# Patient Record
Sex: Female | Born: 1980 | Race: White | Hispanic: No | Marital: Married | Smoking: Never smoker
Health system: Southern US, Community
[De-identification: ages and names within clinical notes are randomized; demographics above are authoritative.]

## PROBLEM LIST (undated history)

## (undated) DIAGNOSIS — I10 Essential (primary) hypertension: Secondary | ICD-10-CM

## (undated) DIAGNOSIS — R29818 Other symptoms and signs involving the nervous system: Secondary | ICD-10-CM

## (undated) DIAGNOSIS — R519 Headache, unspecified: Secondary | ICD-10-CM

## (undated) DIAGNOSIS — N938 Other specified abnormal uterine and vaginal bleeding: Secondary | ICD-10-CM

## (undated) DIAGNOSIS — R51 Headache: Secondary | ICD-10-CM

## (undated) DIAGNOSIS — I639 Cerebral infarction, unspecified: Secondary | ICD-10-CM

## (undated) DIAGNOSIS — G43909 Migraine, unspecified, not intractable, without status migrainosus: Secondary | ICD-10-CM

## (undated) DIAGNOSIS — G890 Central pain syndrome: Secondary | ICD-10-CM

## (undated) HISTORY — PX: TUBAL LIGATION: SHX77

## (undated) HISTORY — DX: Headache: R51

## (undated) HISTORY — DX: Headache, unspecified: R51.9

---

## 2001-07-01 ENCOUNTER — Ambulatory Visit (HOSPITAL_COMMUNITY): Admission: RE | Admit: 2001-07-01 | Discharge: 2001-07-01 | Payer: Self-pay | Admitting: *Deleted

## 2001-07-01 ENCOUNTER — Encounter: Payer: Self-pay | Admitting: *Deleted

## 2001-10-12 ENCOUNTER — Ambulatory Visit (HOSPITAL_COMMUNITY): Admission: AD | Admit: 2001-10-12 | Discharge: 2001-10-12 | Payer: Self-pay | Admitting: *Deleted

## 2001-10-26 ENCOUNTER — Inpatient Hospital Stay (HOSPITAL_COMMUNITY): Admission: RE | Admit: 2001-10-26 | Discharge: 2001-10-28 | Payer: Self-pay | Admitting: *Deleted

## 2003-06-26 ENCOUNTER — Emergency Department (HOSPITAL_COMMUNITY): Admission: EM | Admit: 2003-06-26 | Discharge: 2003-06-26 | Payer: Self-pay | Admitting: *Deleted

## 2006-07-17 ENCOUNTER — Emergency Department (HOSPITAL_COMMUNITY): Admission: EM | Admit: 2006-07-17 | Discharge: 2006-07-17 | Payer: Self-pay | Admitting: Emergency Medicine

## 2006-12-28 ENCOUNTER — Emergency Department (HOSPITAL_COMMUNITY): Admission: EM | Admit: 2006-12-28 | Discharge: 2006-12-28 | Payer: Self-pay | Admitting: Emergency Medicine

## 2007-05-09 ENCOUNTER — Emergency Department (HOSPITAL_COMMUNITY): Admission: EM | Admit: 2007-05-09 | Discharge: 2007-05-09 | Payer: Self-pay | Admitting: Emergency Medicine

## 2008-10-31 ENCOUNTER — Emergency Department (HOSPITAL_COMMUNITY): Admission: EM | Admit: 2008-10-31 | Discharge: 2008-10-31 | Payer: Self-pay | Admitting: Emergency Medicine

## 2010-06-20 ENCOUNTER — Emergency Department (HOSPITAL_COMMUNITY)
Admission: EM | Admit: 2010-06-20 | Discharge: 2010-06-20 | Payer: Self-pay | Source: Home / Self Care | Admitting: Emergency Medicine

## 2010-06-28 ENCOUNTER — Emergency Department (HOSPITAL_COMMUNITY)
Admission: EM | Admit: 2010-06-28 | Discharge: 2010-06-28 | Payer: Self-pay | Source: Home / Self Care | Admitting: Emergency Medicine

## 2010-11-30 ENCOUNTER — Emergency Department (HOSPITAL_COMMUNITY)
Admission: EM | Admit: 2010-11-30 | Discharge: 2010-11-30 | Disposition: A | Discharge status: Home / Self Care | Payer: Self-pay | Attending: Emergency Medicine | Admitting: Emergency Medicine

## 2010-11-30 DIAGNOSIS — K089 Disorder of teeth and supporting structures, unspecified: Secondary | ICD-10-CM | POA: Insufficient documentation

## 2010-11-30 DIAGNOSIS — K029 Dental caries, unspecified: Secondary | ICD-10-CM | POA: Insufficient documentation

## 2010-12-07 NOTE — H&P (Signed)
[facility]  Patient:    Nicole Travis, Nicole Travis Visit Number: [id number] MRN: [id number]          Service Type: OBS Location: 4A A417 01 Attending Physician:  Jeri Cos. Dictated by:   Langley Gauss, M.D. Admit Date:  10/26/2001 Discharge Date: 10/28/2001                           History and Physical  CHIEF COMPLAINT:  This is a 30 year old gravida 2 para 1, at [redacted] weeks gestation, with previous low transverse cesarean section, who presents to [facility] complaining of rupture of membranes with clear amniotic fluid at 0600 and onset of uterine contractions shortly thereafter.  HISTORY OF PRESENT ILLNESS:  The patients prenatal course has otherwise been uncomplicated.  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  ALLERGIES:  No known drug allergies.  CURRENT MEDICATIONS:  Prenatal vitamins.  PAST OBSTETRICAL HISTORY:  Pertinent for July 23, 2000 failure to progress with primary low transverse cesarean section performed, delivered 6 pound 12 ounce infant.  The patient states that at that operative procedure the epidural was not functional.  Likewise, a spinal was not functional.  The patient required general anesthesia.  PAST MEDICAL HISTORY:  No other past medical history.  PHYSICAL EXAMINATION:  GENERAL:  Obese white female in moderate distress, with occurrence of uterine contractions.  VITAL SIGNS:  Height 5 feet 2 inches.  Weight 245 pounds.  Blood pressure 142/80, pulse rate 80, respiratory rate 20.  HEENT:  Negative.  Mucous membranes moist.  NECK:  No adenopathy.  Neck supple.  Thyroid not palpable.  LUNGS:  Clear.  CARDIOVASCULAR:  Regular rate and rhythm.  ABDOMEN:  Soft, nontender.  Fundal height 38 cm appreciated.  She is vertex presentation by Leopolds maneuver.  SKIN:  The  cutaneous Candidiasis is noted to be just underneath the panniculus with slight odor identified.  PELVIC:  Clear leakage of amniotic fluid noted.  Cervix noted to be 3 cm dilated.  Vertex, however, is not engaged.  External fetal monitor reveals contractions occurring q.16minutes, with a reassuring fetal heart rate and accelerations noted.  ASSESSMENT:  Intrauterine pregnancy at 39 weeks with prior low transverse cesarean section, now presenting with rupture of membranes and early stages of active labor.  PLAN:  The patient is at risk for uterine dehiscence and/or uterine rupture secondary to to the presence of the previous uterine incision.  Thus, the patient is treated initially with subcu terbutaline with only marginal results, with uterine contractions only being suppressed for 30 minutes duration.  Thus, with the return of onset of contractions and potential for uterine rupture, the decision was made to proceed with repeat cesarean section with intraoperative tubal ligation in emergent, though not stat, classification. Dictated by:   Langley Gauss, M.D. Attending Physician:  Jeri Cos. DD:  10/28/01 TD:  10/29/01 Job: [id number]

## 2010-12-07 NOTE — Discharge Summary (Signed)
[facility]  Patient:    Nicole Travis, Nicole Travis Visit Number: [id number] MRN: [id number]          Service Type: OBS Location: 4A A417 01 Attending Physician:  Jeri Cos. Dictated by:   Langley Gauss, M.D. Admit Date:  10/26/2001 Discharge Date: 10/28/2001                             Discharge Summary  DISCHARGE DIAGNOSIS:  A 39 week intrauterine pregnancy with previous low transverse cesarean section.  BRIEF HISTORY:  The patient presents with spontaneous rupture of membranes and early labor.  Intraoperative permanent sterilization was also performed by Dr. Roylene Reason. Lisette Grinder.  The patient desired permanent sterilization.  DISPOSITION:  The patient is to follow up in 1 days time for staple removal from a Pfannenstiel incision.  She is given copies of standardized discharge instructions.  She will likewise continue to use the Nizoral cream on a cutaneous Candidiasis at the site of the incision.  PERTINENT LABORATORY DATA:  Admission hemoglobin and hematocrit 11.2/31.6.  On postoperatively day #1 8.8/24.6.  HOSPITAL COURSE:  The patient had been scheduled for repeat C-section on October 28, 2001, however, she presented on October 26, 2001, with rupture of membranes and active contractions.  Due to the presence of a previous uterine scar and active contractions the patient was initially treated with subcutaneous terbutaline, however, this failed to provide any significant cessation of uterine activity.  After a 30 minute time period the contractions had recurred and returned to their previous frequency and intensity at about q.3-5 minutes and of moderate intensity.  Changes were thus required in the operating room schedule.  I contacted Dr. Franky Macho and advised again that we would like to progress with the surgical procedure of the repeat C-section prior to his cholecystectomy.  He was, however, advised that this is not to be considered a stat but  rather an emergent type procedure due to the presence of the uterine contractions.  The patient thus underwent repeat C-section on October 26, 2001.  The procedure was complicated by failure to achieve adequate spinal analgesic thus the patient was operated on under general anesthesia.  Postoperatively the patient did well.  Did have a JP catheter and a Foley catheter in place postoperatively.  She was able to advance her diet and tolerate a regular general diet and have normal resumption of bowel function. She remained afebrile.  Vital signs remained stable.  On postoperative day #2, October 28, 2001, the patient is now tolerating a regular general diet.  She does well with p.o. Tylox for pain relief.  She has had resumption of bowel and bladder function.  The JP drain is removed on the p.m. of October 28, 2001, with markedly decreased drainage over the past 48 hours.  The patient is noted to continue to have persistence of the cutaneous candidiasis at the site of the incision due to the overlying ______. The patient is discharged home on October 28, 2001.  An infant circumcision was likewise performed on Baby Boy Myrtie Soman on October 28, 2001. Dictated by:   Langley Gauss, M.D. Attending Physician:  Jeri Cos. DD:  10/28/01 TD:  10/29/01 Job: [id number]

## 2010-12-07 NOTE — Op Note (Signed)
[facility]  Patient:    Nicole Travis, Nicole Travis Visit Number: [id number] MRN: [id number]          Service Type: OBS Location: 4A A417 01 Attending Physician:  Jeri Cos. Dictated by:   Langley Gauss, M.D. Proc. Date: 10/26/01 Admit Date:  10/26/2001                             Operative Report  PREOPERATIVE DIAGNOSES: 1. A 39-week intrauterine pregnancy with spontaneous rupture of membranes and    labor. 2. Previous low transverse cesarean section. 3. The patient desires repeat cesarean section with permanent sterilization.  PROCEDURE PERFORMED: 1. Repeat low transverse cesarean section with delivery of an 8-pound    1.6-ounce female infant. 2. Intraoperative bilateral modified Pomeroy tubal ligation.  SURGEON:  Langley Gauss, M.D.  ESTIMATED BLOOD LOSS:  1000 cc.  ANALGESIA/ANESTHESIA:  Failed multiple attempts at spinal per the anesthesia staff.  The patient required induction of general endotracheal anesthesia.  SPECIMEN:  Arterial cord gas and cord blood to pathology.  The placenta was examined and noted to be apparently intact with a three-vessel umbilical cord.  PEDIATRICIAN:  Simone Curia, M.D.  SUMMARY:  The patient presented to [facility] with ruptured membranes in early stages of active labor with contractions occurring q.3 minutes.  The patient originally was scheduled for a repeat cesarean section on October 28, 2001.  I evaluated the patient at 0830.  She was noted to have spontaneous rupture of membranes, contracting q.3-5 minutes, dilated 3 cm.  Preparations were made to proceed with repeat cesarean section.  The patient was treated with 0.5 mg of subcu terbutaline.  This resolved the uterine contractions for only a short period of time.  After 30 minutes, the patient again had onset of painful uterine contractions.  I discussed the patient with the operating room coordinator, and was told that a start time would be  around 10 a.m.  After all the preparations were made and the room was available, the patient was taken to the operating room, at which time anesthesia unsuccessfully tried multiple attempts to place a spinal analgesic.  The patient was in the left lateral decubitus position.  She would not agree to the sitting up position for a trial of spinal placement.  The patient thus was advised that we were required to proceed with general anesthesia.  She was placed on the OR table,  right and left lateral tilts, prepped and draped in the usual sterile manner.  A sharp knife was used to incise the Pfannenstiel incision through the skin at the previous C-section site.  We progressed down to the fascial plane utilizing a sharp knife, cauterizing all bleeders along the way.  The fascia was then incised in a transverse curvilinear manner, utilizing the Mayo scissors while sharply dissecting the underlying rectus muscles.  The fascial edges were then grasped using straight Kocher clamps and the fascia separating off the underlying rectus muscle in the midline utilizing the Mayo scissors. Once this was done both superiorly and inferiorly, the rectus muscles were then bluntly separated.  The peritoneal cavity was atraumatically bluntly entered at the superior most portion of the incision.  The peritoneal incision was extended superiorly and inferiorly.  We directly visualized the bladder to avoid its accidental entry.  A bladder blade was then placed.  Lower uterine segment was identified.  A bladder flap was then created from the vesicle uterine fold  utilizing sharp dissection with the Mayo scissors.  Blunt dissection then resulted in a bladder flap being mobilized.  A sharp knife was then used to score a low transverse uterine incision, and the amniotic sac was encountered in the midline.  Clear amniotic fluid was noted.  My index finger was then used to bluntly extend the uterine incision bilaterally.   Clear amniotic fluid was noted.  My right hand then reached into the uterine cavity. The head of the infant was flexed and elevated.  The reusuable suction was then connected to wall suction.  I discussed this previously with the nursing staff that we would like this delivered in this manner.  The suction cup was then placed.  Gentle suction was applied.  However, this was unsuccessful in delivery, so as the suction had been improperly assembled, resulting in leakage.  Several attempts were repeated with different suctions being made, however, we were unsuccessful in getting this piece of operating equipment to work.  Therefore, I reached my right hand into the uterine cavity and the head of the infant was flexed and elevated.  Several attempts with fundal pressure from above.  The head was noted on its delivery atraumatically through the uterine incision without difficulty.  Mouth and nares were bulb suctioned of clear amniotic fluid.  Nuchal cord x1 was reduced.  Gentle traction resulted in delivery of the remaining infant through the incision without difficulty. The umbilical cord was then moved toward the infant.  The cord was doubly clamped and cut.  The infant was handed to the waiting pediatrician, Dr. Simone Curia.  There was noted to be a spontaneous and vigorous baby cry.  Arterial blood gas and cord blood were then from the umbilical cord. Gentle traction on the umbilical cord resulted in separation which appeared to be a very large intact three-vessel cord.  This was discarded.  The uterus then exteriorized.  Intrauterine exploration revealed no retained placental fragments.  The uterine incision was thus closed in two layers with #0 Chromic in a running-locked fashion, second layer beginning in imbricating layer. This resulted in excellent hemostasis.  The cul-de-sac was then irrigated.  We then began with a tubal ligation.  The tubes and ovaries were noted to be normal  bilaterally.  Each of the tubes was grasped in its midportion.  Two ties of #1 plain sutures were then placed at the base of the loop of tube  formed, which allowed sharp excision of a portion of right and left fallopian tubes.  A Polysorb suture was placed to the left fallopian tube for positive identification.  The pedicles from the tubal ligation were noted to be dry and secure.  The uterus was then returned to the pelvic cavity.  Sponge, needle, and instrument counts were correct at this time.  Peritoneal edges were grasped using Kelly clamps, and the peritoneum was closed with a continuous running #0 Chromic suture.  The rectus muscles likewise were reapproximated in the midline utilizing continuous running #0 Chromic suture.  No subfascial leaks were cauterized.  The fascia was then closed with a double-stranded looped #1 PDS suture.  The subcutaneous bleeders were cauterized.  A JP drain was placed in the subcutaneous space with a separate exit wound to the right apex of the incision.  This was sutured in place, three #1 Maxon sutures were then placed through and through the skin edges to facilitate reapproximation of the skin in the midline.  The skin was then completely closed utilizing skin  staples.  The patient tolerated the procedure well.  To facilitate postoperative analgesia a total of 30 cc of 0.5% bupivacaine was injected subcutaneously through a small skin wheal.  The patient tolerated this procedure very well.  She continued to drain clear yellow urine, and she was taken to the recovery room in stable condition at which time operative findings were discussed with the patients awaiting family. Dictated by:   Langley Gauss, M.D. Attending Physician:  Jeri Cos. DD:  10/26/01 TD:  10/27/01 Job: [id number]

## 2011-05-09 LAB — STREP A DNA PROBE: Group A Strep Probe: NEGATIVE

## 2011-10-01 ENCOUNTER — Other Ambulatory Visit (HOSPITAL_COMMUNITY)
Admission: RE | Admit: 2011-10-01 | Discharge: 2011-10-01 | Disposition: A | Discharge status: Home / Self Care | Payer: Self-pay | Source: Ambulatory Visit | Attending: Unknown Physician Specialty | Admitting: Unknown Physician Specialty

## 2011-10-01 ENCOUNTER — Other Ambulatory Visit: Payer: Self-pay | Admitting: Nurse Practitioner

## 2011-10-01 DIAGNOSIS — R87619 Unspecified abnormal cytological findings in specimens from cervix uteri: Secondary | ICD-10-CM | POA: Insufficient documentation

## 2011-10-01 DIAGNOSIS — R87610 Atypical squamous cells of undetermined significance on cytologic smear of cervix (ASC-US): Secondary | ICD-10-CM | POA: Insufficient documentation

## 2012-01-10 ENCOUNTER — Emergency Department (HOSPITAL_COMMUNITY)
Admission: EM | Admit: 2012-01-10 | Discharge: 2012-01-10 | Disposition: A | Discharge status: Home / Self Care | Payer: Self-pay | Attending: Emergency Medicine | Admitting: Emergency Medicine

## 2012-01-10 ENCOUNTER — Encounter (HOSPITAL_COMMUNITY): Payer: Self-pay | Admitting: *Deleted

## 2012-01-10 DIAGNOSIS — I10 Essential (primary) hypertension: Secondary | ICD-10-CM | POA: Insufficient documentation

## 2012-01-10 DIAGNOSIS — M791 Myalgia, unspecified site: Secondary | ICD-10-CM

## 2012-01-10 DIAGNOSIS — M25519 Pain in unspecified shoulder: Secondary | ICD-10-CM | POA: Insufficient documentation

## 2012-01-10 HISTORY — DX: Essential (primary) hypertension: I10

## 2012-01-10 MED ORDER — CYCLOBENZAPRINE HCL 10 MG PO TABS
10.0000 mg | ORAL_TABLET | Freq: Once | ORAL | Status: AC
  Administered 2012-01-10: 10 mg via ORAL
  Filled 2012-01-10: qty 1

## 2012-01-10 MED ORDER — CYCLOBENZAPRINE HCL 10 MG PO TABS: 10.0000 mg | ORAL_TABLET | Freq: Two times a day (BID) | ORAL | Status: AC | PRN

## 2012-01-10 MED ORDER — IBUPROFEN 800 MG PO TABS
800.0000 mg | ORAL_TABLET | Freq: Once | ORAL | Status: AC
  Administered 2012-01-10: 800 mg via ORAL
  Filled 2012-01-10: qty 1

## 2012-01-10 MED ORDER — IBUPROFEN 800 MG PO TABS: 800.0000 mg | ORAL_TABLET | Freq: Three times a day (TID) | ORAL | Status: AC

## 2012-01-10 NOTE — ED Notes (Signed)
Pain to back of right shoulder since Monday.  States pain has gotten worse and causes her side to seize up.

## 2012-01-10 NOTE — ED Provider Notes (Signed)
History     CSN: [id number]  Arrival date & time 01/10/12  4098   First MD Initiated Contact with Patient 01/10/12 0559      Chief Complaint  Patient presents with  . Shoulder Pain    (Consider location/radiation/quality/duration/timing/severity/associated sxs/prior treatment) HPI Nicole Travis is a 31 y.o. female who presents to the Emergency Department complaining of right sub scapular pain that she had when she woke up on Monday. She is not aware of any activity or injury. She has used aleve without relief. Past Medical History  Diagnosis Date  . Hypertension     Past Surgical History  Procedure Date  . Cesarean section   . Tubal ligation     History reviewed. No pertinent family history.  History  Substance Use Topics  . Smoking status: Never Smoker   . Smokeless tobacco: Not on file  . Alcohol Use: No    OB History    Grav Para Term Preterm Abortions TAB SAB Ect Mult Living                  Review of Systems  Constitutional: Negative for fever.       10 Systems reviewed and are negative for acute change except as noted in the HPI.  HENT: Negative for congestion.   Eyes: Negative for discharge and redness.  Respiratory: Negative for cough and shortness of breath.   Cardiovascular: Negative for chest pain.  Gastrointestinal: Negative for vomiting and abdominal pain.  Musculoskeletal: Negative for back pain.       Right upper back pain  Skin: Negative for rash.  Neurological: Negative for syncope, numbness and headaches.  Psychiatric/Behavioral:       No behavior change.    Allergies  Review of patient's allergies indicates no known allergies.  Home Medications   Current Outpatient Rx  Name Route Sig Dispense Refill  . LISINOPRIL 10 MG PO TABS Oral Take 10 mg by mouth daily.      BP 165/98  Pulse 57  Temp 98 F (36.7 C) (Oral)  Resp 20  Ht 5\' 1"  (1.549 m)  Wt 245 lb (111.131 kg)  BMI 46.29 kg/m2  SpO2 98%  LMP 12/09/2011  Physical  Exam  Nursing note and vitals reviewed. Constitutional:       Awake, alert, nontoxic appearance.  HENT:  Head: Atraumatic.  Eyes: Right eye exhibits no discharge. Left eye exhibits no discharge.  Neck: Neck supple.  Pulmonary/Chest: Effort normal. She exhibits no tenderness.  Abdominal: Soft. There is no tenderness. There is no rebound.  Musculoskeletal: She exhibits no tenderness.       Baseline ROM, no obvious new focal weakness.Focal tenderness to palpation at right sub scapular area.   Neurological:       Mental status and motor strength appears baseline for patient and situation.  Skin: No rash noted.  Psychiatric: She has a normal mood and affect.    ED Course  Procedures (including critical care time)     MDM  Patient with pain to the right sub scapular area since Monday with no known injury. Given antiinflammatory and muscle relaxant. Pt stable in ED with no significant deterioration in condition.The patient appears reasonably screened and/or stabilized for discharge and I doubt any other medical condition or other [facility] requiring further screening, evaluation, or treatment in the ED at this time prior to discharge.  MDM Reviewed: nursing note and vitals           Nicoletta Dress.  Colon Branch, MD 01/10/12 [phone]

## 2012-01-10 NOTE — Discharge Instructions (Signed)
Apply heat to the area for comfort. Use the ibuprofen and muscle relaxant.

## 2013-01-24 ENCOUNTER — Encounter (HOSPITAL_COMMUNITY): Payer: Self-pay | Admitting: *Deleted

## 2013-01-24 ENCOUNTER — Emergency Department (HOSPITAL_COMMUNITY): Payer: Self-pay

## 2013-01-24 ENCOUNTER — Emergency Department (HOSPITAL_COMMUNITY)
Admission: EM | Admit: 2013-01-24 | Discharge: 2013-01-24 | Disposition: A | Discharge status: Home / Self Care | Payer: Self-pay | Attending: Emergency Medicine | Admitting: Emergency Medicine

## 2013-01-24 DIAGNOSIS — I10 Essential (primary) hypertension: Secondary | ICD-10-CM | POA: Insufficient documentation

## 2013-01-24 DIAGNOSIS — R52 Pain, unspecified: Secondary | ICD-10-CM | POA: Insufficient documentation

## 2013-01-24 DIAGNOSIS — J069 Acute upper respiratory infection, unspecified: Secondary | ICD-10-CM | POA: Insufficient documentation

## 2013-01-24 DIAGNOSIS — R0602 Shortness of breath: Secondary | ICD-10-CM | POA: Insufficient documentation

## 2013-01-24 DIAGNOSIS — Z79899 Other long term (current) drug therapy: Secondary | ICD-10-CM | POA: Insufficient documentation

## 2013-01-24 DIAGNOSIS — J3489 Other specified disorders of nose and nasal sinuses: Secondary | ICD-10-CM | POA: Insufficient documentation

## 2013-01-24 DIAGNOSIS — R0789 Other chest pain: Secondary | ICD-10-CM | POA: Insufficient documentation

## 2013-01-24 DIAGNOSIS — J40 Bronchitis, not specified as acute or chronic: Secondary | ICD-10-CM | POA: Insufficient documentation

## 2013-01-24 DIAGNOSIS — M2550 Pain in unspecified joint: Secondary | ICD-10-CM | POA: Insufficient documentation

## 2013-01-24 MED ORDER — ALBUTEROL SULFATE HFA 108 (90 BASE) MCG/ACT IN AERS
2.0000 | INHALATION_SPRAY | Freq: Four times a day (QID) | RESPIRATORY_TRACT | Status: DC
  Administered 2013-01-24: 2 via RESPIRATORY_TRACT
  Filled 2013-01-24: qty 6.7

## 2013-01-24 MED ORDER — MUCINEX DM 30-600 MG PO TB12: 1.0000 | ORAL_TABLET | Freq: Two times a day (BID) | ORAL | Status: DC

## 2013-01-24 NOTE — ED Notes (Signed)
MD at bedside. 

## 2013-01-24 NOTE — ED Notes (Signed)
Pt with difficulty breathing for last several days per pt, + NP cough, + CP heaviness, denies N/V, mid CP

## 2013-01-24 NOTE — ED Provider Notes (Addendum)
History    This chart was scribed for Shelda Jakes, MD, MD by Ashley Jacobs, ED Scribe. The patient was seen in room APA15/APA15 and the patient's care was started at 10:27 pm CSN: [id number] Arrival date & time 01/24/13  2045     Chief Complaint  Patient presents with  . Shortness of Breath  . Cough    The history is provided by the patient and medical records. No language interpreter was used.   HPI Comments: Nicole Travis is a 32 y.o. female who presents to the Emergency Department complaining of moderate chest tightness and sob with onset of 2 days PTA. Pt is also experiencing a NP intermittent cough. Pt reports taking tylenol sinus while at home but have no relief.  Nothing seems to worsen or relive pain. Pt had sick contact with sick infant daughter. Pt denies nausea and vomiting       Past Medical History  Diagnosis Date  . Hypertension    Past Surgical History  Procedure Laterality Date  . Cesarean section    . Tubal ligation     History reviewed. No pertinent family history. History  Substance Use Topics  . Smoking status: Never Smoker   . Smokeless tobacco: Not on file  . Alcohol Use: No   OB History   Grav Para Term Preterm Abortions TAB SAB Ect Mult Living                 Review of Systems  Constitutional: Negative for fever and chills.  HENT: Positive for congestion (started 2 hrs at arrival). Negative for sore throat, rhinorrhea, trouble swallowing and neck pain.   Respiratory: Positive for cough, chest tightness and shortness of breath.   Cardiovascular: Negative for chest pain.  Gastrointestinal: Negative for nausea, vomiting, abdominal pain, diarrhea, constipation and blood in stool.  Genitourinary: Negative for dysuria, hematuria and difficulty urinating.  Musculoskeletal: Positive for arthralgias.       Body aches  Skin: Negative for rash.    Allergies  Review of patient's allergies indicates no known allergies.  Home Medications    Current Outpatient Rx  Name  Route  Sig  Dispense  Refill  . pseudoephedrine-acetaminophen (TYLENOL SINUS) 30-500 MG TABS   Oral   Take 2 tablets by mouth every 6 (six) hours as needed. Cold/sinus         . Dextromethorphan-Guaifenesin (MUCINEX DM) 30-600 MG TB12   Oral   Take 1 tablet by mouth every 12 (twelve) hours.   28 each   0   . lisinopril (PRINIVIL,ZESTRIL) 10 MG tablet   Oral   Take 10 mg by mouth daily.          BP 154/104  Pulse 105  Temp(Src) 98.2 F (36.8 C) (Oral)  Resp 22  Ht 5\' 1"  (1.549 m)  Wt 245 lb (111.131 kg)  BMI 46.32 kg/m2  SpO2 98%  LMP 01/10/2013 Physical Exam  Nursing note and vitals reviewed. Constitutional: She is oriented to person, place, and time. She appears well-developed and well-nourished. No distress.  HENT:  Head: Normocephalic and atraumatic.  Right Ear: External ear normal.  Left Ear: External ear normal.  Mouth/Throat: Oropharynx is clear and moist.  Eyes: Conjunctivae and EOM are normal. Pupils are equal, round, and reactive to light.  Neck: Normal range of motion. Neck supple.  Cardiovascular: Normal rate, regular rhythm and normal heart sounds.   No murmur heard. Pulmonary/Chest: Effort normal and breath sounds normal. She has no  wheezes.  No rhonchi  Abdominal: Bowel sounds are normal. There is no tenderness.  Musculoskeletal: Normal range of motion. She exhibits no edema.  Lymphadenopathy:    She has no cervical adenopathy.  Neurological: She is alert and oriented to person, place, and time. No cranial nerve deficit. Coordination normal.  Skin: Skin is warm. No rash noted. She is not diaphoretic.  Psychiatric: She has a normal mood and affect. Her behavior is normal.    ED Course  Procedures (including critical care time) DIAGNOSTIC STUDIES: Oxygen Saturation is 98% on room air, normal by my interpretation.    COORDINATION OF CARE: 10:30 Discussed course of care with pt which includes DG chest views, ekg.  Pt understands and agrees.  Labs Reviewed - No data to display Dg Chest 2 View  01/24/2013   *RADIOLOGY REPORT*  Clinical Data: Shortness of breath, cough, congestion.  CHEST - 2 VIEW  Comparison: 05/09/2007  Findings: Heart and mediastinal contours are within normal limits. No focal opacities or effusions.  No acute bony abnormality.  IMPRESSION: No active cardiopulmonary disease.   Original Report Authenticated By: Charlett Nose, M.D.    Date: 01/24/2013  Rate: 102  Rhythm: sinus tachycardia  QRS Axis: normal  Intervals: normal  ST/T Wave abnormalities: normal  Conduction Disutrbances:none  Narrative Interpretation:   Old EKG Reviewed: none available      1. URI (upper respiratory infection)   2. Bronchitis     MDM  Patient no acute distress. Sig exposure home to child. Symptoms seem to be consistent with a viral upper respiratory infection with bronchitis. Chest x-rays negative for pneumonia. No wheezing present. We'll treat with albuterol inhaler and Mucinex DM.    I personally performed the services described in this documentation, which was scribed in my presence. The recorded information has been reviewed and is accurate.     Shelda Jakes, MD 01/24/13 2250  Shelda Jakes, MD 01/24/13 2251

## 2014-07-18 ENCOUNTER — Emergency Department (HOSPITAL_COMMUNITY): Payer: Commercial Managed Care - PPO

## 2014-07-18 ENCOUNTER — Encounter (HOSPITAL_COMMUNITY): Payer: Self-pay | Admitting: Emergency Medicine

## 2014-07-18 ENCOUNTER — Emergency Department (HOSPITAL_COMMUNITY)
Admission: EM | Admit: 2014-07-18 | Discharge: 2014-07-18 | Disposition: A | Discharge status: Home / Self Care | Payer: Commercial Managed Care - PPO | Attending: Emergency Medicine | Admitting: Emergency Medicine

## 2014-07-18 DIAGNOSIS — R000 Tachycardia, unspecified: Secondary | ICD-10-CM | POA: Diagnosis not present

## 2014-07-18 DIAGNOSIS — R6889 Other general symptoms and signs: Secondary | ICD-10-CM

## 2014-07-18 DIAGNOSIS — R509 Fever, unspecified: Secondary | ICD-10-CM | POA: Insufficient documentation

## 2014-07-18 DIAGNOSIS — R51 Headache: Secondary | ICD-10-CM | POA: Diagnosis not present

## 2014-07-18 DIAGNOSIS — I10 Essential (primary) hypertension: Secondary | ICD-10-CM | POA: Insufficient documentation

## 2014-07-18 DIAGNOSIS — R05 Cough: Secondary | ICD-10-CM | POA: Insufficient documentation

## 2014-07-18 DIAGNOSIS — R0981 Nasal congestion: Secondary | ICD-10-CM | POA: Diagnosis not present

## 2014-07-18 DIAGNOSIS — R5383 Other fatigue: Secondary | ICD-10-CM | POA: Insufficient documentation

## 2014-07-18 DIAGNOSIS — R0602 Shortness of breath: Secondary | ICD-10-CM | POA: Diagnosis present

## 2014-07-18 DIAGNOSIS — M791 Myalgia: Secondary | ICD-10-CM | POA: Insufficient documentation

## 2014-07-18 LAB — CBC
HCT: 42.3 % (ref 36.0–46.0)
Hemoglobin: 14 g/dL (ref 12.0–15.0)
MCH: 30.2 pg (ref 26.0–34.0)
MCHC: 33.1 g/dL (ref 30.0–36.0)
MCV: 91.4 fL (ref 78.0–100.0)
PLATELETS: 265 10*3/uL (ref 150–400)
RBC: 4.63 MIL/uL (ref 3.87–5.11)
RDW: 14.1 % (ref 11.5–15.5)
WBC: 9.1 10*3/uL (ref 4.0–10.5)

## 2014-07-18 LAB — COMPREHENSIVE METABOLIC PANEL
ALT: 19 U/L (ref 0–35)
AST: 23 U/L (ref 0–37)
Albumin: 4 g/dL (ref 3.5–5.2)
Alkaline Phosphatase: 78 U/L (ref 39–117)
Anion gap: 6 (ref 5–15)
BUN: 11 mg/dL (ref 6–23)
CALCIUM: 9.2 mg/dL (ref 8.4–10.5)
CO2: 30 mmol/L (ref 19–32)
Chloride: 103 mEq/L (ref 96–112)
Creatinine, Ser: 0.81 mg/dL (ref 0.50–1.10)
GFR calc Af Amer: >90 mL/min (ref >90)
Glucose, Bld: 107 mg/dL — ABNORMAL HIGH (ref 70–99)
Potassium: 3.8 mmol/L (ref 3.5–5.1)
SODIUM: 139 mmol/L (ref 135–145)
Total Bilirubin: 0.6 mg/dL (ref 0.3–1.2)
Total Protein: 7.8 g/dL (ref 6.0–8.3)

## 2014-07-18 MED ORDER — NAPROXEN 500 MG PO TABS: 500.0000 mg | ORAL_TABLET | Freq: Two times a day (BID) | ORAL | Status: DC

## 2014-07-18 MED ORDER — DM-GUAIFENESIN ER 30-600 MG PO TB12: 1.0000 | ORAL_TABLET | Freq: Two times a day (BID) | ORAL | Status: DC

## 2014-07-18 NOTE — ED Provider Notes (Signed)
CSN: [id number]     Arrival date & time 07/18/14  1447 History   First MD Initiated Contact with Patient 07/18/14 1819     Chief Complaint  Patient presents with  . Shortness of Breath     (Consider location/radiation/quality/duration/timing/severity/associated sxs/prior Treatment) Patient is a [age] y.o. female presenting with shortness of breath. The history is provided by the patient.  Shortness of Breath Associated symptoms: cough, fever and headaches   Associated symptoms: no abdominal pain, no chest pain, no rash, no sore throat and no vomiting    patient with onset last night of headache bodyaches fever chills cough now occasionally productive. No sore throat. Some headache but not severe. Shortness of breath worse with exertion. No nausea vomiting or diarrhea. No abdominal pain. No chest pain. No significant neck soreness or stiffness.  Past Medical History  Diagnosis Date  . Hypertension    Past Surgical History  Procedure Laterality Date  . Cesarean section    . Tubal ligation     History reviewed. No pertinent family history. History  Substance Use Topics  . Smoking status: Never Smoker   . Smokeless tobacco: Not on file  . Alcohol Use: No   OB History    No data available     Review of Systems  Constitutional: Positive for fever, chills and fatigue.  HENT: Positive for congestion. Negative for sore throat.   Eyes: Negative for visual disturbance.  Respiratory: Positive for cough and shortness of breath.   Cardiovascular: Negative for chest pain.  Gastrointestinal: Negative for nausea, vomiting, abdominal pain and diarrhea.  Genitourinary: Negative for dysuria.  Musculoskeletal: Positive for myalgias.  Skin: Negative for rash.  Neurological: Positive for headaches.  Hematological: Does not bruise/bleed easily.  Psychiatric/Behavioral: Negative for confusion.      Allergies  Review of patient's allergies indicates no known allergies.  Home Medications    Prior to Admission medications   Medication Sig Start Date End Date Taking? Authorizing Provider  ibuprofen (ADVIL,MOTRIN) 200 MG tablet Take 400 mg by mouth every 6 (six) hours as needed for mild pain or moderate pain.   Yes Historical Provider, MD  dextromethorphan-guaiFENesin (MUCINEX DM) 30-600 MG per 12 hr tablet Take 1 tablet by mouth 2 (two) times daily. 07/18/14   Vanetta MuldersScott Tanish Prien, MD  naproxen (NAPROSYN) 500 MG tablet Take 1 tablet (500 mg total) by mouth 2 (two) times daily. 07/18/14   Vanetta MuldersScott Marcin Holte, MD   BP 170/115 mmHg  Pulse 103  Temp(Src) 99 F (37.2 C) (Oral)  Resp 30  Ht 5' (1.524 m)  Wt 225 lb (102.059 kg)  BMI 43.94 kg/m2  SpO2 94%  LMP 06/10/2014 Physical Exam  Constitutional: She is oriented to person, place, and time. She appears well-developed and well-nourished. No distress.  HENT:  Head: Normocephalic and atraumatic.  Mouth/Throat: Oropharynx is clear and moist.  Eyes: Conjunctivae and EOM are normal. Pupils are equal, round, and reactive to light.  Neck: Normal range of motion. Neck supple.  Cardiovascular: Regular rhythm and normal heart sounds.   No murmur heard. Tachycardic  Pulmonary/Chest: Effort normal and breath sounds normal. No respiratory distress. She has no wheezes. She has no rales.  Abdominal: Soft. Bowel sounds are normal. There is no tenderness.  Musculoskeletal: Normal range of motion. She exhibits no edema.  Neurological: She is alert and oriented to person, place, and time. No cranial nerve deficit. She exhibits normal muscle tone. Coordination normal.  Skin: Skin is warm. No rash noted.  Nursing note  and vitals reviewed.   ED Course  Procedures (including critical care time) Labs Review Labs Reviewed  COMPREHENSIVE METABOLIC PANEL - Abnormal; Notable for the following:    Glucose, Bld 107 (*)    All other components within normal limits  CBC    Imaging Review Dg Chest 2 View  07/18/2014   CLINICAL DATA:  Short of breath.   Headache and cough.  EXAM: CHEST  2 VIEW  COMPARISON:  01/24/2013.  FINDINGS: Cardiopericardial silhouette within normal limits. Mediastinal contours normal. Trachea midline. No airspace disease or effusion. Study is mildly degraded by obese body habitus.  IMPRESSION: No active cardiopulmonary disease.   Electronically Signed   By: Andreas NewportGeoffrey  Lamke M.D.   On: 07/18/2014 15:24     EKG Interpretation   Date/Time:  Monday July 18 2014 15:00:08 EST Ventricular Rate:  102 PR Interval:  195 QRS Duration: 91 QT Interval:  323 QTC Calculation: 421 R Axis:   77 Text Interpretation:  Sinus tachycardia Confirmed by Khylin Gutridge  MD, Kenton Fortin  ([id number]) on 07/18/2014 7:21:03 PM      MDM   Final diagnoses:  Flu-like symptoms    Patient is nontoxic no acute distress. Patient is a little tachycardic. Not hypoxic. Patient alert. Symptoms seem to be consistent with a significant upper respiratory infection or flulike viral illness. Patient with some congestion bodyaches fever chills no nausea vomiting or diarrhea. Chest x-rays negative for pneumonia. No leukocytosis. Onset of symptoms was just last night.  Offered the patient to receive IV fluids here but she refuses want to go home and hydrate herself. Patient begin prescription for Mucinex DM and Naprosyn. Edition she was told she can take over-the-counter cold and flu multisymptom medicines. I cannot take Mucinex DM with that. Patient will return for any new or worse symptoms.    Vanetta MuldersScott Nishaan Stanke, MD 07/18/14 2003

## 2014-07-18 NOTE — Discharge Instructions (Signed)
As we discussed seems to be flulike viral type illness symptoms. No evidence of pneumonia. Drink plenty of fluids recommend fluids with sugar. Bland diet. Take Mucinex DM as directed and take Naprosyn as directed. Can also take the over-the-counter cold and flu multisymptom medicine. But then cannot take Mucinex DM with it. Return for any new or worse symptoms.

## 2014-07-18 NOTE — ED Notes (Signed)
Pt reports sob,headache and cough since last night. Pt reports last known fever last night. nad noted. Dyspnea noted with exertion.

## 2014-10-18 ENCOUNTER — Emergency Department (HOSPITAL_COMMUNITY): Payer: Commercial Managed Care - PPO

## 2014-10-18 ENCOUNTER — Emergency Department (HOSPITAL_COMMUNITY)
Admission: EM | Admit: 2014-10-18 | Discharge: 2014-10-18 | Disposition: A | Discharge status: Home / Self Care | Payer: Commercial Managed Care - PPO | Attending: Emergency Medicine | Admitting: Emergency Medicine

## 2014-10-18 ENCOUNTER — Encounter (HOSPITAL_COMMUNITY): Payer: Self-pay | Admitting: Emergency Medicine

## 2014-10-18 DIAGNOSIS — I10 Essential (primary) hypertension: Secondary | ICD-10-CM | POA: Insufficient documentation

## 2014-10-18 DIAGNOSIS — R202 Paresthesia of skin: Secondary | ICD-10-CM | POA: Diagnosis not present

## 2014-10-18 DIAGNOSIS — Z3202 Encounter for pregnancy test, result negative: Secondary | ICD-10-CM | POA: Insufficient documentation

## 2014-10-18 DIAGNOSIS — R200 Anesthesia of skin: Secondary | ICD-10-CM | POA: Diagnosis present

## 2014-10-18 DIAGNOSIS — Z79899 Other long term (current) drug therapy: Secondary | ICD-10-CM | POA: Diagnosis not present

## 2014-10-18 LAB — I-STAT CHEM 8, ED
BUN: 9 mg/dL (ref 6–23)
CALCIUM ION: 1.12 mmol/L (ref 1.12–1.23)
CREATININE: 0.8 mg/dL (ref 0.50–1.10)
Chloride: 100 mmol/L (ref 96–112)
GLUCOSE: 104 mg/dL — AB (ref 70–99)
HCT: 42 % (ref 36.0–46.0)
HEMOGLOBIN: 14.3 g/dL (ref 12.0–15.0)
POTASSIUM: 3.7 mmol/L (ref 3.5–5.1)
SODIUM: 144 mmol/L (ref 135–145)
TCO2: 24 mmol/L (ref 0–100)

## 2014-10-18 LAB — COMPREHENSIVE METABOLIC PANEL
ALT: 26 U/L (ref 0–35)
ANION GAP: 7 (ref 5–15)
AST: 27 U/L (ref 0–37)
Albumin: 3.9 g/dL (ref 3.5–5.2)
Alkaline Phosphatase: 74 U/L (ref 39–117)
BUN: 10 mg/dL (ref 6–23)
CHLORIDE: 103 mmol/L (ref 96–112)
CO2: 31 mmol/L (ref 19–32)
Calcium: 9.1 mg/dL (ref 8.4–10.5)
Creatinine, Ser: 0.74 mg/dL (ref 0.50–1.10)
GFR calc Af Amer: >90 mL/min (ref >90)
GFR calc non Af Amer: >90 mL/min (ref >90)
GLUCOSE: 105 mg/dL — AB (ref 70–99)
Potassium: 4 mmol/L (ref 3.5–5.1)
Sodium: 141 mmol/L (ref 135–145)
TOTAL PROTEIN: 7.5 g/dL (ref 6.0–8.3)
Total Bilirubin: 0.8 mg/dL (ref 0.3–1.2)

## 2014-10-18 LAB — I-STAT TROPONIN, ED: Troponin i, poc: 0 ng/mL (ref 0.00–0.08)

## 2014-10-18 LAB — CBC
HCT: 41 % (ref 36.0–46.0)
HEMOGLOBIN: 13.5 g/dL (ref 12.0–15.0)
MCH: 30.4 pg (ref 26.0–34.0)
MCHC: 32.9 g/dL (ref 30.0–36.0)
MCV: 92.3 fL (ref 78.0–100.0)
PLATELETS: 303 10*3/uL (ref 150–400)
RBC: 4.44 MIL/uL (ref 3.87–5.11)
RDW: 13.1 % (ref 11.5–15.5)
WBC: 7.8 10*3/uL (ref 4.0–10.5)

## 2014-10-18 LAB — DIFFERENTIAL
BASOS ABS: 0 10*3/uL (ref 0.0–0.1)
Basophils Relative: 0 % (ref 0–1)
Eosinophils Absolute: 0.2 10*3/uL (ref 0.0–0.7)
Eosinophils Relative: 2 % (ref 0–5)
Lymphocytes Relative: 31 % (ref 12–46)
Lymphs Abs: 2.4 10*3/uL (ref 0.7–4.0)
Monocytes Absolute: 0.5 10*3/uL (ref 0.1–1.0)
Monocytes Relative: 6 % (ref 3–12)
NEUTROS PCT: 60 % (ref 43–77)
Neutro Abs: 4.7 10*3/uL (ref 1.7–7.7)

## 2014-10-18 LAB — PROTIME-INR
INR: 0.98 (ref 0.00–1.49)
Prothrombin Time: 13 seconds (ref 11.6–15.2)

## 2014-10-18 LAB — PREGNANCY, URINE: Preg Test, Ur: NEGATIVE

## 2014-10-18 LAB — RAPID URINE DRUG SCREEN, HOSP PERFORMED
Amphetamines: NOT DETECTED
BARBITURATES: NOT DETECTED
Benzodiazepines: NOT DETECTED
Cocaine: NOT DETECTED
Opiates: NOT DETECTED
TETRAHYDROCANNABINOL: NOT DETECTED

## 2014-10-18 LAB — ETHANOL: Alcohol, Ethyl (B): <5 mg/dL (ref 0–9)

## 2014-10-18 LAB — APTT: APTT: 30 s (ref 24–37)

## 2014-10-18 MED ORDER — IOHEXOL 350 MG/ML SOLN
80.0000 mL | Freq: Once | INTRAVENOUS | Status: AC | PRN
  Administered 2014-10-18: 80 mL via INTRAVENOUS

## 2014-10-18 MED ORDER — LISINOPRIL 10 MG PO TABS
10.0000 mg | ORAL_TABLET | Freq: Every day | ORAL | Status: DC
  Administered 2014-10-18: 10 mg via ORAL
  Filled 2014-10-18: qty 1

## 2014-10-18 MED ORDER — LISINOPRIL-HYDROCHLOROTHIAZIDE 10-12.5 MG PO TABS: 1.0000 | ORAL_TABLET | Freq: Every day | ORAL | Status: DC

## 2014-10-18 MED ORDER — ASPIRIN 81 MG PO CHEW: 81.0000 mg | CHEWABLE_TABLET | Freq: Every day | ORAL | Status: DC

## 2014-10-18 NOTE — ED Notes (Signed)
Patient ambulatory to restroom, steady gait.

## 2014-10-18 NOTE — ED Provider Notes (Signed)
CSN: [id number]     Arrival date & time 10/18/14  1112 History  This chart was scribed for No att. providers found by Tonye RoyaltyJoshua Chen, ED Scribe. This patient was seen in room [facility] and the patient's care was started at 11:30 AM.    Chief Complaint  Patient presents with  . Numbness   The history is provided by the patient. No language interpreter was used.    HPI Comments: Nicole Travis is a 34 y.o. female who presents to the Emergency Department complaining of intermittent numbness to her right side from her shoulder and arm down to her foot with onset 2 days ago, worsening progressively. She states it initially lasted seconds at a time and now lasts minutes. She states it happened only once yesterday but has been constant since this morning. She reports some weakness on her left side, mild lightheadedness, and mild headache. She reports having similar symptoms once previously, years before, but resolved spontaneously and was not evaluated. She denies medical history but notes grandmother had history of CVA; she denies FHx of multiple sclerosis. She states she used to take HTN medication but is no longer prescribed any. She states she has history of migraines but denies any recently, she  She denies speech difficulty except some stutters which she attributes to anxiety. She denies chest pain or SOB.  Past Medical History  Diagnosis Date  . Hypertension    Past Surgical History  Procedure Laterality Date  . Cesarean section    . Tubal ligation     Family History  Problem Relation Age of Onset  . Stroke Maternal Grandmother   . Heart attack Brother   . Heart attack Maternal Grandmother    History  Substance Use Topics  . Smoking status: Never Smoker   . Smokeless tobacco: Never Used  . Alcohol Use: No   OB History    Gravida Para Term Preterm AB TAB SAB Ectopic Multiple Living   2 2 2       2      Review of Systems A complete 10 system review of systems was obtained and  all systems are negative except as noted in the HPI and PMH.    Allergies  Review of patient's allergies indicates no known allergies.  Home Medications   Prior to Admission medications   Medication Sig Start Date End Date Taking? Authorizing Provider  ibuprofen (ADVIL,MOTRIN) 200 MG tablet Take 400 mg by mouth every 6 (six) hours as needed for mild pain or moderate pain.   Yes Historical Provider, MD  aspirin 81 MG chewable tablet Chew 1 tablet (81 mg total) by mouth daily. 10/18/14   Glynn OctaveStephen Analyah Mcconnon, MD  dextromethorphan-guaiFENesin [facility](MUCINEX DM) 30-600 MG per 12 hr tablet Take 1 tablet by mouth 2 (two) times daily. Patient not taking: Reported on 10/18/2014 07/18/14   Vanetta MuldersScott Zackowski, MD  lisinopril-hydrochlorothiazide (ZESTORETIC) 10-12.5 MG per tablet Take 1 tablet by mouth daily. 10/18/14   Glynn OctaveStephen Zenobia Kuennen, MD  naproxen (NAPROSYN) 500 MG tablet Take 1 tablet (500 mg total) by mouth 2 (two) times daily. Patient not taking: Reported on 10/18/2014 07/18/14   Vanetta MuldersScott Zackowski, MD   BP 156/88 mmHg  Pulse 76  Temp(Src) 98 F (36.7 C) (Oral)  Resp 15  Ht 5' (1.524 m)  Wt 245 lb (111.131 kg)  BMI 47.85 kg/m2  SpO2 98%  LMP 09/04/2014 Physical Exam  Constitutional: She is oriented to person, place, and time. She appears well-developed and well-nourished. No distress.  HENT:  Head: Normocephalic and atraumatic.  Mouth/Throat: Oropharynx is clear and moist. No oropharyngeal exudate.  Eyes: Conjunctivae and EOM are normal. Pupils are equal, round, and reactive to light.  Neck: Normal range of motion. Neck supple.  No meningismus.  Cardiovascular: Normal rate, regular rhythm, normal heart sounds and intact distal pulses.   No murmur heard. Pulmonary/Chest: Effort normal and breath sounds normal. No respiratory distress.  Abdominal: Soft. There is no tenderness. There is no rebound and no guarding.  Musculoskeletal: Normal range of motion. She exhibits no edema or tenderness.  Neurological:  She is alert and oriented to person, place, and time. No cranial nerve deficit. She exhibits normal muscle tone. Coordination normal.  No ataxia on finger to nose bilaterally. No pronator drift. 5/5 strength throughout. CN 2-12 intact. Equal grip strength. Sensation intact.  subjective paresthesias right arm and right leg  Skin: Skin is warm.  Psychiatric: She has a normal mood and affect. Her behavior is normal.  Nursing note and vitals reviewed.   ED Course  Procedures (including critical care time)  DIAGNOSTIC STUDIES: Oxygen Saturation is 99% on room air, normal by my interpretation.    COORDINATION OF CARE: 11:40 AM Discussed treatment plan with patient at beside, including CT scan and consultation with neurology. The patient agrees with the plan and has no further questions at this time.   Labs Review Labs Reviewed  COMPREHENSIVE METABOLIC PANEL - Abnormal; Notable for the following:    Glucose, Bld 105 (*)    All other components within normal limits  I-STAT CHEM 8, ED - Abnormal; Notable for the following:    Glucose, Bld 104 (*)    All other components within normal limits  ETHANOL  PROTIME-INR  APTT  CBC  DIFFERENTIAL  URINE RAPID DRUG SCREEN (HOSP PERFORMED)  PREGNANCY, URINE  URINALYSIS, ROUTINE W REFLEX MICROSCOPIC  I-STAT TROPOININ, ED  I-STAT TROPOININ, ED  CBG MONITORING, ED    Imaging Review Ct Head Wo Contrast  10/18/2014   CLINICAL DATA:  34 year old hypertensive female presenting with intermittent right-sided numbness for 2 days getting worse. Some left-sided numbness. Initial encounter.  EXAM: CT HEAD WITHOUT CONTRAST  TECHNIQUE: Contiguous axial images were obtained from the base of the skull through the vertex without intravenous contrast.  COMPARISON:  07/17/2006  FINDINGS: No intracranial hemorrhage.  No CT evidence of large acute infarct. If infarct or demyelinating process were of high clinical concern, MR imaging would prove more sensitive for  detection of these findings.  Left cerebellar tonsil minimally low lying but within range of normal limits and unchanged.  Partially empty sella stable.  Orbital structures unremarkable.  No hydrocephalus.  No intracranial mass lesion noted on this unenhanced exam.  Mastoid air cells, middle ear cavities and visualized paranasal sinuses are clear.  IMPRESSION: No intracranial hemorrhage.  No CT evidence of large acute infarct. If infarct or demyelinating process were of high clinical concern, MR imaging would prove more sensitive for detection of these findings.   Electronically Signed   By: Lacy Duverney M.D.   On: 10/18/2014 12:36   Ct Angio Neck W/cm &/or Wo/cm  10/18/2014   CLINICAL DATA:  Stroke workup.  Reportedly there is no neck pain.  EXAM: CT ANGIOGRAPHY NECK  TECHNIQUE: Multidetector CT imaging of the neck was performed using the standard protocol during bolus administration of intravenous contrast. Multiplanar CT image reconstructions and MIPs were obtained to evaluate the vascular anatomy. Carotid stenosis measurements (when applicable) are obtained utilizing NASCET criteria, using  the distal internal carotid diameter as the denominator.  CONTRAST:  80mL OMNIPAQUE IOHEXOL 350 MG/ML SOLN  COMPARISON:  None.  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 Given absence of neck pain and low concern for dissection, patient young age and large dose, repeat imaging will not be performed at this time.  Aortic arch: Nondiagnostic  Right carotid system: Visible cervical portions are normal, with adequate visualization beginning at the level of the distal common carotid artery. No evidence of atherosclerosis or dissection. There ismoderate right supra clinoid ICA of uncertain cause. Intracranial vessels better visualized  on dedicated MRA.  Left carotid system: Visible portions are normal, with adequate visualization beginning at the level of the distal common carotid artery. No evidence of atherosclerosis or dissection.  Vertebral arteries:Poor visualization of the V1 and V2 segments. The right vertebral artery is hypoplastic and mainly serves the PICA. The right V4 segment beyond the PICA is not clearly enhancing on this study, but is patent and continuous to the basilar based on contemporaneous MRA, and the CTA findings are likely technical.  IMPRESSION: 1. Nondiagnostic CTA from the arch to mandibular angle due to patient size and contrast timing. 2. The hypoplastic right V4 segment is poorly visualized, but patent based on contemporaneous MRA. 3. Visible cervical carotids are widely patent. 4. Moderate right supraclinoid ICA narrowing. Reference intracranial MRA.   Electronically Signed   By: Marnee Spring M.D.   On: 10/18/2014 14:18   Mr Maxine Glenn Head Wo Contrast  10/18/2014   CLINICAL DATA:  Worsening right-sided numbness.  EXAM: MRI HEAD WITHOUT CONTRAST  MRA HEAD WITHOUT CONTRAST  TECHNIQUE: Multiplanar, multiecho pulse sequences of the brain and surrounding structures were obtained without intravenous contrast. Angiographic images of the head were obtained using MRA technique without contrast.  COMPARISON:  Head CT 10/18/2014  FINDINGS: MRI HEAD FINDINGS  The examination had to be discontinued prior to completion due to patient's inability to tolerate being in the scanner. Only sagittal T1, axial diffusion, and coronal diffusion weighted images were obtained.  There is no evidence of acute or subacute infarct. Ventricles and sulci are normal in size for age. No gross intracranial hemorrhage is identified. There is no midline shift.  MRA HEAD FINDINGS  The visualized distal left vertebral artery is patent and dominant without stenosis. The distal right vertebral artery is diffusely hypoplastic with its V4 segment not well  evaluated. The left PICA origin is patent. AICA origins are patent. Left SCA origin is patent. Right SCA appears patent, however its origin is not well evaluated. There is mild, less than 50% narrowing of the distal basilar artery just proximal to the SCA origins. There are patent posterior communicating arteries, left larger than right. There is suggestion of an at least moderate focal distal left P1 segment stenosis, however this may be technical as the narrowing is not as pronounced on concurrent CTA. There is mild narrowing of the distal left P2 segment, and there is mild-to-moderate distal PCA branch vessel irregularity bilaterally.  Internal carotid arteries are patent from skullbase to carotid termini. There is mild narrowing of the right supraclinoid ICA. The right A1 segment is hypoplastic. There is a patent anterior communicating artery. No significant ACA stenosis is identified. MCAs are unremarkable aside from mild branch vessel irregularity bilaterally. No intracranial aneurysm is identified.  IMPRESSION: 1. Incomplete  examination as above.  No acute infarct. 2. Mild right supraclinoid ICA stenosis, minimal narrowing of the distal basilar artery, and at least mild narrowing of the left P1 and P2 segments with anterior and posterior circulation branch vessel irregularity as above. Early atherosclerosis and vasculitis are considerations. 3. Hypoplastic right vertebral artery. Patent and dominant distal left vertebral artery.   Electronically Signed   By: Sebastian Ache   On: 10/18/2014 14:14   Mr Brain Wo Contrast  10/18/2014   CLINICAL DATA:  Worsening right-sided numbness.  EXAM: MRI HEAD WITHOUT CONTRAST  MRA HEAD WITHOUT CONTRAST  TECHNIQUE: Multiplanar, multiecho pulse sequences of the brain and surrounding structures were obtained without intravenous contrast. Angiographic images of the head were obtained using MRA technique without contrast.  COMPARISON:  Head CT 10/18/2014  FINDINGS: MRI HEAD  FINDINGS  The examination had to be discontinued prior to completion due to patient's inability to tolerate being in the scanner. Only sagittal T1, axial diffusion, and coronal diffusion weighted images were obtained.  There is no evidence of acute or subacute infarct. Ventricles and sulci are normal in size for age. No gross intracranial hemorrhage is identified. There is no midline shift.  MRA HEAD FINDINGS  The visualized distal left vertebral artery is patent and dominant without stenosis. The distal right vertebral artery is diffusely hypoplastic with its V4 segment not well evaluated. The left PICA origin is patent. AICA origins are patent. Left SCA origin is patent. Right SCA appears patent, however its origin is not well evaluated. There is mild, less than 50% narrowing of the distal basilar artery just proximal to the SCA origins. There are patent posterior communicating arteries, left larger than right. There is suggestion of an at least moderate focal distal left P1 segment stenosis, however this may be technical as the narrowing is not as pronounced on concurrent CTA. There is mild narrowing of the distal left P2 segment, and there is mild-to-moderate distal PCA branch vessel irregularity bilaterally.  Internal carotid arteries are patent from skullbase to carotid termini. There is mild narrowing of the right supraclinoid ICA. The right A1 segment is hypoplastic. There is a patent anterior communicating artery. No significant ACA stenosis is identified. MCAs are unremarkable aside from mild branch vessel irregularity bilaterally. No intracranial aneurysm is identified.  IMPRESSION: 1. Incomplete examination as above.  No acute infarct. 2. Mild right supraclinoid ICA stenosis, minimal narrowing of the distal basilar artery, and at least mild narrowing of the left P1 and P2 segments with anterior and posterior circulation branch vessel irregularity as above. Early atherosclerosis and vasculitis are  considerations. 3. Hypoplastic right vertebral artery. Patent and dominant distal left vertebral artery.   Electronically Signed   By: Sebastian Ache   On: 10/18/2014 14:14     EKG Interpretation   Date/Time:  Tuesday October 18 2014 11:25:12 EDT Ventricular Rate:  81 PR Interval:  173 QRS Duration: 92 QT Interval:  376 QTC Calculation: 436 R Axis:   47 Text Interpretation:  Sinus rhythm No significant change was found  Confirmed by Manus Gunning  MD, Sylvan Sookdeo ([id number]) on 10/18/2014 11:49:14 AM      MDM   Final diagnoses:  Paresthesias  Essential hypertension   intermittent right-sided numbness in the arm and leg since 3 days ago. Coming and going. No facial droop or visual change. No focal weakness. Symptoms lasting for several minutes to now hours at a time. No headache.  Code stroke not activated as last seen normal was 3  days ago.  CT head normal. Patient hypertensive and states she was told she no longer needed blood pressure medication.  Tele neurologist consult Dr. Loreta Ave who recommends evaluation for lacunar infarct. She states MRA and MRI should be obtained. If these are negative, patient can follow-up as an outpatient with neurology   Neurology further recommendations include echocardiogram, lipid panel  MRI is negative for stroke but limited. Patient's blood pressure remains uncontrolled. There is narrowing of the vasculature on the left side which may explain patient's right-sided symptoms  D/w Dr. Ardyth Harps who states no neuro coverage at [facility] this week. Discussed with Dr. Thad Ranger. She does not feel patient needs admission. No evidence of stroke on MRI. No evidence of carotid artery dissection. Patient with no neck pain. She recommended starting patient on aspirin as well as blood pressure control. States remainder of stroke workup, be performed as an outpatient. D/w Dr. Ardyth Harps who is agreeable to patient going home.  Will start ASA, lisinopril-HCTZ for BP. Follow up with PCP  and neurology. Return precautions discussed.  I personally performed the services described in this documentation, which was scribed in my presence. The recorded information has been reviewed and is accurate.   Glynn Octave, MD 10/18/14 [phone]

## 2014-10-18 NOTE — Discharge Instructions (Signed)
Paresthesia There is no evidence of stroke. Take the blood pressure medication and aspirin as prescribed. You need to follow up with the neurologist. Return to the ED if you develop new or worsening symptoms. Paresthesia is an abnormal burning or prickling sensation. This sensation is generally felt in the hands, arms, legs, or feet. However, it may occur in any part of the body. It is usually not painful. The feeling may be described as:  Tingling or numbness.  "Pins and needles."  Skin crawling.  Buzzing.  Limbs "falling asleep."  Itching. Most people experience temporary (transient) paresthesia at some time in their lives. CAUSES  Paresthesia may occur when you breathe too quickly (hyperventilation). It can also occur without any apparent cause. Commonly, paresthesia occurs when pressure is placed on a nerve. The feeling quickly goes away once the pressure is removed. For some people, however, paresthesia is a long-lasting (chronic) condition caused by an underlying disorder. The underlying disorder may be:  A traumatic, direct injury to nerves. Examples include a:  Broken (fractured) neck.  Fractured skull.  A disorder affecting the brain and spinal cord (central nervous system). Examples include:  Transverse myelitis.  Encephalitis.  Transient ischemic attack.  Multiple sclerosis.  Stroke.  Tumor or blood vessel problems, such as an arteriovenous malformation pressing against the brain or spinal cord.  A condition that damages the peripheral nerves (peripheral neuropathy). Peripheral nerves are not part of the brain and spinal cord. These conditions include:  Diabetes.  Peripheral vascular disease.  Nerve entrapment syndromes, such as carpal tunnel syndrome.  Shingles.  Hypothyroidism.  Vitamin B12 deficiencies.  Alcoholism.  Heavy metal poisoning (lead, arsenic).  Rheumatoid arthritis.  Systemic lupus erythematosus. DIAGNOSIS  Your caregiver will  attempt to find the underlying cause of your paresthesia. Your caregiver may:  Take your medical history.  Perform a physical exam.  Order various lab tests.  Order imaging tests. TREATMENT  Treatment for paresthesia depends on the underlying cause. HOME CARE INSTRUCTIONS  Avoid drinking alcohol.  You may consider massage or acupuncture to help relieve your symptoms.  Keep all follow-up appointments as directed by your caregiver. SEEK IMMEDIATE MEDICAL CARE IF:   You feel weak.  You have trouble walking or moving.  You have problems with speech or vision.  You feel confused.  You cannot control your bladder or bowel movements.  You feel numbness after an injury.  You faint.  Your burning or prickling feeling gets worse when walking.  You have pain, cramps, or dizziness.  You develop a rash. MAKE SURE YOU:  Understand these instructions.  Will watch your condition.  Will get help right away if you are not doing well or get worse. Document Released: 06/28/2002 Document Revised: 09/30/2011 Document Reviewed: 03/29/2011 [facility] Patient Information 2015 [place], [place]. This information is not intended to replace advice given to you by your health care provider. Make sure you discuss any questions you have with your health care provider.

## 2014-10-18 NOTE — ED Notes (Signed)
Called [facility] at 12:16

## 2014-10-18 NOTE — ED Notes (Signed)
Patient c/o intermittent "episodes" of numbness and weakness to right arm and leg. Denies any facial drooping or visual changes. Patient does report change in speech today and "slight headache."

## 2014-10-19 ENCOUNTER — Encounter (HOSPITAL_COMMUNITY): Payer: Self-pay | Admitting: Family Medicine

## 2014-10-19 ENCOUNTER — Emergency Department (HOSPITAL_COMMUNITY)
Admission: EM | Admit: 2014-10-19 | Discharge: 2014-10-19 | Disposition: A | Discharge status: Home / Self Care | Payer: Commercial Managed Care - PPO | Attending: Emergency Medicine | Admitting: Emergency Medicine

## 2014-10-19 DIAGNOSIS — R202 Paresthesia of skin: Secondary | ICD-10-CM | POA: Insufficient documentation

## 2014-10-19 DIAGNOSIS — Z79899 Other long term (current) drug therapy: Secondary | ICD-10-CM | POA: Insufficient documentation

## 2014-10-19 DIAGNOSIS — I10 Essential (primary) hypertension: Secondary | ICD-10-CM | POA: Insufficient documentation

## 2014-10-19 DIAGNOSIS — Z7982 Long term (current) use of aspirin: Secondary | ICD-10-CM | POA: Diagnosis not present

## 2014-10-19 DIAGNOSIS — R200 Anesthesia of skin: Secondary | ICD-10-CM | POA: Diagnosis present

## 2014-10-19 LAB — CBG MONITORING, ED: Glucose-Capillary: 94 mg/dL (ref 70–99)

## 2014-10-19 NOTE — ED Notes (Signed)
Pt here for multiple numbness episodes. [name]. sts is getting worse. sts last episode started about 4 pm yesterday. sts worse today the whole right side of body is numb.

## 2014-10-19 NOTE — Discharge Instructions (Signed)
Please follow up with your primary care physician in 1-2 days. If you do not have one please call the [facility] number listed above. Please follow up with a neurologist to schedule a follow up appointment.  Please read all discharge instructions and return precautions.   Paresthesia Paresthesia is an abnormal burning or prickling sensation. This sensation is generally felt in the hands, arms, legs, or feet. However, it may occur in any part of the body. It is usually not painful. The feeling may be described as:  Tingling or numbness.  "Pins and needles."  Skin crawling.  Buzzing.  Limbs "falling asleep."  Itching. Most people experience temporary (transient) paresthesia at some time in their lives. CAUSES  Paresthesia may occur when you breathe too quickly (hyperventilation). It can also occur without any apparent cause. Commonly, paresthesia occurs when pressure is placed on a nerve. The feeling quickly goes away once the pressure is removed. For some people, however, paresthesia is a long-lasting (chronic) condition caused by an underlying disorder. The underlying disorder may be:  A traumatic, direct injury to nerves. Examples include a:  Broken (fractured) neck.  Fractured skull.  A disorder affecting the brain and spinal cord (central nervous system). Examples include:  Transverse myelitis.  Encephalitis.  Transient ischemic attack.  Multiple sclerosis.  Stroke.  Tumor or blood vessel problems, such as an arteriovenous malformation pressing against the brain or spinal cord.  A condition that damages the peripheral nerves (peripheral neuropathy). Peripheral nerves are not part of the brain and spinal cord. These conditions include:  Diabetes.  Peripheral vascular disease.  Nerve entrapment syndromes, such as carpal tunnel syndrome.  Shingles.  Hypothyroidism.  Vitamin B12 deficiencies.  Alcoholism.  Heavy metal poisoning (lead,  arsenic).  Rheumatoid arthritis.  Systemic lupus erythematosus. DIAGNOSIS  Your caregiver will attempt to find the underlying cause of your paresthesia. Your caregiver may:  Take your medical history.  Perform a physical exam.  Order various lab tests.  Order imaging tests. TREATMENT  Treatment for paresthesia depends on the underlying cause. HOME CARE INSTRUCTIONS  Avoid drinking alcohol.  You may consider massage or acupuncture to help relieve your symptoms.  Keep all follow-up appointments as directed by your caregiver. SEEK IMMEDIATE MEDICAL CARE IF:   You feel weak.  You have trouble walking or moving.  You have problems with speech or vision.  You feel confused.  You cannot control your bladder or bowel movements.  You feel numbness after an injury.  You faint.  Your burning or prickling feeling gets worse when walking.  You have pain, cramps, or dizziness.  You develop a rash. MAKE SURE YOU:  Understand these instructions.  Will watch your condition.  Will get help right away if you are not doing well or get worse. Document Released: 06/28/2002 Document Revised: 09/30/2011 Document Reviewed: 03/29/2011 [facility] Patient Information 2015 [place], [place]. This information is not intended to replace advice given to you by your health care provider. Make sure you discuss any questions you have with your health care provider.

## 2014-10-19 NOTE — ED Provider Notes (Signed)
CSN: [id number]     Arrival date & time 10/19/14  1854 History   First MD Initiated Contact with Patient 10/19/14 1952     Chief Complaint  Patient presents with  . Numbness     (Consider location/radiation/quality/duration/timing/severity/associated sxs/prior Treatment) HPI Comments: Patient is a 34 yo F PMHx significant for HTN presenting to the ED for evaluation of her worsening right-sided numbness. Patient states [facility], since leaving yesterday she states that her numbness is becoming constant, describing it as a tingling sensation. She denies medical history but notes grandmother had history of CVA; she denies FHx of multiple sclerosis. She states she used to take HTN medication but is no longer prescribed any. She states she has history of migraines but denies any recently. Denies any fevers, chills, chest pain, shortness of breath, syncope.   Past Medical History  Diagnosis Date  . Hypertension    Past Surgical History  Procedure Laterality Date  . Cesarean section    . Tubal ligation     Family History  Problem Relation Age of Onset  . Stroke Maternal Grandmother   . Heart attack Brother   . Heart attack Maternal Grandmother    History  Substance Use Topics  . Smoking status: Never Smoker   . Smokeless tobacco: Never Used  . Alcohol Use: No   OB History    Gravida Para Term Preterm AB TAB SAB Ectopic Multiple Living   2 2 2       2      Review of Systems  Neurological: Positive for numbness.  All other systems reviewed and are negative.     Allergies  Review of patient's allergies indicates no known allergies.  Home Medications   Prior to Admission medications   Medication Sig Start Date End Date Taking? Authorizing Provider  aspirin 81 MG chewable tablet Chew 1 tablet (81 mg total) by mouth daily. 10/18/14  Yes Glynn OctaveStephen Rancour, MD  ibuprofen (ADVIL,MOTRIN) 200 MG tablet Take 400 mg by mouth every 6  (six) hours as needed for mild pain or moderate pain.   Yes Historical Provider, MD  lisinopril-hydrochlorothiazide (ZESTORETIC) 10-12.5 MG per tablet Take 1 tablet by mouth daily. 10/18/14  Yes Glynn OctaveStephen Rancour, MD  dextromethorphan-guaiFENesin [facility](MUCINEX DM) 30-600 MG per 12 hr tablet Take 1 tablet by mouth 2 (two) times daily. Patient not taking: Reported on 10/18/2014 07/18/14   Vanetta MuldersScott Zackowski, MD  naproxen (NAPROSYN) 500 MG tablet Take 1 tablet (500 mg total) by mouth 2 (two) times daily. Patient not taking: Reported on 10/18/2014 07/18/14   Vanetta MuldersScott Zackowski, MD   BP 153/73 mmHg  Pulse 92  Temp(Src) 98.5 F (36.9 C) (Oral)  Resp 22  Ht 5' (1.524 m)  Wt 245 lb (111.131 kg)  BMI 47.85 kg/m2  SpO2 99%  LMP 09/04/2014 Physical Exam  Constitutional: She is oriented to person, place, and time. She appears well-developed and well-nourished. No distress.  HENT:  Head: Normocephalic and atraumatic.  Right Ear: External ear normal.  Left Ear: External ear normal.  Nose: Nose normal.  Mouth/Throat: Oropharynx is clear and moist. No oropharyngeal exudate.  Eyes: Conjunctivae and EOM are normal. Pupils are equal, round, and reactive to light.  Neck: Normal range of motion. Neck supple.  No nuchal rigidity.   Cardiovascular: Normal rate, regular rhythm, normal heart sounds and intact distal pulses.   Pulmonary/Chest: Effort normal and breath sounds normal. No respiratory distress.  Abdominal: Soft. There is no  tenderness.  Neurological: She is alert and oriented to person, place, and time. She has normal strength. No cranial nerve deficit. Gait normal. GCS eye subscore is 4. GCS verbal subscore is 5. GCS motor subscore is 6.  Sensation grossly intact. Sharp and dull sensation intact bilaterally. No pronator drift.  Bilateral heel-knee-shin intact.  Skin: Skin is warm and dry. She is not diaphoretic.  Nursing note and vitals reviewed.   ED Course  Procedures (including critical care  time) Labs Review Labs Reviewed - No data to display  Imaging Review Ct Head Wo Contrast  10/18/2014   CLINICAL DATA:  34 year old hypertensive female presenting with intermittent right-sided numbness for 2 days getting worse. Some left-sided numbness. Initial encounter.  EXAM: CT HEAD WITHOUT CONTRAST  TECHNIQUE: Contiguous axial images were obtained from the base of the skull through the vertex without intravenous contrast.  COMPARISON:  07/17/2006  FINDINGS: No intracranial hemorrhage.  No CT evidence of large acute infarct. If infarct or demyelinating process were of high clinical concern, MR imaging would prove more sensitive for detection of these findings.  Left cerebellar tonsil minimally low lying but within range of normal limits and unchanged.  Partially empty sella stable.  Orbital structures unremarkable.  No hydrocephalus.  No intracranial mass lesion noted on this unenhanced exam.  Mastoid air cells, middle ear cavities and visualized paranasal sinuses are clear.  IMPRESSION: No intracranial hemorrhage.  No CT evidence of large acute infarct. If infarct or demyelinating process were of high clinical concern, MR imaging would prove more sensitive for detection of these findings.   Electronically Signed   By: Lacy Duverney M.D.   On: 10/18/2014 12:36   Ct Angio Neck W/cm &/or Wo/cm  10/18/2014   CLINICAL DATA:  Stroke workup.  Reportedly there is no neck pain.  EXAM: CT ANGIOGRAPHY NECK  TECHNIQUE: Multidetector CT imaging of the neck was performed using the standard protocol during bolus administration of intravenous contrast. Multiplanar CT image reconstructions and MIPs were obtained to evaluate the vascular anatomy. Carotid stenosis measurements (when applicable) are obtained utilizing NASCET criteria, using the distal internal carotid diameter as the denominator.  CONTRAST:  80mL OMNIPAQUE IOHEXOL 350 MG/ML SOLN  COMPARISON:  None.  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 Given absence of neck pain and low concern for dissection, patient young age and large dose, repeat imaging will not be performed at this time.  Aortic arch: Nondiagnostic  Right carotid system: Visible cervical portions are normal, with adequate visualization beginning at the level of the distal common carotid artery. No evidence of atherosclerosis or dissection. There ismoderate right supra clinoid ICA of uncertain cause. Intracranial vessels better visualized on dedicated MRA.  Left carotid system: Visible portions are normal, with adequate visualization beginning at the level of the distal common carotid artery. No evidence of atherosclerosis or dissection.  Vertebral arteries:Poor visualization of the V1 and V2 segments. The right vertebral artery is hypoplastic and mainly serves the PICA. The right V4 segment beyond the PICA is not clearly enhancing on this study, but is patent and continuous to the basilar based on contemporaneous MRA, and the CTA findings are likely technical.  IMPRESSION: 1. Nondiagnostic CTA from the arch to mandibular angle due to patient size and contrast timing. 2. The hypoplastic right V4 segment is poorly visualized, but patent  based on contemporaneous MRA. 3. Visible cervical carotids are widely patent. 4. Moderate right supraclinoid ICA narrowing. Reference intracranial MRA.   Electronically Signed   By: Marnee Spring M.D.   On: 10/18/2014 14:18   Mr Maxine Glenn Head Wo Contrast  10/18/2014   CLINICAL DATA:  Worsening right-sided numbness.  EXAM: MRI HEAD WITHOUT CONTRAST  MRA HEAD WITHOUT CONTRAST  TECHNIQUE: Multiplanar, multiecho pulse sequences of the brain and surrounding structures were obtained without intravenous contrast. Angiographic images of the head were obtained using MRA technique without  contrast.  COMPARISON:  Head CT 10/18/2014  FINDINGS: MRI HEAD FINDINGS  The examination had to be discontinued prior to completion due to patient's inability to tolerate being in the scanner. Only sagittal T1, axial diffusion, and coronal diffusion weighted images were obtained.  There is no evidence of acute or subacute infarct. Ventricles and sulci are normal in size for age. No gross intracranial hemorrhage is identified. There is no midline shift.  MRA HEAD FINDINGS  The visualized distal left vertebral artery is patent and dominant without stenosis. The distal right vertebral artery is diffusely hypoplastic with its V4 segment not well evaluated. The left PICA origin is patent. AICA origins are patent. Left SCA origin is patent. Right SCA appears patent, however its origin is not well evaluated. There is mild, less than 50% narrowing of the distal basilar artery just proximal to the SCA origins. There are patent posterior communicating arteries, left larger than right. There is suggestion of an at least moderate focal distal left P1 segment stenosis, however this may be technical as the narrowing is not as pronounced on concurrent CTA. There is mild narrowing of the distal left P2 segment, and there is mild-to-moderate distal PCA branch vessel irregularity bilaterally.  Internal carotid arteries are patent from skullbase to carotid termini. There is mild narrowing of the right supraclinoid ICA. The right A1 segment is hypoplastic. There is a patent anterior communicating artery. No significant ACA stenosis is identified. MCAs are unremarkable aside from mild branch vessel irregularity bilaterally. No intracranial aneurysm is identified.  IMPRESSION: 1. Incomplete examination as above.  No acute infarct. 2. Mild right supraclinoid ICA stenosis, minimal narrowing of the distal basilar artery, and at least mild narrowing of the left P1 and P2 segments with anterior and posterior circulation branch vessel  irregularity as above. Early atherosclerosis and vasculitis are considerations. 3. Hypoplastic right vertebral artery. Patent and dominant distal left vertebral artery.   Electronically Signed   By: Sebastian Ache   On: 10/18/2014 14:14   Mr Brain Wo Contrast  10/18/2014   CLINICAL DATA:  Worsening right-sided numbness.  EXAM: MRI HEAD WITHOUT CONTRAST  MRA HEAD WITHOUT CONTRAST  TECHNIQUE: Multiplanar, multiecho pulse sequences of the brain and surrounding structures were obtained without intravenous contrast. Angiographic images of the head were obtained using MRA technique without contrast.  COMPARISON:  Head CT 10/18/2014  FINDINGS: MRI HEAD FINDINGS  The examination had to be discontinued prior to completion due to patient's inability to tolerate being in the scanner. Only sagittal T1, axial diffusion, and coronal diffusion weighted images were obtained.  There is no evidence of acute or subacute infarct. Ventricles and sulci are normal in size for age. No gross intracranial hemorrhage is identified. There is no midline shift.  MRA HEAD FINDINGS  The visualized distal left vertebral artery is patent and dominant without stenosis. The distal right vertebral artery is diffusely hypoplastic with its V4 segment not well evaluated. The left PICA  origin is patent. AICA origins are patent. Left SCA origin is patent. Right SCA appears patent, however its origin is not well evaluated. There is mild, less than 50% narrowing of the distal basilar artery just proximal to the SCA origins. There are patent posterior communicating arteries, left larger than right. There is suggestion of an at least moderate focal distal left P1 segment stenosis, however this may be technical as the narrowing is not as pronounced on concurrent CTA. There is mild narrowing of the distal left P2 segment, and there is mild-to-moderate distal PCA branch vessel irregularity bilaterally.  Internal carotid arteries are patent from skullbase to  carotid termini. There is mild narrowing of the right supraclinoid ICA. The right A1 segment is hypoplastic. There is a patent anterior communicating artery. No significant ACA stenosis is identified. MCAs are unremarkable aside from mild branch vessel irregularity bilaterally. No intracranial aneurysm is identified.  IMPRESSION: 1. Incomplete examination as above.  No acute infarct. 2. Mild right supraclinoid ICA stenosis, minimal narrowing of the distal basilar artery, and at least mild narrowing of the left P1 and P2 segments with anterior and posterior circulation branch vessel irregularity as above. Early atherosclerosis and vasculitis are considerations. 3. Hypoplastic right vertebral artery. Patent and dominant distal left vertebral artery.   Electronically Signed   By: Sebastian Ache   On: 10/18/2014 14:14     EKG Interpretation None      Discussed patient case with Dr. Leroy Kennedy who does not recommend further work up or repeat imaging, states patient is appropriate for outpatient follow up.   MDM   Final diagnoses:  Paresthesia of right arm    Filed Vitals:   10/19/14 2124  BP: 153/73  Pulse: 92  Temp: 98.5 F (36.9 C)  Resp: 22   Afebrile, NAD, non-toxic appearing, AAOx4.  No neurofocal deficits on examination. Sharp and dull sensation intact bilaterally. No fever to suggest infectious etiology. No nuchal rigidity or toxicities or fever to suggest meningitis. No headache to suggest intracranial process such as but not limited to [facility], ICH, hypertensive emergency.  Reviewed images and results from visit yesterday. Discussed patient case with Dr. Leroy Kennedy who does not recommend further testing at this time, again feels patient is appropriate for outpatient follow up. Advised patient to follow up with neurology as an outpatient. Return precautions discussed. Patient is agreeable to plan. Patient is stable at time of discharge   Francee Piccolo, PA-C 10/20/14 0030  Doug Sou,  MD 10/20/14 [phone]

## 2014-10-31 ENCOUNTER — Encounter: Payer: Self-pay | Admitting: Neurology

## 2014-10-31 ENCOUNTER — Ambulatory Visit (INDEPENDENT_AMBULATORY_CARE_PROVIDER_SITE_OTHER): Payer: Commercial Managed Care - PPO | Admitting: Neurology

## 2014-10-31 VITALS — BP 135/88 | HR 97 | Temp 97.8°F | Ht 60.0 in | Wt 263.0 lb

## 2014-10-31 DIAGNOSIS — R29818 Other symptoms and signs involving the nervous system: Secondary | ICD-10-CM | POA: Diagnosis not present

## 2014-10-31 DIAGNOSIS — G43119 Migraine with aura, intractable, without status migrainosus: Secondary | ICD-10-CM

## 2014-10-31 DIAGNOSIS — I639 Cerebral infarction, unspecified: Secondary | ICD-10-CM

## 2014-10-31 DIAGNOSIS — G8190 Hemiplegia, unspecified affecting unspecified side: Secondary | ICD-10-CM

## 2014-10-31 DIAGNOSIS — R569 Unspecified convulsions: Secondary | ICD-10-CM | POA: Diagnosis not present

## 2014-10-31 DIAGNOSIS — G43919 Migraine, unspecified, intractable, without status migrainosus: Secondary | ICD-10-CM

## 2014-10-31 MED ORDER — ALPRAZOLAM 0.5 MG PO TABS: ORAL_TABLET | ORAL | Status: DC

## 2014-10-31 MED ORDER — TOPIRAMATE 25 MG PO TABS: 50.0000 mg | ORAL_TABLET | Freq: Two times a day (BID) | ORAL | Status: DC

## 2014-10-31 NOTE — Patient Instructions (Addendum)
Overall you are doing fairly well but I do want to suggest a few things today:   Remember to drink plenty of fluid, eat healthy meals and do not skip any meals. Try to eat protein with a every meal and eat a healthy snack such as fruit or nuts in between meals. Try to keep a regular sleep-wake schedule and try to exercise daily, particularly in the form of walking, 20-30 minutes a day, if you can.   As far as your medications are concerned, I would like to suggest: Topamax. Start with 25mg  twice daily. Increase to 50mg  twice daly in one week. Xanax before MRi as directed  As far as diagnostic testing: MRI of the brain. Fasting labs at your convenience.   I would like to see you back in 3 months, sooner if we need to. Please call us with any interim questions, concerns, problems, updates or refill requests.   Please also call us for any test results so we can go over those with you on the phone.  My clinical assistant and will answer any of your questions and relay your messages to me and also relay most of my messages to you.   Our phone number is [phone]. We also have an after hours call service for urgent matters and there is a physician on-call for urgent questions. For any emergencies you know to call 911 or go to the nearest emergency room

## 2014-10-31 NOTE — Progress Notes (Signed)
[facility]    Provider:  Dr Lucia Gaskins Referring Provider: Avis Epley, PA* Primary Care Physician:  Nicole Travis  CC:  migraine  HPI:  Nicole Travis is a 34 y.o. female here as a referral from Dr. Jean Travis for   PMHx migraines.  Having episodic right-sided numbness including the face, arm and leg. Started Easter Sunday with stuttering (27th) then went to the ED on the 29th and MRI did not show acute stroke. No headache. On the 29th the symptoms became persistent and have remained persistent. Now she has muscle contractions/cramps. She can't hod hold a pen, can't do anything due to tightness. Not painful, just feels like the side is asleep. Feels like needles is sticking her. The last 2 morning the right side wouldn't move, weakness as well. Getting worse, progressive. Nothing makes it better. The muscle relaxers don't help. It just "knocks her out". She feels like popeye like she laying cockeyed. She is getting very anxious over the symptoms.   Reviewed notes, labs and imaging from outside physicians, which showed: fIncomplete MRI of the brain but images (personally reviewed images) showed nothing acute Urine drug screen negative. CMP unremarkable.    Review of Systems: Patient complains of symptoms per HPI as well as the following symptoms: fatigue, SOB, trouble swallowing, feeling hot/cold, increased thirst, flushing, numbness, weakness, slurred speech, difficulty, swallowing, dizziness. Pertinent negatives per HPI. All others negative.   History   Social History  . Marital Status: Divorced    Spouse Name: N/A  . Number of Children: 2  . Years of Education: 12   Occupational History  . Ko file    Social History Main Topics  . Smoking status: Never Smoker   . Smokeless tobacco: Never Used  . Alcohol Use: No  . Drug Use: No  . Sexual Activity: Yes    Birth Control/ Protection: None   Other Topics Concern  . Not on file   Social  History Narrative   Lives at home with boyfriend and 4 kids.   Right handed.   Caffeine use:  Drinks 20 ounces per week Nicole Travis)        Family History  Problem Relation Age of Onset  . Stroke Maternal Grandmother   . Heart attack Brother   . Heart attack Maternal Grandmother   . Cervical cancer Mother   . Breast cancer Sister   . Breast cancer Sister   . Breast cancer Sister     Past Medical History  Diagnosis Date  . Hypertension     Past Surgical History  Procedure Laterality Date  . Cesarean section    . Tubal ligation      Current Outpatient Prescriptions  Medication Sig Dispense Refill  . aspirin 81 MG chewable tablet Chew 1 tablet (81 mg total) by mouth daily. 30 tablet 0  . clindamycin (CLEOCIN) 300 MG capsule Take 300 mg by mouth every 6 (six) hours. For 7 days    . cloNIDine (CATAPRES) 0.1 MG tablet Take 0.1 mg by mouth 2 (two) times daily. For 10 days    . cyclobenzaprine (FLEXERIL) 10 MG tablet Take 10 mg by mouth 3 (three) times daily. For 10 days    . ibuprofen (ADVIL,MOTRIN) 200 MG tablet Take 400 mg by mouth every 6 (six) hours as needed for mild pain or moderate pain.    Marland Kitchen lisinopril-hydrochlorothiazide (ZESTORETIC) 10-12.5 MG per tablet Take 1 tablet by mouth daily. 30 tablet 0  . dextromethorphan-guaiFENesin (MUCINEX DM) 30-600 MG  per 12 hr tablet Take 1 tablet by mouth 2 (two) times daily. (Patient not taking: Reported on 10/18/2014) 14 tablet 1  . naproxen (NAPROSYN) 500 MG tablet Take 1 tablet (500 mg total) by mouth 2 (two) times daily. (Patient not taking: Reported on 10/18/2014) 14 tablet 0   No current facility-administered medications for this visit.    Allergies as of 10/31/2014  . (No Known Allergies)    Vitals: BP 135/88 mmHg  Pulse 97  Temp(Src) 97.8 F (36.6 C)  Ht 5' (1.524 m)  Wt 263 lb (119.296 kg)  BMI 51.36 kg/m2  LMP 09/04/2014 Last Weight:  Wt Readings from Last 1 Encounters:  10/31/14 263 lb (119.296 kg)   Last Height:     Ht Readings from Last 1 Encounters:  10/31/14 5' (1.524 m)    Physical exam: Exam: Gen: NAD, conversant, well nourised, obese, well groomed                     CV: RRR, no MRG. No Carotid Bruits. No peripheral edema, warm, nontender Eyes: Conjunctivae clear without exudates or hemorrhage  Neuro: Detailed Neurologic Exam  Speech:    Speech is normal; fluent and spontaneous with normal comprehension.  Cognition:    The patient is oriented to person, place, and time;     recent and remote memory intact;     language fluent;     normal attention, concentration,     fund of knowledge Cranial Nerves:    The pupils are equal, round, and reactive to light. The fundi are normal and spontaneous venous pulsations are present. Visual fields are full to finger confrontation. Extraocular movements are intact. Trigeminal sensation is intact and the muscles of mastication are normal. The face is symmetric. The palate elevates in the midline. Hearing intact. Voice is normal. Shoulder shrug is normal. The tongue has normal motion without fasciculations.   Coordination:    Normal finger to nose and heel to shin.   Gait:    Antalgic gait  Motor Observation:    Mild postural tremor Tone:    Normal muscle tone.    Posture:    Posture is normal.     Strength: Right pronator drift. Right proximal weakness. Otherwise strength is V/V in the upper and lower limbs.      Sensation: allodynia and hyperesthesia on the right     Reflex Exam:  DTR's:    Deep tendon reflexes in the upper and lower extremities are symmetric bilaterally.   Toes:    The toes are downgoing bilaterally.   Clonus:    Clonus is absent.    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 Seizure vs complicated migraine vs sensory seizure. Needs an MRI of the brain as the first one was incomplete. EEG. Will start Topamax.   Nicole DeanAntonia Rinnah Peppel, MD  [facility] [address] [place] [id number]  Phone [phone] Fax [phone]

## 2014-11-01 ENCOUNTER — Ambulatory Visit (INDEPENDENT_AMBULATORY_CARE_PROVIDER_SITE_OTHER): Payer: Commercial Managed Care - PPO | Admitting: Neurology

## 2014-11-01 DIAGNOSIS — R569 Unspecified convulsions: Secondary | ICD-10-CM

## 2014-11-01 NOTE — Procedures (Signed)
     History: Nicole Travis is a 34 year old patient with a history of episodes of right-sided numbness. This began on Easter Sunday of 2016. The patient is being evaluated for possible migraines versus sensory type seizure events.  This is a routine EEG. No skull defects are noted. Medications include Xanax, aspirin, clonidine, Flexeril, Advil, Zestoretic, Naprosyn, and Topamax.  EEG classification: Dysrhythmia grade 1 generalized, left greater than right hemisphere  Description of the recording: The background rhythms of this recording consists of a well modulated medium amplitude alpha rhythm of 9 Hz that is reactive to eye opening and closure. As the record progresses, photic stimulation is performed, and this results in a bilateral and symmetric photic driving response. Hyperventilation was also performed, resulting in a minimal buildup of the background rhythm activities without significant slowing seen. Intermittently during the recording there appears to be a 6 Hz theta slowing that is generalized in nature, but clearly asymmetric, more prominent in the left temporal region. This is seen on multiple occasions during the recording. At no time does there appear to be evidence of spike or spike-wave discharges. EKG monitor shows no evidence of cardiac rhythm abnormalities with a heart rate of 84.  Impression: This is an abnormal EEG recording secondary to episodic theta slowing that is asymmetric, with a predominance in the left temporal area. This study would suggest a left brain abnormality, significance is unclear, no definite epileptiform discharges are seen.

## 2014-11-02 ENCOUNTER — Telehealth: Payer: Self-pay | Admitting: Neurology

## 2014-11-02 ENCOUNTER — Other Ambulatory Visit: Payer: Self-pay | Admitting: Neurology

## 2014-11-02 DIAGNOSIS — F411 Generalized anxiety disorder: Secondary | ICD-10-CM

## 2014-11-02 MED ORDER — CITALOPRAM HYDROBROMIDE 20 MG PO TABS: 20.0000 mg | ORAL_TABLET | Freq: Every day | ORAL | Status: DC

## 2014-11-02 NOTE — Telephone Encounter (Signed)
Patient had right-sided sensory disturbance. Discussed results of EEG which showed left-sided dysrhythmia. No epileptiform activity seen so unclear what this means, but may designate a left brain etiology. Patient was started on Topamax for headaches and possible sensory seizures during appointment. She is also experiencing significant anxiety that makes symptoms worse, discussed starting celexa and side effects including risk of suicidal thoughts (stop immediately and proceed to ED), hyponatremia and confusion, qt prolongation and cardiac side effects and to stop for any concerning symptoms. Patient understands. She is due for MRI w/wo contrast w seizure protocol.

## 2014-11-02 NOTE — Telephone Encounter (Signed)
Pt is calling requesting EEG results.  Please call and advise.

## 2014-11-03 ENCOUNTER — Telehealth: Payer: Self-pay | Admitting: Neurology

## 2014-11-03 ENCOUNTER — Other Ambulatory Visit: Payer: Self-pay | Admitting: Neurology

## 2014-11-03 MED ORDER — SERTRALINE HCL 50 MG PO TABS: 50.0000 mg | ORAL_TABLET | Freq: Every day | ORAL | Status: DC

## 2014-11-03 NOTE — Telephone Encounter (Signed)
Pt is calling stating she had a bad side effect from citalopram (CELEXA) 20 MG tablet, she states that it made her panic attacks worse and she thinks it may be too strong or she just can't take it.  Please call and advise.

## 2014-11-03 NOTE — Telephone Encounter (Signed)
Will try ZOloft thanks. Please let patient know

## 2014-11-03 NOTE — Telephone Encounter (Signed)
Called and spoke with patient to tell her Dr. Lucia GaskinsAhern would like her to try Zoloft. I told her she would put the order in and we will send that over to her pharmacy either today or tomorrow. Pt verbalized understanding.

## 2014-11-05 DIAGNOSIS — R29818 Other symptoms and signs involving the nervous system: Secondary | ICD-10-CM | POA: Insufficient documentation

## 2014-11-05 DIAGNOSIS — G8190 Hemiplegia, unspecified affecting unspecified side: Secondary | ICD-10-CM | POA: Insufficient documentation

## 2014-11-14 DIAGNOSIS — R29818 Other symptoms and signs involving the nervous system: Secondary | ICD-10-CM | POA: Diagnosis not present

## 2014-11-16 ENCOUNTER — Telehealth: Payer: Self-pay | Admitting: Neurology

## 2014-11-16 NOTE — Telephone Encounter (Signed)
Trying to return pt call from earlier. Gave [facility] phone number and office hours.

## 2014-11-16 NOTE — Telephone Encounter (Signed)
Patient called wanting to follow up on her MRI results. She had her MRI on Monday 11/14/14. C/b [phone]

## 2014-11-17 NOTE — Telephone Encounter (Signed)
Called patient and she stated she had MRI completed at [facility] on 11/14/14.

## 2014-11-18 ENCOUNTER — Other Ambulatory Visit: Payer: Self-pay | Admitting: Diagnostic Neuroimaging

## 2014-11-18 ENCOUNTER — Telehealth: Payer: Self-pay | Admitting: Neurology

## 2014-11-18 DIAGNOSIS — I639 Cerebral infarction, unspecified: Secondary | ICD-10-CM

## 2014-11-18 NOTE — Telephone Encounter (Signed)
Nicole Travis - Can you try to find this MRI reading? Look in the box in my office, maybe it is in there. In any case, we should let her know we are waiting for the final Neuroradiology read. Thank you

## 2014-11-18 NOTE — Telephone Encounter (Signed)
Error

## 2014-11-18 NOTE — Telephone Encounter (Signed)
I did not find a box in your office Dr. Lucia GaskinsAhern. I tried calling patient, but had to leave a voicemail.

## 2014-11-18 NOTE — Telephone Encounter (Signed)
Left message for patient to call back. Gave [facility] phone number and office hours.  

## 2014-11-18 NOTE — Telephone Encounter (Signed)
Patient returned call. Please call and advise.  °

## 2014-11-18 NOTE — Telephone Encounter (Signed)
Talked with patient to let her know we were still waiting on results from [facility] and we will call her once they are ready. Pt verbalized understanding.

## 2014-11-21 NOTE — Telephone Encounter (Signed)
Talked with pt about MRI results of her brain and told her there was nothing concerning per Dr. Lucia GaskinsAhern. I offered for her to come into the office for her to go over the results in detail with Dr. Lucia GaskinsAhern but she declined. I told her to call back if she had further questions. Pt verbalized understanding.

## 2015-02-06 ENCOUNTER — Encounter: Payer: Self-pay | Admitting: Neurology

## 2015-02-06 ENCOUNTER — Ambulatory Visit (INDEPENDENT_AMBULATORY_CARE_PROVIDER_SITE_OTHER): Payer: Commercial Managed Care - PPO | Admitting: Neurology

## 2015-02-06 VITALS — BP 122/82 | HR 72 | Ht 60.0 in | Wt 256.6 lb

## 2015-02-06 DIAGNOSIS — R200 Anesthesia of skin: Secondary | ICD-10-CM

## 2015-02-06 DIAGNOSIS — G609 Hereditary and idiopathic neuropathy, unspecified: Secondary | ICD-10-CM

## 2015-02-06 DIAGNOSIS — G8190 Hemiplegia, unspecified affecting unspecified side: Secondary | ICD-10-CM | POA: Diagnosis not present

## 2015-02-06 NOTE — Progress Notes (Signed)
[facility]    Provider:  Dr Lucia Gaskins Referring Provider: Avis Epley, PA* Primary Care Physician:  Pershing Proud  CC:  Right-sided numbness  HPI:  Nicole Travis is a 34 y.o. female here as a follow up  She is still having right sided numbness. It starts in the jaw bone, jaw line and includes the right arm and right leg. And also includes the right half of the entire thorax, abdomen and pelvis. Literally right in half all the way down the body. The face has improved a little bit but the rest of her body on the right is still completely numbn. Weakness as well. No neck pain. She gets neck tightness.    MRI brain 11/18/2014 IMPRESSION:  Equivocal MRI brain (without) demonstrating: 1. Minimal periventricular and ~4-5 punctate subcortical foci of non-specific gliosis. 2. 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 3. No acute findings.  PMHx migraines.  Having episodic right-sided numbness including the face, arm and leg. Started Easter Sunday with stuttering (27th) then went to the ED on the 29th and MRI did not show acute stroke. No headache. On the 29th the symptoms became persistent and have remained persistent. Now she has muscle contractions/cramps. She can't hod hold a pen, can't do anything due to tightness. Not painful, just feels like the side is asleep. Feels like needles is sticking her. The last 2 morning the right side wouldn't move, weakness as well. Getting worse, progressive. Nothing makes it better. The muscle relaxers don't help. It just "knocks her out". She feels like popeye like she laying cockeyed. She is getting very anxious over the symptoms.   Reviewed notes, labs and imaging from outside physicians, which showed: fIncomplete MRI of the brain but images (personally reviewed images) showed nothing acute Urine drug screen negative. CMP unremarkable.   Review of  Systems: Patient complains of symptoms per HPI as well as the following symptoms: cold intolerance, heat intolerance, swollen abdomen, abdominal pain, constipation, daytime sleepiness, freuency of urination, aching muscles, memory loss, numbness, speech difficulty, agotation, confusion. Pertinent negatives per HPI. All others negative.   History   Social History  . Marital Status: Divorced    Spouse Name: N/A  . Number of Children: 2  . Years of Education: 12   Occupational History  . Ko file    Social History Main Topics  . Smoking status: Never Smoker   . Smokeless tobacco: Never Used  . Alcohol Use: No  . Drug Use: No  . Sexual Activity: Yes    Birth Control/ Protection: None   Other Topics Concern  . Not on file   Social History Narrative   Lives at home with boyfriend and 4 kids.   Right handed.   Caffeine use:  Drinks 20 ounces per week [name])        Family History  Problem Relation Age of Onset  . Stroke Maternal Grandmother   . Heart attack Brother   . Heart attack Maternal Grandmother   . Cervical cancer Mother   . Breast cancer Sister   . Breast cancer Sister   . Breast cancer Sister     Past Medical History  Diagnosis Date  . Hypertension     Past Surgical History  Procedure Laterality Date  . Cesarean section    . Tubal ligation      Current Outpatient Prescriptions  Medication Sig Dispense Refill  . aspirin 81 MG chewable tablet Chew 1 tablet (  81 mg total) by mouth daily. 30 tablet 0  . lisinopril-hydrochlorothiazide (PRINZIDE,ZESTORETIC) 20-25 MG per tablet TK 1 T PO QD  1  . sertraline (ZOLOFT) 50 MG tablet Take 1 tablet (50 mg total) by mouth daily. 30 tablet 6  . topiramate (TOPAMAX) 25 MG tablet Take 2 tablets (50 mg total) by mouth 2 (two) times daily. Start with 25mg  twice daily and increase to 50mg  twice daily in one week 60 tablet 6   No current facility-administered medications for this visit.    Allergies as of 02/06/2015 -  Review Complete 02/06/2015  Allergen Reaction Noted  . Celexa [citalopram hydrobromide] Anxiety 02/06/2015    Vitals: BP 122/82 mmHg  Pulse 72  Ht 5' (1.524 m)  Wt 256 lb 9.6 oz (116.393 kg)  BMI 50.11 kg/m2 Last Weight:  Wt Readings from Last 1 Encounters:  02/06/15 256 lb 9.6 oz (116.393 kg)   Last Height:   Ht Readings from Last 1 Encounters:  02/06/15 5' (1.524 m)   Physical exam: Exam: Gen: NAD, conversant, well nourised, obese, well groomed  CV: RRR, no MRG. No Carotid Bruits. No peripheral edema, warm, nontender Eyes: Conjunctivae clear without exudates or hemorrhage  Neuro: Detailed Neurologic Exam  Speech:  Speech is normal; fluent and spontaneous with normal comprehension.  Cognition:  The patient is oriented to person, place, and time;  Cranial Nerves:  The pupils are equal, round, and reactive to light. Visual fields are full to finger confrontation. Extraocular movements are intact. Trigeminal sensation is intact and the muscles of mastication are normal. The face is symmetric. The palate elevates in the midline. Hearing intact. Voice is normal. Shoulder shrug is normal. The tongue has normal motion without fasciculations.    Gait:  Antalgic gait  Motor Observation:  Mild postural tremor Tone:  Normal muscle tone.   Posture:  Posture is normal.    Strength: Right pronator drift. Right proximal weakness. Otherwise strength is V/V in the upper and lower limbs.    Sensation: allodynia and hyperesthesia on the right   Reflex Exam:  DTR's:  Deep tendon reflexes in the upper and lower extremities are symmetric bilaterally.  Toes:  The toes are downgoing bilaterally.       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 Will order MRI cervical spine. Needs daily asa 81mg  for stroke prevention.  Naomie DeanAntonia Trell Secrist, MD  [facility] [address] [place], [place] [address]  Phone [phone] Fax [phone]  A total of 15 minutes was spent face-to-face with this patient. Over half this time was spent on counseling patient on the hemi-sensory loss diagnosis and different diagnostic and therapeutic options available.

## 2015-02-06 NOTE — Patient Instructions (Signed)
Remember to drink plenty of fluid, eat healthy meals and do not skip any meals. Try to eat protein with a every meal and eat a healthy snack such as fruit or nuts in between meals. Try to keep a regular sleep-wake schedule and try to exercise daily, particularly in the form of walking, 20-30 minutes a day, if you can.   As far as diagnostic testing: MRI cervical spine, labs  I would like to see you back in 6 months, sooner if we need to. Please call us with any interim questions, concerns, problems, updates or refill requests.   Please also call us for any test results so we can go over those with you on the phone.  My clinical assistant and will answer any of your questions and relay your messages to me and also relay most of my messages to you.   Our phone number is [phone]. We also have an after hours call service for urgent matters and there is a physician on-call for urgent questions. For any emergencies you know to call 911 or go to the nearest emergency room

## 2015-02-07 ENCOUNTER — Telehealth: Payer: Self-pay | Admitting: Neurology

## 2015-02-07 NOTE — Telephone Encounter (Signed)
Called patient to discuss labs especially low B12. Left message. Will call again tomorrow.

## 2015-02-08 ENCOUNTER — Other Ambulatory Visit: Payer: Self-pay | Admitting: Neurology

## 2015-02-08 DIAGNOSIS — D510 Vitamin B12 deficiency anemia due to intrinsic factor deficiency: Secondary | ICD-10-CM

## 2015-02-08 DIAGNOSIS — E538 Deficiency of other specified B group vitamins: Secondary | ICD-10-CM

## 2015-02-08 NOTE — Telephone Encounter (Signed)
Spoke to patient. She will take daily b12 supplementation and then retest in a month. Also, if mri of the c-spine is unrevealing can send to Dr. Roda ShuttersXu for seocnd opinion on right-sided numbness and weakness with negative MRI.

## 2015-02-08 NOTE — Telephone Encounter (Signed)
Patient is returning a call regarding her lab results.

## 2015-02-09 LAB — B12 AND FOLATE PANEL
Folate: 2.3 ng/mL — ABNORMAL LOW (ref >3.0)
Vitamin B-12: 112 pg/mL — ABNORMAL LOW (ref 211–946)

## 2015-02-09 LAB — RHEUMATOID FACTOR: Rhuematoid fact SerPl-aCnc: 9.6 IU/mL (ref 0.0–13.9)

## 2015-02-09 LAB — HIV ANTIBODY (ROUTINE TESTING W REFLEX): HIV SCREEN 4TH GENERATION: NONREACTIVE

## 2015-02-09 LAB — BASIC METABOLIC PANEL
BUN/Creatinine Ratio: 17 (ref 8–20)
BUN: 16 mg/dL (ref 6–20)
CALCIUM: 9.1 mg/dL (ref 8.7–10.2)
CO2: 23 mmol/L (ref 18–29)
CREATININE: 0.92 mg/dL (ref 0.57–1.00)
Chloride: 102 mmol/L (ref 97–108)
GFR calc non Af Amer: 80 mL/min/{1.73_m2} (ref >59)
GFR, EST AFRICAN AMERICAN: 93 mL/min/{1.73_m2} (ref >59)
Glucose: 86 mg/dL (ref 65–99)
Potassium: 3.6 mmol/L (ref 3.5–5.2)
Sodium: 142 mmol/L (ref 134–144)

## 2015-02-09 LAB — HEAVY METALS, BLOOD
ARSENIC: 5 ug/L (ref 2–23)
LEAD, BLOOD: NOT DETECTED ug/dL (ref 0–19)
Mercury: NOT DETECTED ug/L (ref 0.0–14.9)

## 2015-02-09 LAB — RPR: RPR Ser Ql: NONREACTIVE

## 2015-02-09 LAB — VITAMIN B6: Vitamin B6: 5.1 ug/L (ref 2.0–32.8)

## 2015-02-09 LAB — METHYLMALONIC ACID, SERUM: Methylmalonic Acid: 259 nmol/L (ref 0–378)

## 2015-02-09 LAB — HEPATITIS C ANTIBODY: Hep C Virus Ab: <0.1 s/co ratio (ref 0.0–0.9)

## 2015-02-09 LAB — ANTINUCLEAR ANTIBODIES, IFA: ANA Titer 1: NEGATIVE

## 2015-02-09 LAB — B. BURGDORFI ANTIBODIES: Lyme IgG/IgM Ab: <0.91 {ISR} (ref 0.00–0.90)

## 2015-02-09 LAB — SEDIMENTATION RATE: SED RATE: 19 mm/h (ref 0–32)

## 2015-02-09 LAB — HEMOGLOBIN A1C
Est. average glucose Bld gHb Est-mCnc: 114 mg/dL
Hgb A1c MFr Bld: 5.6 % (ref 4.8–5.6)

## 2015-02-09 LAB — VITAMIN B1: Thiamine: 139.1 nmol/L (ref 66.5–200.0)

## 2015-02-09 LAB — TSH: TSH: 2.61 u[IU]/mL (ref 0.450–4.500)

## 2015-02-09 LAB — ANA W/REFLEX: Anti Nuclear Antibody(ANA): NEGATIVE

## 2015-02-14 NOTE — Telephone Encounter (Signed)
Spoke w/ pt to let her know her folate level was low as well and Dr. Lucia Gaskins would like her to take multivitamin w/ Folate in it. Pt verbalized understanding. She stated she just started taking Women's multivitamin and is taking vitamin b12 with it.

## 2015-02-22 ENCOUNTER — Ambulatory Visit (INDEPENDENT_AMBULATORY_CARE_PROVIDER_SITE_OTHER): Payer: Commercial Managed Care - PPO

## 2015-02-22 DIAGNOSIS — G8190 Hemiplegia, unspecified affecting unspecified side: Secondary | ICD-10-CM | POA: Diagnosis not present

## 2015-02-22 DIAGNOSIS — R200 Anesthesia of skin: Secondary | ICD-10-CM

## 2015-02-23 MED ORDER — GADOPENTETATE DIMEGLUMINE 469.01 MG/ML IV SOLN: 20.0000 mL | Freq: Once | INTRAVENOUS | Status: AC | PRN

## 2015-02-27 ENCOUNTER — Telehealth: Payer: Self-pay | Admitting: *Deleted

## 2015-02-27 NOTE — Telephone Encounter (Signed)
Dr. Roda Shutters - will you give me a second opinion on this patient please? She has right-sided weakness and sensory loss but extensive workup including MRI of the brain and cervical cord are negative. Would you give me your opinion if maybe she had a stroke that we cannot visualize on MRI?   Kara Mead, If Dr. Roda Shutters agrees to give me a second opinion, please schedule her with him. Thanks.

## 2015-02-27 NOTE — Telephone Encounter (Signed)
Discussed with Dr. Lucia Gaskins and Dr. Danae Orleans.   Marvel Plan, MD PhD Stroke Neurology 02/27/2015 6:58 PM

## 2015-02-27 NOTE — Telephone Encounter (Signed)
Spoke w/ pt about normal MRI cervical spine. Pt verbalized understanding. She wants to know if Dr. Lucia Gaskins wants to send her to a stroke doctor to help rule out other causes of her symptoms. I told her I will speak w/ Dr. Lucia Gaskins and call her back.

## 2015-02-27 NOTE — Telephone Encounter (Signed)
-----   Message from Anson Fret, MD sent at 02/26/2015  2:23 PM EDT ----- Let patient know the MRI of her cervical spine is normal. thanks

## 2015-03-01 ENCOUNTER — Telehealth: Payer: Self-pay | Admitting: Neurology

## 2015-03-01 NOTE — Telephone Encounter (Signed)
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 Reviewed with Dr. Marjory Lies and we both feel this is a blood vessel. It does not appear to extend to the internal capsule but does sit right above it. Unsure if this could be the cause of patient's right-sided sensory symptoms and weakness. Suggested a repeat MRi of the brain w/wo contrast.

## 2015-03-07 ENCOUNTER — Telehealth: Payer: Self-pay | Admitting: Neurology

## 2015-03-07 NOTE — Telephone Encounter (Signed)
Pt is calling for refill on topiramate (TOPAMAX) 25 MG tablet and she spoke with Dr Lucia Gaskins last week and Dr Lucia Gaskins told her to look up Thalamic pain syndrome symptoms and to call back if she was experiencing those symptoms.

## 2015-03-07 NOTE — Telephone Encounter (Signed)
Spoke w/ pt to let her know Dr. Lucia Gaskins will not be in the office until Thursday. 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 Pt verbalized understanding.

## 2015-03-08 MED ORDER — TOPIRAMATE 50 MG PO TABS: 50.0000 mg | ORAL_TABLET | Freq: Two times a day (BID) | ORAL | Status: DC

## 2015-03-08 NOTE — Telephone Encounter (Signed)
Shanda Bumps - would you mind refilling for me? thanks

## 2015-03-08 NOTE — Telephone Encounter (Signed)
I called back.  Spoke with patient.  She asked that her pharmacy be changed to [facility] in [place], instead of [facility].  I have updated this info.  As well, since she is currently on  twice daily (two  tabs twice daily), asked for the new Rx to be sent for one  tab twice daily.  Total dose remains the same, but she will have less pills to take on a daily basis.  Rx has been sent.  Receipt confirmed by pharmacy.

## 2015-03-16 NOTE — Telephone Encounter (Signed)
Left message for patient. I do recommend a follow up MRI of the brain w/wo contrast. She can call back to schedule appointment to discuss or let me know when she wants to do it. There is no hurry unless her symptoms worsen. Would like to follow the thalamic lesion and ensure it is stable. Asked her to call back and let us know what she would like to do thanks.

## 2015-06-06 ENCOUNTER — Telehealth: Payer: Self-pay | Admitting: Neurology

## 2015-06-06 MED ORDER — SERTRALINE HCL 100 MG PO TABS: 100.0000 mg | ORAL_TABLET | Freq: Every day | ORAL | Status: DC

## 2015-06-06 NOTE — Telephone Encounter (Signed)
I called back and spoke with the patient.  Says she would like a refill on current dose of Zoloft, and a message sent to Dr Lucia GaskinsAhern to review upon her return regarding a possible increase.  Explained she is out of the office this week.  Patient expressed understanding.

## 2015-06-06 NOTE — Telephone Encounter (Signed)
Can increase to 100mg  daily. dispese 30 and refill 11 times. Do you mind taking care of it Shanda BumpsJessica? THANK YOU!

## 2015-06-06 NOTE — Telephone Encounter (Signed)
Pt called and is needing a refill on sertraline (ZOLOFT) 50 MG tablet. However, she says that it is not lasting all day like it should. She is still having anxiety attacks and would like to know if the medication can be increased. Please call and advise [phone]

## 2015-06-06 NOTE — Telephone Encounter (Signed)
Rx has been updated and sent per instruction.  I called back and spoke with the patient.  She expressed understanding and appreciation.

## 2015-08-02 ENCOUNTER — Encounter: Payer: Self-pay | Admitting: Neurology

## 2015-08-02 ENCOUNTER — Ambulatory Visit (INDEPENDENT_AMBULATORY_CARE_PROVIDER_SITE_OTHER): Payer: Commercial Managed Care - PPO | Admitting: Neurology

## 2015-08-02 VITALS — BP 147/94 | HR 70 | Resp 20 | Ht 60.0 in | Wt 253.0 lb

## 2015-08-02 DIAGNOSIS — G890 Central pain syndrome: Secondary | ICD-10-CM | POA: Diagnosis not present

## 2015-08-02 DIAGNOSIS — G43809 Other migraine, not intractable, without status migrainosus: Secondary | ICD-10-CM

## 2015-08-02 DIAGNOSIS — M79641 Pain in right hand: Secondary | ICD-10-CM

## 2015-08-02 DIAGNOSIS — G43909 Migraine, unspecified, not intractable, without status migrainosus: Secondary | ICD-10-CM | POA: Diagnosis not present

## 2015-08-02 MED ORDER — SERTRALINE HCL 100 MG PO TABS: 100.0000 mg | ORAL_TABLET | Freq: Every day | ORAL | Status: DC

## 2015-08-02 MED ORDER — SUMATRIPTAN SUCCINATE 11 MG/NOSEPC NA EXHP: 2.0000 | INHALANT_POWDER | Freq: Once | NASAL | Status: DC

## 2015-08-02 MED ORDER — SERTRALINE HCL 50 MG PO TABS: 50.0000 mg | ORAL_TABLET | Freq: Every day | ORAL | Status: DC

## 2015-08-02 MED ORDER — TRAMADOL HCL 50 MG PO TABS: 50.0000 mg | ORAL_TABLET | Freq: Four times a day (QID) | ORAL | Status: DC | PRN

## 2015-08-02 MED ORDER — TOPIRAMATE 50 MG PO TABS: 50.0000 mg | ORAL_TABLET | Freq: Two times a day (BID) | ORAL | Status: DC

## 2015-08-02 MED ORDER — GABAPENTIN 300 MG PO CAPS: 300.0000 mg | ORAL_CAPSULE | Freq: Three times a day (TID) | ORAL | Status: DC

## 2015-08-02 NOTE — Patient Instructions (Signed)
Remember to drink plenty of fluid, eat healthy meals and do not skip any meals. Try to eat protein with a every meal and eat a healthy snack such as fruit or nuts in between meals. Try to keep a regular sleep-wake schedule and try to exercise daily, particularly in the form of walking, 20-30 minutes a day, if you can.   As far as your medications are concerned, I would like to suggest: Continue Topamax Onzetra at onset of headache. May repeat in 2 hours Tramadol or neurontin for pain Increase Zoloft to 150mg  (100mg  + 50mg ) once daily  I would like to see you back in 6 months, sooner if we need to. Please call us with any interim questions, concerns, problems, updates or refill requests.   Our phone number is [phone]. We also have an after hours call service for urgent matters and there is a physician on-call for urgent questions. For any emergencies you know to call 911 or go to the nearest emergency room

## 2015-08-02 NOTE — Progress Notes (Signed)
[facility]    Provider:  Dr Lucia GaskinsAhern Referring Provider: Avis EpleyJackson, Samantha J, PA* Primary Care Physician:  Pershing ProudJACKSON,SAMANTHA, PA-C  CC: Right-sided numbness  Interval update 08/02/2015: She has had one migraine since I last saw her. It was very bad. Alleve didn't terminate. She had to go to bed. No side effects from the Topamax. He is having a lot of stress in her life and episodes of throat tightening with stress at home, severe anxiety. Zoloft not helping. Discussed increasing vs. Changing medication. She still has pain in her right sided symptoms due to the left thalamic lesions. Discussed Thalamic pain syndrome.   HPI: Elmer SowStacy M Underwood is a 35 y.o. female here as a follow up  She is still having right sided numbness. It starts in the jaw bone, jaw line and includes the right arm and right leg. And also includes the right half of the entire thorax, abdomen and pelvis. Literally right in half all the way down the body. The face has improved a little bit but the rest of her body on the right is still completely numbn. Weakness as well. No neck pain. She gets neck tightness.    MRI brain 11/18/2014 IMPRESSION:  Equivocal MRI brain (without) demonstrating: 1. Minimal periventricular and ~4-5 punctate subcortical foci of non-specific gliosis. 2. 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 3. No acute findings.  MRI c-spine 02/2015 was normal  PMHx migraines.  Having episodic right-sided numbness including the face, arm and leg. Started Easter Sunday with stuttering (27th) then went to the ED on the 29th and MRI did not show acute stroke. No headache. On the 29th the symptoms became persistent and have remained persistent. Now she has muscle contractions/cramps. She can't hod hold a pen, can't do anything due to tightness. Not painful, just feels like the side is asleep. Feels like needles is sticking her.  The last 2 morning the right side wouldn't move, weakness as well. Getting worse, progressive. Nothing makes it better. The muscle relaxers don't help. It just "knocks her out". She feels like popeye like she laying cockeyed. She is getting very anxious over the symptoms.   Reviewed notes, labs and imaging from outside physicians, which showed: fIncomplete MRI of the brain but images (personally reviewed images) showed nothing acute Urine drug screen negative. CMP unremarkable.    Review of Systems: Patient complains of symptoms per HPI as well as the following symptoms: headache, numbness, weakness, confusion, nervous/anxiety Pertinent negatives per HPI. All others negative.   Social History   Social History  . Marital Status: Divorced    Spouse Name: N/A  . Number of Children: 2  . Years of Education: 12   Occupational History  . [employer] file    Social History Main Topics  . Smoking status: Never Smoker   . Smokeless tobacco: Never Used  . Alcohol Use: No  . Drug Use: No  . Sexual Activity: Yes    Birth Control/ Protection: None   Other Topics Concern  . Not on file   Social History Narrative   Lives at home with boyfriend and 4 kids.   Right handed.   Caffeine use:  Drinks 20 ounces per week [name](Soda)        Family History  Problem Relation Age of Onset  . Stroke Maternal Grandmother   . Heart attack Brother   . Heart attack Maternal Grandmother   . Cervical cancer Mother   . Breast cancer  Sister   . Breast cancer Sister   . Breast cancer Sister     Past Medical History  Diagnosis Date  . Hypertension   . Headache     Past Surgical History  Procedure Laterality Date  . Cesarean section    . Tubal ligation      Current Outpatient Prescriptions  Medication Sig Dispense Refill  . aspirin 81 MG chewable tablet Chew 1 tablet (81 mg total) by mouth daily. 30 tablet 0  . Cyanocobalamin (VITAMIN B-12 PO) Take by mouth.     Marland Kitchen lisinopril-hydrochlorothiazide  (PRINZIDE,ZESTORETIC) 10-12.5 MG tablet TK 1 T PO D  1  . sertraline (ZOLOFT) 100 MG tablet Take 1 tablet (100 mg total) by mouth daily. 30 tablet 11  . topiramate (TOPAMAX) 50 MG tablet Take 1 tablet (50 mg total) by mouth 2 (two) times daily. 60 tablet 6   No current facility-administered medications for this visit.    Allergies as of 08/02/2015 - Review Complete 08/02/2015  Allergen Reaction Noted  . Celexa [citalopram hydrobromide] Anxiety 02/06/2015    Vitals: BP 147/94 mmHg  Pulse 70  Resp 20  Ht 5' (1.524 m)  Wt 253 lb (114.76 kg)  BMI 49.41 kg/m2 Last Weight:  Wt Readings from Last 1 Encounters:  08/02/15 253 lb (114.76 kg)   Last Height:   Ht Readings from Last 1 Encounters:  08/02/15 5' (1.524 m)     Exam: Gen: NAD, conversant, well nourised, obese, well groomed  CV: RRR, no MRG. No Carotid Bruits. No peripheral edema, warm, nontender Eyes: Conjunctivae clear without exudates or hemorrhage  Neuro: Detailed Neurologic Exam  Speech:  Speech is normal; fluent and spontaneous with normal comprehension.  Cognition:  The patient is oriented to person, place, and time;  Cranial Nerves:  The pupils are equal, round, and reactive to light. Visual fields are full to finger confrontation. Extraocular movements are intact. Trigeminal sensation is intact and the muscles of mastication are normal. The face is symmetric. The palate elevates in the midline. Hearing intact. Voice is normal. Shoulder shrug is normal. The tongue has normal motion without fasciculations.   Motor Observation:  Mild postural tremor Tone:  Normal muscle tone.   Posture:  Posture is normal.    Strength: Right pronator drift. Right proximal weakness. Otherwise strength is V/V in the upper and lower limbs.    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   Migraines: Continue Topamax. Onzetra for acute management.  Right hand numbness : emg/ncs Thalamic pain syndrome: neurontin and Tramadol. Watch for sedation. Do not drive.  Stress/anxiety: Increase Zoloft to 150mg  daily F/u 6 months or sonner if needed  Naomie Dean, MD  [facility] [address] [place], [place] [address]  Phone [phone] Fax [phone]  A total of 40 minutes was spent face-to-face with this patient. Over half this time was spent on counseling patient on the migraine and thalamic pain syndrome diagnosis and different diagnostic and therapeutic options available.

## 2015-08-09 ENCOUNTER — Ambulatory Visit: Payer: Commercial Managed Care - PPO | Admitting: Neurology

## 2015-08-29 ENCOUNTER — Encounter: Payer: Commercial Managed Care - PPO | Admitting: Neurology

## 2015-09-12 ENCOUNTER — Ambulatory Visit (INDEPENDENT_AMBULATORY_CARE_PROVIDER_SITE_OTHER): Payer: Commercial Managed Care - PPO | Admitting: Neurology

## 2015-09-12 ENCOUNTER — Ambulatory Visit (INDEPENDENT_AMBULATORY_CARE_PROVIDER_SITE_OTHER): Payer: Self-pay | Admitting: Neurology

## 2015-09-12 DIAGNOSIS — Z0289 Encounter for other administrative examinations: Secondary | ICD-10-CM

## 2015-09-12 DIAGNOSIS — R200 Anesthesia of skin: Secondary | ICD-10-CM

## 2015-09-12 DIAGNOSIS — M79641 Pain in right hand: Secondary | ICD-10-CM

## 2015-09-12 NOTE — Procedures (Signed)
  [facility]    Provider:  Dr Lucia Gaskins Referring Provider: Avis Epley, PA* Primary Care Physician:  Pershing Proud   History :  Nicole Travis is a 35 y.o. female here as a referral from Dr. Jean Rosenthal for right-sided numbness. Right arm most affected.  Summary  Nerve conduction studies were performed on the right upper and right lower extremities:  Right APB Median motor nerve showed normal conductions with normal F Wave latency Right ADM Ulnar motor nerve showed normal conductions with normal F Wave latency Right Peroneal motor nerve showed normal conductions with normal F Wave latency Right Tibial motor nerve showed normal conductions with normal F Wave latency Right second-digit Median sensory nerve conduction was within normal limits Right fifth-digit Ulnar sensory nerve conduction was within normal limits Right Sural sensory nerve conduction was within normal limits Bilateral H Reflexes showed normal latencies  EMG Needle study was performed on selected right upper extremity muscles:   The Deltoid, Triceps, Pronator Teres, Opponens Pollicis, Flexor Digitorum Profundus, first dorsal interosseous muscles were within normal limits.  Conclusion: This is a normal study. No electrophysiologic evidence for peripheral polyneuropathy, mononeuropathy, radiculopathy, or neuromuscular disorder.

## 2015-09-12 NOTE — Progress Notes (Signed)
See procedure note.

## 2015-09-12 NOTE — Progress Notes (Signed)
  [facility]    Provider:  Dr Jora Galluzzo Referring Provider: Jackson, Samantha J, PA* Primary Care Physician:  JACKSON,SAMANTHA, PA-C   History :  Nicole Travis is a 34 y.o. female here as a referral from Dr. Jackson for right-sided numbness. Right arm most affected.  Summary  Nerve conduction studies were performed on the right upper and right lower extremities:  Right APB Median motor nerve showed normal conductions with normal F Wave latency Right ADM Ulnar motor nerve showed normal conductions with normal F Wave latency Right Peroneal motor nerve showed normal conductions with normal F Wave latency Right Tibial motor nerve showed normal conductions with normal F Wave latency Right second-digit Median sensory nerve conduction was within normal limits Right fifth-digit Ulnar sensory nerve conduction was within normal limits Right Sural sensory nerve conduction was within normal limits Bilateral H Reflexes showed normal latencies  EMG Needle study was performed on selected right upper extremity muscles:   The Deltoid, Triceps, Pronator Teres, Opponens Pollicis, Flexor Digitorum Profundus, first dorsal interosseous muscles were within normal limits.  Conclusion: This is a normal study. No electrophysiologic evidence for peripheral polyneuropathy, mononeuropathy, radiculopathy, or neuromuscular disorder.  

## 2015-11-30 ENCOUNTER — Emergency Department (HOSPITAL_COMMUNITY)
Admission: EM | Admit: 2015-11-30 | Discharge: 2015-11-30 | Disposition: A | Discharge status: Home / Self Care | Payer: Commercial Managed Care - PPO | Attending: Dermatology | Admitting: Dermatology

## 2015-11-30 ENCOUNTER — Encounter (HOSPITAL_COMMUNITY): Payer: Self-pay | Admitting: Emergency Medicine

## 2015-11-30 DIAGNOSIS — I10 Essential (primary) hypertension: Secondary | ICD-10-CM | POA: Diagnosis not present

## 2015-11-30 DIAGNOSIS — L0201 Cutaneous abscess of face: Secondary | ICD-10-CM | POA: Diagnosis not present

## 2015-11-30 DIAGNOSIS — Z8673 Personal history of transient ischemic attack (TIA), and cerebral infarction without residual deficits: Secondary | ICD-10-CM | POA: Diagnosis not present

## 2015-11-30 DIAGNOSIS — Z5321 Procedure and treatment not carried out due to patient leaving prior to being seen by health care provider: Secondary | ICD-10-CM | POA: Insufficient documentation

## 2015-11-30 HISTORY — DX: Cerebral infarction, unspecified: I63.9

## 2015-11-30 NOTE — ED Notes (Signed)
Pt told registration she was leaving.  

## 2015-11-30 NOTE — ED Notes (Signed)
Pt reports abscess to chin that started 2 days ago. Pt has redness spreading to her neck and L face.

## 2015-12-01 ENCOUNTER — Encounter (HOSPITAL_COMMUNITY): Payer: Self-pay | Admitting: *Deleted

## 2015-12-01 ENCOUNTER — Observation Stay (HOSPITAL_COMMUNITY)
Admission: EM | Admit: 2015-12-01 | Discharge: 2015-12-03 | Disposition: A | Discharge status: Home / Self Care | Payer: Commercial Managed Care - PPO | Attending: Family Medicine | Admitting: Family Medicine

## 2015-12-01 ENCOUNTER — Emergency Department (HOSPITAL_COMMUNITY): Payer: Commercial Managed Care - PPO

## 2015-12-01 DIAGNOSIS — I10 Essential (primary) hypertension: Secondary | ICD-10-CM | POA: Diagnosis not present

## 2015-12-01 DIAGNOSIS — L0201 Cutaneous abscess of face: Secondary | ICD-10-CM | POA: Diagnosis not present

## 2015-12-01 DIAGNOSIS — Z79899 Other long term (current) drug therapy: Secondary | ICD-10-CM | POA: Diagnosis not present

## 2015-12-01 DIAGNOSIS — L0291 Cutaneous abscess, unspecified: Secondary | ICD-10-CM

## 2015-12-01 DIAGNOSIS — L03211 Cellulitis of face: Principal | ICD-10-CM | POA: Insufficient documentation

## 2015-12-01 DIAGNOSIS — L0390 Cellulitis, unspecified: Secondary | ICD-10-CM

## 2015-12-01 LAB — I-STAT CHEM 8, ED
BUN: 11 mg/dL (ref 6–20)
CHLORIDE: 105 mmol/L (ref 101–111)
CREATININE: 0.7 mg/dL (ref 0.44–1.00)
Calcium, Ion: 1.18 mmol/L (ref 1.12–1.23)
Glucose, Bld: 106 mg/dL — ABNORMAL HIGH (ref 65–99)
HEMATOCRIT: 42 % (ref 36.0–46.0)
Hemoglobin: 14.3 g/dL (ref 12.0–15.0)
POTASSIUM: 3.9 mmol/L (ref 3.5–5.1)
Sodium: 143 mmol/L (ref 135–145)
TCO2: 24 mmol/L (ref 0–100)

## 2015-12-01 LAB — CBC WITH DIFFERENTIAL/PLATELET
Basophils Absolute: 0 10*3/uL (ref 0.0–0.1)
Basophils Relative: 0 %
EOS PCT: 2 %
Eosinophils Absolute: 0.2 10*3/uL (ref 0.0–0.7)
HCT: 40.2 % (ref 36.0–46.0)
HEMOGLOBIN: 13.2 g/dL (ref 12.0–15.0)
LYMPHS ABS: 2.3 10*3/uL (ref 0.7–4.0)
LYMPHS PCT: 20 %
MCH: 30.2 pg (ref 26.0–34.0)
MCHC: 32.8 g/dL (ref 30.0–36.0)
MCV: 92 fL (ref 78.0–100.0)
Monocytes Absolute: 0.6 10*3/uL (ref 0.1–1.0)
Monocytes Relative: 5 %
Neutro Abs: 8.3 10*3/uL — ABNORMAL HIGH (ref 1.7–7.7)
Neutrophils Relative %: 73 %
PLATELETS: 275 10*3/uL (ref 150–400)
RBC: 4.37 MIL/uL (ref 3.87–5.11)
RDW: 13.7 % (ref 11.5–15.5)
WBC: 11.5 10*3/uL — AB (ref 4.0–10.5)

## 2015-12-01 LAB — BASIC METABOLIC PANEL
Anion gap: 4 — ABNORMAL LOW (ref 5–15)
BUN: 13 mg/dL (ref 6–20)
CHLORIDE: 108 mmol/L (ref 101–111)
CO2: 25 mmol/L (ref 22–32)
Calcium: 8.6 mg/dL — ABNORMAL LOW (ref 8.9–10.3)
Creatinine, Ser: 0.77 mg/dL (ref 0.44–1.00)
GFR calc Af Amer: >60 mL/min (ref >60)
GFR calc non Af Amer: >60 mL/min (ref >60)
GLUCOSE: 112 mg/dL — AB (ref 65–99)
POTASSIUM: 3.4 mmol/L — AB (ref 3.5–5.1)
Sodium: 137 mmol/L (ref 135–145)

## 2015-12-01 LAB — I-STAT BETA HCG BLOOD, ED (MC, WL, AP ONLY): I-stat hCG, quantitative: <5 m[IU]/mL (ref <5)

## 2015-12-01 MED ORDER — IOPAMIDOL (ISOVUE-300) INJECTION 61%
75.0000 mL | Freq: Once | INTRAVENOUS | Status: AC | PRN
  Administered 2015-12-01: 75 mL via INTRAVENOUS

## 2015-12-01 MED ORDER — VANCOMYCIN HCL 10 G IV SOLR
2000.0000 mg | Freq: Once | INTRAVENOUS | Status: AC
  Administered 2015-12-01: 2000 mg via INTRAVENOUS
  Filled 2015-12-01: qty 2000

## 2015-12-01 MED ORDER — HYDROCHLOROTHIAZIDE 25 MG PO TABS
25.0000 mg | ORAL_TABLET | Freq: Every day | ORAL | Status: DC
  Administered 2015-12-02: 25 mg via ORAL
  Filled 2015-12-01: qty 1

## 2015-12-01 MED ORDER — HYDROCODONE-ACETAMINOPHEN 5-325 MG PO TABS
1.0000 | ORAL_TABLET | ORAL | Status: DC | PRN
  Administered 2015-12-01 – 2015-12-02 (×4): 2 via ORAL
  Filled 2015-12-01 (×4): qty 2

## 2015-12-01 MED ORDER — GABAPENTIN 300 MG PO CAPS: 300.0000 mg | ORAL_CAPSULE | Freq: Three times a day (TID) | ORAL | Status: DC | PRN

## 2015-12-01 MED ORDER — SERTRALINE HCL 50 MG PO TABS
100.0000 mg | ORAL_TABLET | Freq: Every day | ORAL | Status: DC
  Administered 2015-12-01 – 2015-12-02 (×2): 100 mg via ORAL
  Filled 2015-12-01 (×2): qty 2

## 2015-12-01 MED ORDER — MORPHINE SULFATE (PF) 4 MG/ML IV SOLN
4.0000 mg | Freq: Once | INTRAVENOUS | Status: AC
  Administered 2015-12-01: 4 mg via INTRAVENOUS
  Filled 2015-12-01: qty 1

## 2015-12-01 MED ORDER — SERTRALINE HCL 50 MG PO TABS
50.0000 mg | ORAL_TABLET | Freq: Every day | ORAL | Status: DC
  Administered 2015-12-01 – 2015-12-02 (×2): 50 mg via ORAL
  Filled 2015-12-01 (×2): qty 1

## 2015-12-01 MED ORDER — LISINOPRIL 10 MG PO TABS
10.0000 mg | ORAL_TABLET | Freq: Every day | ORAL | Status: DC
  Administered 2015-12-02: 10 mg via ORAL
  Filled 2015-12-01: qty 1

## 2015-12-01 MED ORDER — ACETAMINOPHEN 650 MG RE SUPP: 650.0000 mg | Freq: Four times a day (QID) | RECTAL | Status: DC | PRN

## 2015-12-01 MED ORDER — VANCOMYCIN HCL IN DEXTROSE 1-5 GM/200ML-% IV SOLN
1000.0000 mg | Freq: Three times a day (TID) | INTRAVENOUS | Status: DC
  Administered 2015-12-01 – 2015-12-03 (×5): 1000 mg via INTRAVENOUS
  Filled 2015-12-01 (×5): qty 200

## 2015-12-01 MED ORDER — IBUPROFEN 400 MG PO TABS: 400.0000 mg | ORAL_TABLET | Freq: Four times a day (QID) | ORAL | Status: DC | PRN

## 2015-12-01 MED ORDER — CLINDAMYCIN PHOSPHATE 600 MG/50ML IV SOLN
600.0000 mg | Freq: Once | INTRAVENOUS | Status: AC
  Administered 2015-12-01: 600 mg via INTRAVENOUS
  Filled 2015-12-01: qty 50

## 2015-12-01 MED ORDER — SODIUM CHLORIDE 0.9 % IV SOLN: 250.0000 mL | INTRAVENOUS | Status: DC | PRN

## 2015-12-01 MED ORDER — TRAMADOL HCL 50 MG PO TABS: 50.0000 mg | ORAL_TABLET | Freq: Four times a day (QID) | ORAL | Status: DC | PRN

## 2015-12-01 MED ORDER — SODIUM CHLORIDE 0.9% FLUSH
3.0000 mL | Freq: Two times a day (BID) | INTRAVENOUS | Status: DC
  Administered 2015-12-01 – 2015-12-02 (×4): 3 mL via INTRAVENOUS

## 2015-12-01 MED ORDER — SODIUM CHLORIDE 0.9% FLUSH: 3.0000 mL | INTRAVENOUS | Status: DC | PRN

## 2015-12-01 MED ORDER — DIPHENHYDRAMINE HCL 25 MG PO CAPS
25.0000 mg | ORAL_CAPSULE | Freq: Four times a day (QID) | ORAL | Status: DC | PRN
  Administered 2015-12-01: 25 mg via ORAL
  Filled 2015-12-01: qty 1

## 2015-12-01 MED ORDER — SODIUM CHLORIDE 0.9 % IV BOLUS (SEPSIS)
1000.0000 mL | Freq: Once | INTRAVENOUS | Status: AC
  Administered 2015-12-01: 1000 mL via INTRAVENOUS

## 2015-12-01 MED ORDER — ONDANSETRON HCL 4 MG PO TABS: 4.0000 mg | ORAL_TABLET | Freq: Four times a day (QID) | ORAL | Status: DC | PRN

## 2015-12-01 MED ORDER — ONDANSETRON HCL 4 MG/2ML IJ SOLN
4.0000 mg | Freq: Once | INTRAMUSCULAR | Status: AC
  Administered 2015-12-01: 4 mg via INTRAVENOUS
  Filled 2015-12-01: qty 2

## 2015-12-01 MED ORDER — TOPIRAMATE 25 MG PO TABS
50.0000 mg | ORAL_TABLET | Freq: Two times a day (BID) | ORAL | Status: DC
  Administered 2015-12-01 – 2015-12-02 (×4): 50 mg via ORAL
  Filled 2015-12-01 (×7): qty 2

## 2015-12-01 MED ORDER — ACETAMINOPHEN 325 MG PO TABS: 650.0000 mg | ORAL_TABLET | Freq: Four times a day (QID) | ORAL | Status: DC | PRN

## 2015-12-01 MED ORDER — KETOROLAC TROMETHAMINE 30 MG/ML IJ SOLN
30.0000 mg | Freq: Once | INTRAMUSCULAR | Status: AC
  Administered 2015-12-01: 30 mg via INTRAVENOUS
  Filled 2015-12-01: qty 1

## 2015-12-01 MED ORDER — ONDANSETRON HCL 4 MG/2ML IJ SOLN: 4.0000 mg | Freq: Four times a day (QID) | INTRAMUSCULAR | Status: DC | PRN

## 2015-12-01 NOTE — ED Provider Notes (Signed)
TIME SEEN: 5:45 AM  CHIEF COMPLAINT: Abscess and cellulitis  HPI: Pt is a 35 y.o. morbidly obese female with history of hypertension, migraines who presents to the emergency department with complaints of a pimple to her chin that she noticed 4 days ago. She states that she popped it and had a small amount of serosanguineous drainage. She noticed that the swelling and redness progressively got larger. She states yesterday she popped it again and had purulent drainage. States that she came to the emergency department but because of the wait decided to leave without being seen. Woke up this morning and had swelling to the lower left lip and underneath her chin. She is able to swallow but is in pain. Denies any fevers. Denies history of diabetes or being immunocompromised. States it is hard to talk because of the pain.  ROS: See HPI Constitutional: no fever  Eyes: no drainage  ENT: no runny nose   Cardiovascular:  no chest pain  Resp: no SOB  GI: no vomiting GU: no dysuria Integumentary: no rash  Allergy: no hives  Musculoskeletal: no leg swelling  Neurological: no slurred speech ROS otherwise negative  PAST MEDICAL HISTORY/PAST SURGICAL HISTORY:  Past Medical History  Diagnosis Date  . Hypertension   . Headache   . Stroke [facility](HCC)     MEDICATIONS:  Prior to Admission medications   Medication Sig Start Date End Date Taking? Authorizing Provider  Cyanocobalamin (VITAMIN B-12 PO) Take by mouth.     Historical Provider, MD  gabapentin (NEURONTIN) 300 MG capsule Take 1 capsule (300 mg total) by mouth 3 (three) times daily. 08/02/15   Anson FretAntonia B Ahern, MD  lisinopril-hydrochlorothiazide (PRINZIDE,ZESTORETIC) 10-12.5 MG tablet TK 1 T PO D 06/28/15   Historical Provider, MD  sertraline (ZOLOFT) 100 MG tablet Take 1 tablet (100 mg total) by mouth daily. 08/02/15   Anson FretAntonia B Ahern, MD  sertraline (ZOLOFT) 50 MG tablet Take 1 tablet (50 mg total) by mouth daily. 08/02/15   Anson FretAntonia B Ahern, MD   SUMAtriptan Succinate (ONZETRA XSAIL) 11 MG/[facility] 2 sprays into the nose once. May repeat in 2 hours 08/02/15   Anson FretAntonia B Ahern, MD  topiramate (TOPAMAX) 50 MG tablet Take 1 tablet (50 mg total) by mouth 2 (two) times daily. 08/02/15   Anson FretAntonia B Ahern, MD  traMADol (ULTRAM) 50 MG tablet Take 1 tablet (50 mg total) by mouth every 6 (six) hours as needed. 08/02/15   Anson FretAntonia B Ahern, MD    ALLERGIES:  Allergies  Allergen Reactions  . Celexa [Citalopram Hydrobromide] Anxiety    SOCIAL HISTORY:  Social History  Substance Use Topics  . Smoking status: Never Smoker   . Smokeless tobacco: Never Used  . Alcohol Use: No    FAMILY HISTORY: Family History  Problem Relation Age of Onset  . Stroke Maternal Grandmother   . Heart attack Brother   . Heart attack Maternal Grandmother   . Cervical cancer Mother   . Breast cancer Sister   . Breast cancer Sister   . Breast cancer Sister     EXAM: BP 161/94 mmHg  Pulse 84  Temp(Src) 98.2 F (36.8 C) (Oral)  Resp 22  Ht 5' (1.524 m)  Wt 256 lb (116.121 kg)  BMI 50.00 kg/m2  SpO2 97%  LMP 11/30/2015 (Exact Date) CONSTITUTIONAL: Alert and oriented and responds appropriately to questions. Obese, crying, afebrile HEAD: Normocephalic EYES: Conjunctivae clear, PERRL ENT: normal nose; no rhinorrhea; moist mucous membranes; No pharyngeal erythema or petechiae,  no tonsillar hypertrophy or exudate, no uvular deviation, no trismus or drooling, normal phonation, no stridor, no dental caries or abscess noted, appears to sit flat in the bottom of the mouth, she does have swelling to the left lower lip on exam, she has an area of redness with a punctate center without active drainage and no obvious fluctuance that is extremely tender to palpation. This redness around her chin streaks down her neck and she does have lots of tenderness throughout the neck and some induration. NECK: Supple, no meningismus, no LAD  CARD: RRR; S1 and S2 appreciated;  no murmurs, no clicks, no rubs, no gallops RESP: Normal chest excursion without splinting or tachypnea; breath sounds clear and equal bilaterally; no wheezes, no rhonchi, no rales, no hypoxia or respiratory distress, speaking full sentences ABD/GI: Normal bowel sounds; non-distended; soft, non-tender, no rebound, no guarding, no peritoneal signs BACK:  The back appears normal and is non-tender to palpation, there is no CVA tenderness EXT: Normal ROM in all joints; non-tender to palpation; no edema; normal capillary refill; no cyanosis, no calf tenderness or swelling    SKIN: Normal color for age and race; warm; no rash NEURO: Moves all extremities equally, sensation to light touch intact diffusely, cranial nerves II through XII intact PSYCH: The patient's mood and manner are appropriate. Grooming and personal hygiene are appropriate.  MEDICAL DECISION MAKING: Patient here with abscess of the chin and facial cellulitis. 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 Her timing however does appear to sit flat in the bottom of her mouth but her exam is limited as she is very tearful and in pain. We'll give pain medication, IV antibiotics, IV fluids. Will obtain labs, CT of her soft tissues of her neck. At this time I do not appreciate any fluctuance that suggests there is a drainable fluid collection in her chin.   ED PROGRESS: 7:20 AM  Pt's CT shows a small fluid collection in the chin. I feel that this is too small to drain and patient at this time does not want [place] to cut her face. She does have extensive cellulitis that goes down her neck but no airway compromise. She appears more comfortable and is less tearful after pain medication. Given she is at risk for airway compromise given her neck cellulitis, beginning of Ludwig's angina I feel she will need admission. Discussed patient's case with hospitalist, Dr.  Irene Limbo.  Recommend admission to observation, medical bed.  I will place holding orders per their request. Patient and family (if present) updated with plan. Care transferred to hospitalist service.  I reviewed all nursing notes, vitals, pertinent old records, EKGs, labs, imaging (as available).      Layla Maw Ward, DO 12/01/15 [phone]

## 2015-12-01 NOTE — ED Notes (Signed)
Pt c/o pain and redness to her chin, radiating to her neck; pt states she had a "pimple" on her chin x 4 days ago that she popped and since then it has gotten progressively bigger

## 2015-12-01 NOTE — H&P (Signed)
History and Physical  Nicole Travis ZOX:[id number] DOB: 04/26/1981 DOA: 12/01/2015  Referring physician: Rochele Raring, DO PCP: Pershing Proud   Chief Complaint: Abscess and cellulitis   HPI:  35 year old woman presented with facial swelling and pain. CT subjects that cellulitis without abscess. There is no evidence of complicating features.  Pain onset 5/8 after she popped the pimple on her face. She reports waking to increased swelling and pain and by 5/10 entire face was swollen. Took ibuprofen with mild pain relief. On 5/11 she was unable to speak due to swelling. When she woke up this morning the swelling and pain was considerable increased and she was unable to move her neck or open her mouth. She denies any similar infections. Admits abx one mouth ago from dental infection, resolved with no issues. Denies fever, changes to vision, chest pain, NVD, or SOB.  No difficulty swallowing.  In the emergency department VSS  Pertinent labs: BMP unremarkable, WBC 11.5 Imaging: CT Moderate inflammatory change throughout the submental/submandibular region consistent with cellulitis. No abscess or floor of mouth involvement.  Review of Systems:  Positive for facial pain, Negative for fever, visual changes, sore throat, rash, new muscle aches, chest pain, SOB, dysuria, bleeding, n/v/abdominal pain.  Past Medical History  Diagnosis Date  . Hypertension   . Headache   . Stroke [facility])     Past Surgical History  Procedure Laterality Date  . Cesarean section    . Tubal ligation      Social History:  reports that she has never smoked. She has never used smokeless tobacco. She reports that she does not drink alcohol or use illicit drugs. Self-care  Allergies  Allergen Reactions  . Celexa [Citalopram Hydrobromide] Anxiety    Family History  Problem Relation Age of Onset  . Stroke Maternal Grandmother   . Heart attack Brother   . Heart attack Maternal Grandmother   . Cervical  cancer Mother   . Breast cancer Sister   . Breast cancer Sister   . Breast cancer Sister      Prior to Admission medications   Medication Sig Start Date End Date Taking? Authorizing Provider  Cyanocobalamin (VITAMIN B-12 PO) Take by mouth.     Historical Provider, MD  gabapentin (NEURONTIN) 300 MG capsule Take 1 capsule (300 mg total) by mouth 3 (three) times daily. 08/02/15   Anson Fret, MD  lisinopril-hydrochlorothiazide (PRINZIDE,ZESTORETIC) 10-12.5 MG tablet TK 1 T PO D 06/28/15   Historical Provider, MD  sertraline (ZOLOFT) 100 MG tablet Take 1 tablet (100 mg total) by mouth daily. 08/02/15   Anson Fret, MD  sertraline (ZOLOFT) 50 MG tablet Take 1 tablet (50 mg total) by mouth daily. 08/02/15   Anson Fret, MD  SUMAtriptan Succinate (ONZETRA XSAIL) 11 MG/NOSEPC [facility] Place 2 sprays into the nose once. May repeat in 2 hours 08/02/15   Anson Fret, MD  topiramate (TOPAMAX) 50 MG tablet Take 1 tablet (50 mg total) by mouth 2 (two) times daily. 08/02/15   Anson Fret, MD  traMADol (ULTRAM) 50 MG tablet Take 1 tablet (50 mg total) by mouth every 6 (six) hours as needed. 08/02/15   Anson Fret, MD   Physical Exam: Filed Vitals:   12/01/15 8119 12/01/15 0838 12/01/15 0905 12/01/15 1404  BP: 138/88 139/83 146/88   Pulse: 72 75 70 74  Temp: 98 F (36.7 C)  98 F (36.7 C) 98.2 F (36.8 C)  TempSrc: Oral  Oral Oral  Resp:  Height:   5' (1.524 m)   Weight:   119.115 kg (262 lb 9.6 oz)   SpO2: 95% 97% 97% 97%    Constitutional:  . Appears calm and comfortable Eyes:  . PERRL and irises appear normal . Normal conjunctivae and lids ENMT:  . external ears, nose appear normal . grossly normal hearing . Able to open her mouth easily, excellent view of the oropharynx. Left tonsil is enlarged, no ulcers. Oropharynx appears unremarkable. Poor dentition with broken teeth especially left mandible. Mouth appears otherwise unremarkable. . Lips appear  normal Respiratory:  . CTA bilaterally, no w/r/r.  . Respiratory effort normal. No retractions or accessory muscle use Cardiovascular:  . RRR, no m/r/g . No LE extremity edema   Abdomen:  . Abdomen appears normal; no tenderness or masses . No hernias Skin:  . Chin erythematous with swelling on the left side. Small wound has a scab over it. Submental fullness with erythema and warmth, but no fluctuance. Full range of movement of the neck. Psychiatric:  . judgement and insight appear normal . Mental status o Mood, affect appropriate  Wt Readings from Last 3 Encounters:  12/01/15 119.115 kg (262 lb 9.6 oz)  11/30/15 116.121 kg (256 lb)  08/02/15 114.76 kg (253 lb)    Labs on Admission:  Basic Metabolic Panel:  Recent Labs Lab 12/01/15 0551 12/01/15 0624  NA 137 143  K 3.4* 3.9  CL 108 105  CO2 25  --   GLUCOSE 112* 106*  BUN 13 11  CREATININE 0.77 0.70  CALCIUM 8.6*  --    CBC:  Recent Labs Lab 12/01/15 0551 12/01/15 0624  WBC 11.5*  --   NEUTROABS 8.3*  --   HGB 13.2 14.3  HCT 40.2 42.0  MCV 92.0  --   PLT 275  --        Radiological Exams on Admission: Ct Soft Tissue Neck W Contrast  12/01/2015  CLINICAL DATA:  Abscess with cellulitis into the neck. Pain and redness in the chin and neck, progressively worsening after popping a pimple 4 days ago. 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 EXAM: CT NECK WITH CONTRAST TECHNIQUE: Multidetector CT imaging of the neck was performed using the standard protocol following the bolus administration of intravenous contrast. CONTRAST:  75mL ISOVUE-300 IOPAMIDOL (ISOVUE-300) INJECTION 61% COMPARISON:  Neck CTA 10/18/2014 FINDINGS: Pharynx and larynx: The there is mild-to-moderate asymmetric soft tissue fullness involving the left palatine tonsil. Some asymmetry was present on the prior CTA, however this is more prominent on the current examination. No discrete mass or the parapharyngeal inflammatory change is identified. The larynx is  unremarkable. Mild-to-moderate inflammatory fat stranding is present in the submental region predominantly involving the subcutaneous tissues though also with anteroinferior submandibular space involvement as well. There is thickening of the platysma bilaterally. No significant sublingual space inflammatory changes seen, and no fluid collection is identified. Multiple dental caries are again seen with similar appearance of periapical lucency involving the left mandibular first molar extending through the buccal cortex. There is also slight periapical lucency involving the left mandibular third molar as well as bilateral maxillary molars. Salivary glands: No intrinsic abnormality identified of the submandibular or parotid glands. Thyroid: Unremarkable. Lymph nodes: Small submental lymph nodes measure up to 6 mm in short axis. Submandibular lymph nodes measure up to 8 mm in short axis on the left. Level V IIa lymph nodes measure up to 8 mm bilaterally. These are all benign/ reactive in appearance.  Vascular: Major vascular structures of the neck are grossly patent. Limited intracranial: Unremarkable. Visualized mastoids and paranasal sinuses: Clear. Skeleton: No acute osseous abnormality identified. Upper chest: Visualized lung apices are clear. IMPRESSION: 1. Moderate inflammatory change throughout the submental/submandibular region consistent with cellulitis. No abscess or floor of mouth involvement. 2. Left tonsillar asymmetry. Direct visualization recommended to exclude an underlying lesion. Electronically Signed   By: Sebastian AcheAllen  Grady M.D.   On: 12/01/2015 07:25      Principal Problem:   Facial cellulitis   Assessment/Plan 1. Cellulitis of neck and chin. Tonsil on the left somewhat enlarged but mouth appears unremarkable. No evidence of airway compromise, mouth infection, lipase anginal complicating features at this point. She is afebrile with modest leukocytosis. 2. Hx of stroke. Right sided residual  weakness. Stable.   Observation. Continue IV abx.  Anticipate discharge home in 24-48 hours  Code Status: Full  DVT prophylaxis: SCDs Family Communication: No family bedside Disposition Plan/Anticipated LOS: Observation. Anticipate discharge in 48 hours.   Time spent: 55 minutes  Brendia Sacksaniel Zi Newbury, MD  [facility] Direct contact:  --Via amion app OR  --[email or web address]; password [email or web address] and click  7PM-7AM contact night coverage as above  12/01/2015, 2:09 PM    By signing my name below, I, Zadie CleverlyJessica Augustus attest that this documentation has been prepared under the direction and in the presence of Brendia Sacksaniel Indalecio Malmstrom, MD Electronically signed: Zadie CleverlyJessica Augustus  1:09pm  I personally performed the services described in this documentation. All medical record entries made by the scribe were at my direction. I have reviewed the chart and agree that the record reflects my personal performance and is accurate and complete. Brendia Sacksaniel Andreana Klingerman, MD

## 2015-12-01 NOTE — Progress Notes (Signed)
Pharmacy Antibiotic Note  Nicole Travis is a 35 y.o. female admitted on 12/01/2015 with cellulitis.  Pharmacy has been consulted for Vancomycin dosing.  Plan: Vancomycin 2gm loading dose then 1gm  IV every 8 hours.  Goal trough 10-15 mcg/mL.  F/u plan and vital signs  Height: 5' (152.4 cm) Weight: 262 lb 9.6 oz (119.115 kg) IBW/kg (Calculated) : 45.5  Temp (24hrs), Avg:98.1 F (36.7 C), Min:98 F (36.7 C), Max:98.3 F (36.8 C)   Recent Labs Lab 12/01/15 0551 12/01/15 0624  WBC 11.5*  --   CREATININE 0.77 0.70    Estimated Creatinine Clearance: 115 mL/min (by C-G formula based on Cr of 0.7).    Allergies  Allergen Reactions  . Celexa [Citalopram Hydrobromide] Anxiety    Antimicrobials this admission: Clindamycin 5/12>> x 1 dose Vancomycin 5/12 >>   Dose adjustments this admission: N/A  Microbiology results: No cultures Thank you for allowing pharmacy to be a part of this patient's care.  Elder CyphersLorie Jushua Waltman, BS Pharm D, BCPS Clinical Pharmacist Pager [phone] 12/01/2015 2:20 PM

## 2015-12-02 DIAGNOSIS — L03211 Cellulitis of face: Secondary | ICD-10-CM | POA: Diagnosis not present

## 2015-12-02 LAB — BASIC METABOLIC PANEL
Anion gap: 4 — ABNORMAL LOW (ref 5–15)
BUN: 10 mg/dL (ref 6–20)
CHLORIDE: 109 mmol/L (ref 101–111)
CO2: 24 mmol/L (ref 22–32)
CREATININE: 0.54 mg/dL (ref 0.44–1.00)
Calcium: 7.6 mg/dL — ABNORMAL LOW (ref 8.9–10.3)
GFR calc Af Amer: >60 mL/min (ref >60)
GFR calc non Af Amer: >60 mL/min (ref >60)
GLUCOSE: 119 mg/dL — AB (ref 65–99)
Potassium: 3.5 mmol/L (ref 3.5–5.1)
Sodium: 137 mmol/L (ref 135–145)

## 2015-12-02 NOTE — Progress Notes (Signed)
PROGRESS NOTE  Nicole SowStacy M Underwood RUE:[id number] DOB: 19-Apr-1981 DOA: 12/01/2015 PCP: Pershing ProudJACKSON,SAMANTHA, PA-C  Brief Narrative: 35 year old woman presented with facial swelling and pain. CT subjects that cellulitis without abscess. There is no evidence of complicating features.  Assessment/Plan: 1. Cellulitis of neck and chin, overall improving with less erythema although she may be developing a drainable abscess chin. 2. Hx of stroke. Right sided residual weakness. Stable.   Continue current antibiotics. Last general surgery to evaluate for possible drainage of chin abscess.   Given clinical improvement anticipate discharge 5/14.  DVT prophylaxis: SCDs Code Status: Full Family Communication: Finance, mother bedside.  Disposition Plan: Anticipate discharge on 24 hours.   Brendia Sacksaniel Princess Karnes, MD  [facility] Direct contact:  --Via amion app OR  --[email or web address]; password [email or web address] and click  7PM-7AM contact night coverage as above 12/02/2015, 8:45 AM    Consultants:  None   Procedures:  None  Antimicrobials:  Vancomycin 5/12>.  HPI/Subjective: Still some pain and difficulty opening mouth. Some drainage from the chin overnight.  Objective: Filed Vitals:   12/01/15 0905 12/01/15 1404 12/01/15 2100 12/02/15 0628  BP: 146/88 106/48 121/71 118/73  Pulse: 70 74  61  Temp: 98 F (36.7 C) 98.2 F (36.8 C) 98.5 F (36.9 C) 97.6 F (36.4 C)  TempSrc: Oral Oral Oral Oral  Resp: 20 20 18 18   Height: 5' (1.524 m)     Weight: 119.115 kg (262 lb 9.6 oz)     SpO2: 97% 97% 97% 96%    Intake/Output Summary (Last 24 hours) at 12/02/15 0845 Last data filed at 12/01/15 1405  Gross per 24 hour  Intake    240 ml  Output      0 ml  Net    240 ml     Filed Weights   12/01/15 0543 12/01/15 0905  Weight: 116.121 kg (256 lb) 119.115 kg (262 lb 9.6 oz)    Exam:   Constitutional:  . Appears calm and comfortable, Appears better today ENMT:  . Poor dentition. Lips and tongue  appear unremarkable. Oropharynx appears clear. Floor of the mouth appears unremarkable. Able to help mouth fairly wide. No evidence of airway compromise. Respiratory:  . CTA bilaterally, no w/r/r.  . Respiratory effort normal. No retractions or accessory muscle use Cardiovascular:  . RRR, no m/r/g . No LE extremity edema   Skin:  . Decreased erythema and edema submental. .  Increased swelling left aspect of chin with some fluctuance. No active drainage. Psychiatric:  . judgement and insight appear normal . Mental status o Mood, affect appropriate o Orientation to person, place, time   I have personally reviewed following labs and imaging studies:  CMP unremarkable.   Scheduled Meds: . hydrochlorothiazide  25 mg Oral Daily  . lisinopril  10 mg Oral Daily  . sertraline  100 mg Oral Daily  . sertraline  50 mg Oral Daily  . sodium chloride flush  3 mL Intravenous Q12H  . topiramate  50 mg Oral BID  . vancomycin  1,000 mg Intravenous Q8H   Continuous Infusions:   Principal Problem:   Facial cellulitis     Time spent 15 minutes   By signing my name below, I, Zadie CleverlyJessica Augustus attest that this documentation has been prepared under the direction and in the presence of Brendia Sacksaniel Lynden Carrithers, MD Electronically signed: Zadie CleverlyJessica Augustus  12/02/2015 10:34  I personally performed the services described in this documentation. All medical record entries made by the scribe were at my direction. I have  reviewed the chart and agree that the record reflects my personal performance and is accurate and complete. Brendia Sacks, MD

## 2015-12-03 DIAGNOSIS — L03211 Cellulitis of face: Secondary | ICD-10-CM | POA: Diagnosis not present

## 2015-12-03 LAB — VANCOMYCIN, TROUGH: VANCOMYCIN TR: 14 ug/mL (ref 10.0–20.0)

## 2015-12-03 MED ORDER — MUPIROCIN CALCIUM 2 % EX CREA
TOPICAL_CREAM | Freq: Two times a day (BID) | CUTANEOUS | Status: DC
  Filled 2015-12-03: qty 15

## 2015-12-03 MED ORDER — SULFAMETHOXAZOLE-TRIMETHOPRIM 800-160 MG PO TABS: 2.0000 | ORAL_TABLET | Freq: Two times a day (BID) | ORAL | Status: DC

## 2015-12-03 MED ORDER — MUPIROCIN CALCIUM 2 % EX CREA: TOPICAL_CREAM | Freq: Two times a day (BID) | CUTANEOUS | Status: DC

## 2015-12-03 NOTE — Consult Note (Signed)
Reason for Consult: Facial cellulitis Referring Physician: Dr. Efrain Sella Buddy Duty is an 35 y.o. female.  HPI: Patient is a 35 year old white female who presented to the emergency room with worsening facial and neck swelling with erythema. She had tried to express pus from a pimple on her chin. Over the next few days, redness and swelling increased. A CT scan done in the emergency room revealed soft tissue edema and cellulitis, but no abscess cavity. No airway compromise was noted. She was admitted to the hospital for further evaluation treatment. While she's been the hospital, the cellulitis on the chin matured and she started draining purulent fluid. She has no shortness of breath or difficulty swallowing. She states she feels much better and would like to be discharged.  Past Medical History  Diagnosis Date  . Hypertension   . Headache   . Stroke [facility])     Past Surgical History  Procedure Laterality Date  . Cesarean section    . Tubal ligation      Family History  Problem Relation Age of Onset  . Stroke Maternal Grandmother   . Heart attack Brother   . Heart attack Maternal Grandmother   . Cervical cancer Mother   . Breast cancer Sister   . Breast cancer Sister   . Breast cancer Sister     Social History:  reports that she has never smoked. She has never used smokeless tobacco. She reports that she does not drink alcohol or use illicit drugs.  Allergies:  Allergies  Allergen Reactions  . Celexa [Citalopram Hydrobromide] Anxiety    Medications:  Scheduled: . hydrochlorothiazide  25 mg Oral Daily  . lisinopril  10 mg Oral Daily  . sertraline  100 mg Oral Daily  . sertraline  50 mg Oral Daily  . sodium chloride flush  3 mL Intravenous Q12H  . topiramate  50 mg Oral BID  . vancomycin  1,000 mg Intravenous Q8H    Results for orders placed or performed during the hospital encounter of 12/01/15 (from the past 48 hour(s))  Basic metabolic panel     Status:  Abnormal   Collection Time: 12/02/15  6:13 AM  Result Value Ref Range   Sodium 137 135 - 145 mmol/L   Potassium 3.5 3.5 - 5.1 mmol/L   Chloride 109 101 - 111 mmol/L   CO2 24 22 - 32 mmol/L   Glucose, Bld 119 (H) 65 - 99 mg/dL   BUN 10 6 - 20 mg/dL   Creatinine, Ser 0.54 0.44 - 1.00 mg/dL   Calcium 7.6 (L) 8.9 - 10.3 mg/dL   GFR calc non Af Amer >60 >60 mL/min   GFR calc Af Amer >60 >60 mL/min    Comment: (NOTE) The eGFR has been calculated using the CKD EPI equation. This calculation has not been validated in all clinical situations. eGFR's persistently <60 mL/min signify possible Chronic Kidney Disease.    Anion gap 4 (L) 5 - 15  Vancomycin, trough     Status: None   Collection Time: 12/03/15  6:20 AM  Result Value Ref Range   Vancomycin Tr 14 10.0 - 20.0 ug/mL    No results found.  ROS:  A comprehensive review of systems was negative.  Blood pressure 134/69, pulse 60, temperature 97.7 F (36.5 C), temperature source Oral, resp. rate 18, height 5' (1.524 m), weight 119.115 kg (262 lb 9.6 oz), last menstrual period 11/30/2015, SpO2 95 %. Physical Exam: Pleasant white female in no acute distress.  HEENT examination reveals minimal swelling in the neck with some erythema around a draining abscess on the chin. Mild induration present in this region. Neck is supple without significant swelling or erythema. Lungs clear to auscultation with equal breath sounds bilaterally. Heart examination reveals a regular rate and rhythm without S3, S4, murmurs.  Assessment/Plan: Impression: Resolving cellulitis with small abscess that is draining, face Plan: No surgical intervention needed at this time. Patient may be discharged home on Bactrim. She should also use Bactroban cream 2-3 times a day on the wound. She may follow-up in my office if any problems arise.  Joesphine Schemm A 12/03/2015, 8:26 AM

## 2015-12-03 NOTE — Progress Notes (Signed)
Pharmacy Antibiotic Note  Elmer SowStacy M Underwood is a [age] y.o. female admitted on 12/01/2015 with cellulitis.  Pharmacy has been consulted for Vancomycin dosing. Vancomycin trough = 3514mcg/ml, therapeutic. Continue current regimen. Plan: Vancomycin 2gm loading dose then 1gm  IV every 8 hours.  Goal trough 10-15 mcg/mL.  F/u plan and vital signs  Height: 5' (152.4 cm) Weight: 262 lb 9.6 oz (119.115 kg) IBW/kg (Calculated) : 45.5  Temp (24hrs), Avg:98.1 F (36.7 C), Min:97.7 F (36.5 C), Max:98.6 F (37 C)   Recent Labs Lab 12/01/15 0551 12/01/15 0624 12/02/15 0613 12/03/15 0620  WBC 11.5*  --   --   --   CREATININE 0.77 0.70 0.54  --   VANCOTROUGH  --   --   --  14    Estimated Creatinine Clearance: 115 mL/min (by C-G formula based on Cr of 0.54).    Allergies  Allergen Reactions  . Celexa [Citalopram Hydrobromide] Anxiety    Antimicrobials this admission: Clindamycin 5/12>> x 1 dose Vancomycin 5/12 >>   Dose adjustments this admission: N/A  Microbiology results: No cultures Thank you for allowing pharmacy to be a part of this patient's care.  Elder CyphersLorie Jarae Nemmers, BS Pharm D, BCPS Clinical Pharmacist Pager [phone] 12/03/2015 9:15 AM

## 2015-12-03 NOTE — Discharge Instructions (Signed)
Abscess °An abscess (boil or furuncle) is an infected area on or under the skin. This area is filled with yellowish-white fluid (pus) and other material (debris). °HOME CARE  °· Only take medicines as told by your doctor. °· If you were given antibiotic medicine, take it as directed. Finish the medicine even if you start to feel better. °· If gauze is used, follow your doctor's directions for changing the gauze. °· To avoid spreading the infection: °¨ Keep your abscess covered with a bandage. °¨ Wash your hands well. °¨ Do not share personal care items, towels, or whirlpools with others. °¨ Avoid skin contact with others. °· Keep your skin and clothes clean around the abscess. °· Keep all doctor visits as told. °GET HELP RIGHT AWAY IF:  °· You have more pain, puffiness (swelling), or redness in the wound site. °· You have more fluid or blood coming from the wound site. °· You have muscle aches, chills, or you feel sick. °· You have a fever. °MAKE SURE YOU:  °· Understand these instructions. °· Will watch your condition. °· Will get help right away if you are not doing well or get worse. °  °This information is not intended to replace advice given to you by your health care provider. Make sure you discuss any questions you have with your health care provider. °  °Document Released: 12/25/2007 Document Revised: 01/07/2012 Document Reviewed: 09/21/2011 °Elsevier Interactive Patient Education ©2016 [employer] Inc[employer] ° °

## 2015-12-03 NOTE — Progress Notes (Signed)
  PROGRESS NOTE  Elmer SowStacy M Underwood VHQ:[id number] DOB: November 03, 1980 DOA: 12/01/2015 PCP: Pershing ProudJACKSON,SAMANTHA, PA-C  Brief Narrative: 35 year old woman presented with facial swelling and pain. CT subjects that cellulitis without abscess. There is no evidence of complicating features. General surgery evaluated and does not believe any surgical intervention is necessary.  Assessment/Plan: 1. Cellulitis of neck and chin, overall much improved. Appreciate general surgery's opinion, plan home today.   Much improved. Discharge home with Bactrim through 5/18 and Bactroban.  Brendia Sacksaniel Craven Crean, MD  [facility] Direct contact:  --Via amion app OR  --[email or web address]; password [email or web address] and click  7PM-7AM contact night coverage as above 12/03/2015, 7:38 AM    Consultants:  None   Procedures:  None  Antimicrobials:  Vancomycin 5/12>. 5/15  Bactrim 5/15 >> 5/18  HPI/Subjective: Feels improved today. Reports that abscess is draining well. Reports that neck is still slightly tender. No difficulty swallowing. Eating fine.  Objective: Filed Vitals:   12/02/15 0628 12/02/15 1552 12/02/15 2155 12/03/15 0612  BP: 118/73 139/76 130/72 134/69  Pulse: 61 80 78 60  Temp: 97.6 F (36.4 C) 98 F (36.7 C) 98.6 F (37 C) 97.7 F (36.5 C)  TempSrc: Oral  Oral Oral  Resp: 18 18 18 18   Height:      Weight:      SpO2: 96% 98% 97% 95%    Intake/Output Summary (Last 24 hours) at 12/03/15 0738 Last data filed at 12/02/15 1700  Gross per 24 hour  Intake    720 ml  Output      0 ml  Net    720 ml     Filed Weights   12/01/15 0543 12/01/15 0905  Weight: 116.121 kg (256 lb) 119.115 kg (262 lb 9.6 oz)    Exam:   Constitutional:  . Appears calm and comfortable ENMT:  . grossly normal hearing  . Floor of mouth looks normal, oropharynx clear. Left tonsil appears unchanged, somewhat enlarged. Respiratory:  . CTA bilaterally, no w/r/r.  . Respiratory effort normal. No retractions or accessory  muscle use Cardiovascular:  . RRR, no m/r/g Skin:  . Near resolution of erythema submentally. Right side of chin, less edema, near resolution of erythema. No fluctuance, some induration.  Psychiatric:  . judgement and insight appear normal . Mental status o Mood, affect appropriate o Orientation to person, place, time    I have personally reviewed following labs and imaging studies:    Scheduled Meds: . hydrochlorothiazide  25 mg Oral Daily  . lisinopril  10 mg Oral Daily  . sertraline  100 mg Oral Daily  . sertraline  50 mg Oral Daily  . sodium chloride flush  3 mL Intravenous Q12H  . topiramate  50 mg Oral BID  . vancomycin  1,000 mg Intravenous Q8H   Continuous Infusions:   Principal Problem:   Facial cellulitis     By signing my name below, I, Adron BeneGreylon Gawaluck, attest that this documentation has been prepared under the direction and in the presence of [place] Krist P. Irene LimboGoodrich, MD. Electronically Signed: Adron BeneGreylon Gawaluck, Scribe.  12/03/2015 9:35am  I personally performed the services described in this documentation. All medical record entries made by the scribe were at my direction. I have reviewed the chart and agree that the record reflects my personal performance and is accurate and complete. Brendia Sacksaniel Nicholis Stepanek, MD

## 2015-12-03 NOTE — Progress Notes (Signed)
Patient with orders to be discharge home. Discharge instructions given, patient verbalized. Patient left in private vehicle with family.

## 2015-12-03 NOTE — Discharge Summary (Signed)
Physician Discharge Summary  Nicole SowStacy M Travis VWU:[id number] DOB: 07/17/1981 DOA: 12/01/2015  PCP: Pershing ProudJACKSON,SAMANTHA, PA-C  Admit date: 12/01/2015 Discharge date: 12/03/2015  Recommendations for Outpatient Follow-up:  1. Follow-up resolution of cellulitis 2. Follow-up enlarged left tonsil post treatment of cellulitis  Follow-up Information    Follow up with Dalia HeadingJENKINS,MARK A, MD.   Specialty:  General Surgery   Why:  As needed   Contact information:   [address] [place] [id number] [phone]       Follow up with Terie PurserJACKSON,SAMANTHA, PA-C.   Specialty:  Family Medicine   Why:  As needed   Contact information:   [address] [place] [address] [phone]      Discharge Diagnoses:  1. Cellulitis of neck and chin  Discharge Condition: improved Disposition: home  Diet recommendation: regular  Filed Weights   12/01/15 0543 12/01/15 0905  Weight: 116.121 kg (256 lb) 119.115 kg (262 lb 9.6 oz)    History of present illness:  35 year old woman presented with left chin abscess and neck cellulitis.  Hospital Course:  Patient was found to have cellulitis of her neck and chin. She was admitted for observation and started on IV abx. It was thought that she was beginning to develop a drainable abscess on her chin, so general surgery was consulted. However, general surgery did not believe surgical intervention was necessary. She showed clinical improvement and all other problems remained stable. She will be discharged with a course of Bactrim to finish 5/18. Also Bactroban for topical treatment of chin wound.  Consults:  General Surgery  Discharge Instructions  Discharge Instructions    Activity as tolerated - No restrictions    Complete by:  As directed      Diet general    Complete by:  As directed      Discharge instructions    Complete by:  As directed   Call your physician or seek immediate medical attention for fever, increased redness,  swelling, pain or worsening of condition. Keep area clean and dry.          Discharge Medication List as of 12/03/2015 10:19 AM    START taking these medications   Details  mupirocin cream (BACTROBAN) 2 % Apply topically 2 (two) times daily., Starting 12/03/2015, Until Discontinued, Normal    sulfamethoxazole-trimethoprim (BACTRIM DS,SEPTRA DS) 800-160 MG tablet Take 2 tablets by mouth every 12 (twelve) hours., Starting 12/03/2015, Until Discontinued, Normal      CONTINUE these medications which have NOT CHANGED   Details  Biotin w/ Vitamins C & E (HAIR SKIN & NAILS GUMMIES PO) Take 2 tablets by mouth daily., Until Discontinued, Historical Med    gabapentin (NEURONTIN) 300 MG capsule Take 1 capsule (300 mg total) by mouth 3 (three) times daily., Starting 08/02/2015, Until Discontinued, Normal    hydrochlorothiazide (HYDRODIURIL) 25 MG tablet Take 25 mg by mouth daily., Until Discontinued, Historical Med    ibuprofen (ADVIL,MOTRIN) 200 MG tablet Take 400-800 mg by mouth every 6 (six) hours as needed for moderate pain., Until Discontinued, Historical Med    lisinopril (PRINIVIL,ZESTRIL) 10 MG tablet Take 10 mg by mouth daily., Until Discontinued, Historical Med    Multiple Vitamin (MULTIVITAMIN WITH MINERALS) TABS tablet Take 1 tablet by mouth daily., Until Discontinued, Historical Med    topiramate (TOPAMAX) 50 MG tablet Take 1 tablet (50 mg total) by mouth 2 (two) times daily., Starting 08/02/2015, Until Discontinued, Normal    traMADol (ULTRAM) 50 MG tablet Take 1 tablet (50 mg total)  by mouth every 6 (six) hours as needed., Starting 08/02/2015, Until Discontinued, Print    Cyanocobalamin (VITAMIN B-12 PO) Take 1 tablet by mouth daily. , Until Discontinued, Historical Med    !! sertraline (ZOLOFT) 100 MG tablet Take 1 tablet (100 mg total) by mouth daily., Starting 08/02/2015, Until Discontinued, Normal    !! sertraline (ZOLOFT) 50 MG tablet Take 1 tablet (50 mg total) by mouth daily.,  Starting 08/02/2015, Until Discontinued, Normal     !! - Potential duplicate medications found. Please discuss with provider.    STOP taking these medications     SUMAtriptan Succinate (ONZETRA XSAIL) 11 MG/NOSEPC EXHP        Allergies  Allergen Reactions  . Celexa [Citalopram Hydrobromide] Anxiety    The results of significant diagnostics from this hospitalization (including imaging, microbiology, ancillary and laboratory) are listed below for reference.    Significant Diagnostic Studies: Ct Soft Tissue Neck W Contrast  12/01/2015  CLINICAL DATA:  Abscess with cellulitis into the neck. Pain and redness in the chin and neck, progressively worsening after popping a pimple 4 days ago. 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 EXAM: CT NECK WITH CONTRAST TECHNIQUE: Multidetector CT imaging of the neck was performed using the standard protocol following the bolus administration of intravenous contrast. CONTRAST:  75mL ISOVUE-300 IOPAMIDOL (ISOVUE-300) INJECTION 61% COMPARISON:  Neck CTA 10/18/2014 FINDINGS: Pharynx and larynx: The there is mild-to-moderate asymmetric soft tissue fullness involving the left palatine tonsil. Some asymmetry was present on the prior CTA, however this is more prominent on the current examination. No discrete mass or the parapharyngeal inflammatory change is identified. The larynx is unremarkable. Mild-to-moderate inflammatory fat stranding is present in the submental region predominantly involving the subcutaneous tissues though also with anteroinferior submandibular space involvement as well. There is thickening of the platysma bilaterally. No significant sublingual space inflammatory changes seen, and no fluid collection is identified. Multiple dental caries are again seen with similar appearance of periapical lucency involving the left mandibular first molar extending through the buccal cortex. There is also slight periapical lucency involving the left mandibular third molar as well  as bilateral maxillary molars. Salivary glands: No intrinsic abnormality identified of the submandibular or parotid glands. Thyroid: Unremarkable. Lymph nodes: Small submental lymph nodes measure up to 6 mm in short axis. Submandibular lymph nodes measure up to 8 mm in short axis on the left. Level V IIa lymph nodes measure up to 8 mm bilaterally. These are all benign/ reactive in appearance. Vascular: Major vascular structures of the neck are grossly patent. Limited intracranial: Unremarkable. Visualized mastoids and paranasal sinuses: Clear. Skeleton: No acute osseous abnormality identified. Upper chest: Visualized lung apices are clear. IMPRESSION: 1. Moderate inflammatory change throughout the submental/submandibular region consistent with cellulitis. No abscess or floor of mouth involvement. 2. Left tonsillar asymmetry. Direct visualization recommended to exclude an underlying lesion. Electronically Signed   By: Sebastian Ache M.D.   On: 12/01/2015 07:25   Labs: Basic Metabolic Panel:  Recent Labs Lab 12/01/15 0551 12/01/15 0624 12/02/15 0613  NA 137 143 137  K 3.4* 3.9 3.5  CL 108 105 109  CO2 25  --  24  GLUCOSE 112* 106* 119*  BUN 13 11 10   CREATININE 0.77 0.70 0.54  CALCIUM 8.6*  --  7.6*   CBC:  Recent Labs Lab 12/01/15 0551 12/01/15 0624  WBC 11.5*  --   NEUTROABS 8.3*  --   HGB 13.2 14.3  HCT 40.2 42.0  MCV 92.0  --  PLT 275  --     Principal Problem:   Facial cellulitis   Time coordinating discharge: 35 minutes  Signed:  Brendia Sacks, MD [facility] 12/03/2015, 2:36 PM  By signing my name below, I, Adron Bene, attest that this documentation has been prepared under the direction and in the presence of Stirling Orton P. Irene Limbo, MD. Electronically Signed: Adron Bene, Scribe.  12/03/2015 9:35am  I personally performed the services described in this documentation. All medical record entries made by the scribe were at my direction. I have reviewed  the chart and agree that the record reflects my personal performance and is accurate and complete. Brendia Sacks, MD

## 2016-02-01 ENCOUNTER — Ambulatory Visit: Payer: Commercial Managed Care - PPO | Admitting: Neurology

## 2016-02-05 ENCOUNTER — Encounter: Payer: Self-pay | Admitting: Neurology

## 2016-02-19 ENCOUNTER — Ambulatory Visit: Payer: Commercial Managed Care - PPO | Admitting: Neurology

## 2016-06-10 ENCOUNTER — Emergency Department (HOSPITAL_COMMUNITY): Payer: Commercial Managed Care - PPO

## 2016-06-10 ENCOUNTER — Emergency Department (HOSPITAL_COMMUNITY)
Admission: EM | Admit: 2016-06-10 | Discharge: 2016-06-10 | Disposition: A | Discharge status: Home / Self Care | Payer: Commercial Managed Care - PPO | Attending: Emergency Medicine | Admitting: Emergency Medicine

## 2016-06-10 ENCOUNTER — Encounter (HOSPITAL_COMMUNITY): Payer: Self-pay | Admitting: Emergency Medicine

## 2016-06-10 DIAGNOSIS — R079 Chest pain, unspecified: Secondary | ICD-10-CM

## 2016-06-10 DIAGNOSIS — Z79899 Other long term (current) drug therapy: Secondary | ICD-10-CM | POA: Insufficient documentation

## 2016-06-10 DIAGNOSIS — M549 Dorsalgia, unspecified: Secondary | ICD-10-CM | POA: Insufficient documentation

## 2016-06-10 DIAGNOSIS — R6883 Chills (without fever): Secondary | ICD-10-CM | POA: Insufficient documentation

## 2016-06-10 DIAGNOSIS — I10 Essential (primary) hypertension: Secondary | ICD-10-CM | POA: Insufficient documentation

## 2016-06-10 DIAGNOSIS — R5383 Other fatigue: Secondary | ICD-10-CM | POA: Insufficient documentation

## 2016-06-10 DIAGNOSIS — R072 Precordial pain: Secondary | ICD-10-CM | POA: Insufficient documentation

## 2016-06-10 LAB — COMPREHENSIVE METABOLIC PANEL
ALT: 25 U/L (ref 14–54)
AST: 24 U/L (ref 15–41)
Albumin: 4.1 g/dL (ref 3.5–5.0)
Alkaline Phosphatase: 75 U/L (ref 38–126)
Anion gap: 8 (ref 5–15)
BUN: 9 mg/dL (ref 6–20)
CO2: 28 mmol/L (ref 22–32)
Calcium: 9 mg/dL (ref 8.9–10.3)
Chloride: 99 mmol/L — ABNORMAL LOW (ref 101–111)
Creatinine, Ser: 0.76 mg/dL (ref 0.44–1.00)
GFR calc Af Amer: >60 mL/min (ref >60)
GFR calc non Af Amer: >60 mL/min (ref >60)
Glucose, Bld: 112 mg/dL — ABNORMAL HIGH (ref 65–99)
Potassium: 3.5 mmol/L (ref 3.5–5.1)
Sodium: 135 mmol/L (ref 135–145)
Total Bilirubin: 0.7 mg/dL (ref 0.3–1.2)
Total Protein: 8.2 g/dL — ABNORMAL HIGH (ref 6.5–8.1)

## 2016-06-10 LAB — CBC
HCT: 43.3 % (ref 36.0–46.0)
Hemoglobin: 14.4 g/dL (ref 12.0–15.0)
MCH: 30.6 pg (ref 26.0–34.0)
MCHC: 33.3 g/dL (ref 30.0–36.0)
MCV: 91.9 fL (ref 78.0–100.0)
Platelets: 300 10*3/uL (ref 150–400)
RBC: 4.71 MIL/uL (ref 3.87–5.11)
RDW: 13.2 % (ref 11.5–15.5)
WBC: 11.6 10*3/uL — ABNORMAL HIGH (ref 4.0–10.5)

## 2016-06-10 LAB — TROPONIN I: Troponin I: <0.03 ng/mL (ref <0.03)

## 2016-06-10 MED ORDER — KETOROLAC TROMETHAMINE 30 MG/ML IJ SOLN
30.0000 mg | Freq: Once | INTRAMUSCULAR | Status: AC
  Administered 2016-06-10: 30 mg via INTRAMUSCULAR
  Filled 2016-06-10: qty 1

## 2016-06-10 MED ORDER — KETOROLAC TROMETHAMINE 30 MG/ML IJ SOLN: 30.0000 mg | Freq: Once | INTRAMUSCULAR | Status: DC

## 2016-06-10 NOTE — ED Triage Notes (Signed)
Drove herself here, sitting at work tightness in chest radiating into neck and around back, sob

## 2016-06-10 NOTE — ED Notes (Signed)
ED Provider at bedside. 

## 2016-06-10 NOTE — ED Provider Notes (Signed)
[facility]-EMERGENCY DEPT Provider Note   CSN: [id number] Arrival date & time: 06/10/16  1523  By signing my name below, I, Placido SouLogan Joldersma, attest that this documentation has been prepared under the direction and in the presence of Raeford RazorStephen Metta Koranda, MD. Electronically Signed: Placido SouLogan Joldersma, ED Scribe. 06/10/16. 7:40 PM.  History   Chief Complaint No chief complaint on file.  HPI HPI Comments: Elmer SowStacy M Underwood is a 35 y.o. female with a h/o HTN and stroke who presents to the Emergency Department complaining of worsening, waxing and waning, moderate, central chest tightness x 5 hours. She states she was driving and began experiencing her chest tightness which radiates up her neck, across to her back and down her left arm. She reports associated chills and fatigue. She denies a h/o similar symptoms. Her pain worsens with palpation and denies any alleviating factors. She denies any recent heavy lifting or strain. Pt reports chronic intermittent right sided numbness and right leg pain which has been present since a prior stroke. She has a FMHx of heart disease and MI and notes her brother died from an MI "in his late thirties or early forties". Pt denies a h/o DVT/PE. She denies leg swelling and acute leg pain.    The history is provided by the patient and medical records. No language interpreter was used.    Past Medical History:  Diagnosis Date  . Headache   . Hypertension   . Stroke [facility](HCC)     Patient Active Problem List   Diagnosis Date Noted  . Facial cellulitis 12/01/2015  . Migraine 08/02/2015  . 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 Hemiparesis (HCC) 11/05/2014  . 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 Convulsions/seizures (HCC) 10/31/2014    Past Surgical History:  Procedure Laterality Date  . CESAREAN SECTION    . TUBAL LIGATION      OB History    Gravida Para Term Preterm AB Living   2 2 2      2    SAB TAB Ectopic Multiple Live Births                   Home Medications    Prior to Admission medications   Medication Sig Start Date End Date Taking? Authorizing Provider  Cyanocobalamin (VITAMIN B-12 PO) Take 1 tablet by mouth daily.    Yes Historical Provider, MD  hydrochlorothiazide (HYDRODIURIL) 25 MG tablet Take 25 mg by mouth daily.   Yes Historical Provider, MD  ibuprofen (ADVIL,MOTRIN) 200 MG tablet Take 400-800 mg by mouth every 6 (six) hours as needed for moderate pain.   Yes Historical Provider, MD  lisinopril (PRINIVIL,ZESTRIL) 10 MG tablet Take 10 mg by mouth daily.   Yes Historical Provider, MD  Multiple Vitamin (MULTIVITAMIN WITH MINERALS) TABS tablet Take 1 tablet by mouth daily.   Yes Historical Provider, MD  sertraline (ZOLOFT) 100 MG tablet Take 1 tablet (100 mg total) by mouth daily. 08/02/15  Yes Anson FretAntonia B Ahern, MD  sertraline (ZOLOFT) 50 MG tablet Take 1 tablet (50 mg total) by mouth daily. 08/02/15  Yes Anson FretAntonia B Ahern, MD  topiramate (TOPAMAX) 50 MG tablet Take 1 tablet (50 mg total) by mouth 2 (two) times daily. 08/02/15  Yes Anson FretAntonia B Ahern, MD    Family History Family History  Problem Relation Age of Onset  . Cervical cancer Mother   . Stroke Maternal Grandmother   . Heart attack Maternal Grandmother   .  Heart attack Brother   . Breast cancer Sister   . Breast cancer Sister   . Breast cancer Sister     Social History Social History  Substance Use Topics  . Smoking status: Never Smoker  . Smokeless tobacco: Never Used  . Alcohol use No     Allergies   Celexa [citalopram hydrobromide]   Review of Systems Review of Systems  Constitutional: Positive for chills and fatigue.  Respiratory: Positive for chest tightness.   Cardiovascular: Negative for leg swelling.  Musculoskeletal: Positive for back pain and myalgias.  All other systems reviewed and are negative.  Physical Exam Updated Vital Signs BP (!) 155/107 (BP Location: Left Arm)    Pulse 104   Temp 98.6 F (37 C) (Oral)   Resp 20   Ht 5' (1.524 m)   Wt 252 lb (114.3 kg)   LMP 05/27/2016   SpO2 95%   BMI 49.22 kg/m   Physical Exam  Constitutional: She is oriented to person, place, and time. She appears well-developed and well-nourished. No distress.  HENT:  Head: Normocephalic and atraumatic.  Eyes: EOM are normal.  Neck: Normal range of motion.  Cardiovascular: Normal rate, regular rhythm and normal heart sounds.   Pulmonary/Chest: Effort normal and breath sounds normal. She exhibits tenderness (mild TTP of mid-upper sternum).  Abdominal: Soft. She exhibits no distension. There is no tenderness.  Musculoskeletal: Normal range of motion.  Neurological: She is alert and oriented to person, place, and time.  Skin: Skin is warm and dry.  Psychiatric: She has a normal mood and affect. Judgment normal.  Nursing note and vitals reviewed.  ED Treatments / Results  Labs (all labs ordered are listed, but only abnormal results are displayed) Labs Reviewed  COMPREHENSIVE METABOLIC PANEL - Abnormal; Notable for the following:       Result Value   Chloride 99 (*)    Glucose, Bld 112 (*)    Total Protein 8.2 (*)    All other components within normal limits  CBC - Abnormal; Notable for the following:    WBC 11.6 (*)    All other components within normal limits  TROPONIN I    EKG  EKG Interpretation  Date/Time:  Monday June 10 2016 15:27:13 EST Ventricular Rate:  111 PR Interval:  160 QRS Duration: 80 QT Interval:  324 QTC Calculation: 440 R Axis:   68 Text Interpretation:  Sinus tachycardia Otherwise normal ECG Confirmed by Juleen ChinaKOHUT  MD, Kimarie Coor [phone]([id number]) on 06/10/2016 7:17:38 PM       Radiology Dg Chest 2 View  Result Date: 06/10/2016 CLINICAL DATA:  Chest and back pain, shortness of breath. EXAM: CHEST  2 VIEW COMPARISON:  07/18/2014 FINDINGS: The heart size and mediastinal contours are within normal limits. Both lungs are clear. The visualized  skeletal structures are unremarkable. IMPRESSION: No active cardiopulmonary disease. Electronically Signed   By: Charlett NoseKevin  Dover M.D.   On: 06/10/2016 16:24    Procedures Procedures  DIAGNOSTIC STUDIES: Oxygen Saturation is 100% on RA, normal by my interpretation.    COORDINATION OF CARE: 7:40 PM Discussed next steps with pt. Pt verbalized understanding and is agreeable with the plan.    Medications Ordered in ED Medications - No data to display   Initial Impression / Assessment and Plan / ED Course  I have reviewed the triage vital signs and the nursing notes.  Pertinent labs & imaging results that were available during my care of the patient were reviewed by me and considered  in my medical decision making (see chart for details).  Clinical Course     35yF with CP. Doubt ACS, PE, dissection or other emergent process.   Final Clinical Impressions(s) / ED Diagnoses   Final diagnoses:  Chest pain, unspecified type    New Prescriptions New Prescriptions   No medications on file     Raeford Razor, MD 06/20/16 1435

## 2016-06-10 NOTE — ED Notes (Signed)
Patient given ice chips per patient request.  

## 2017-03-04 IMAGING — CT CT NECK W/ CM
3 of 4 series · 12 of 33 positions shown, 14 images · IV contrast (Omnipaque 300)
Comparison: Neck CTA 10/18/2014

CLINICAL DATA: Abscess with cellulitis into the neck. Pain and
redness in the chin and neck, progressively worsening after popping
a pimple 4 days ago. Concern for Ludwig's angina.

EXAM:
CT NECK WITH CONTRAST
TECHNIQUE: Multidetector CT imaging of the neck was performed using the
standard protocol following the bolus administration of intravenous
contrast.
CONTRAST:  75mL [id number] IOPAMIDOL (IIFS6X-3QQ) INJECTION 61%

[Series 4: neck 2.0 soft tissue sag · sagittal · 0.40mm/px · 5 of 164 slices shown, 6 images]
[im 55/164  bone]
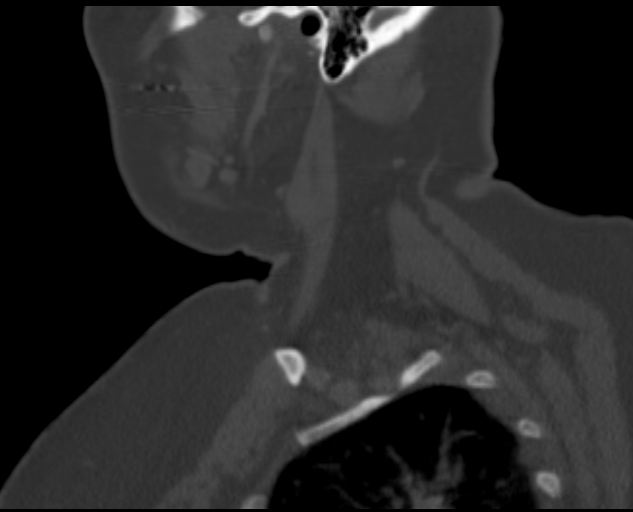
[im 68/164  bone]
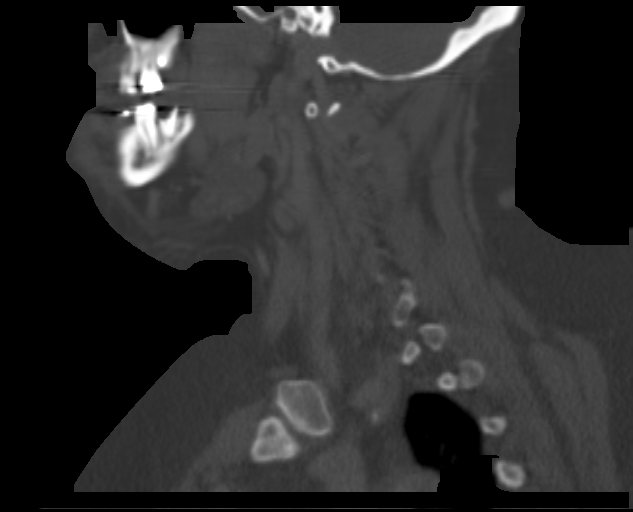
[im 82/164  soft-tissue]
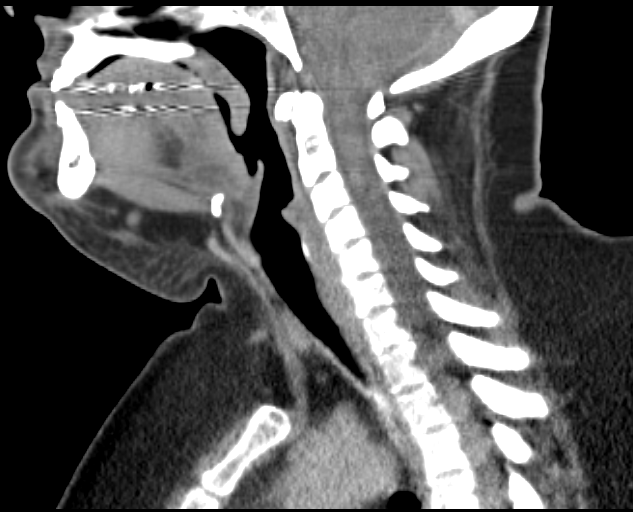
[im 82/164  bone]
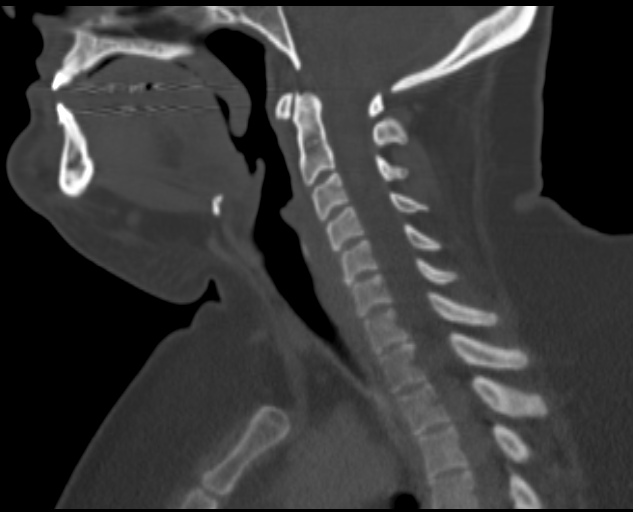
[im 96/164  bone]
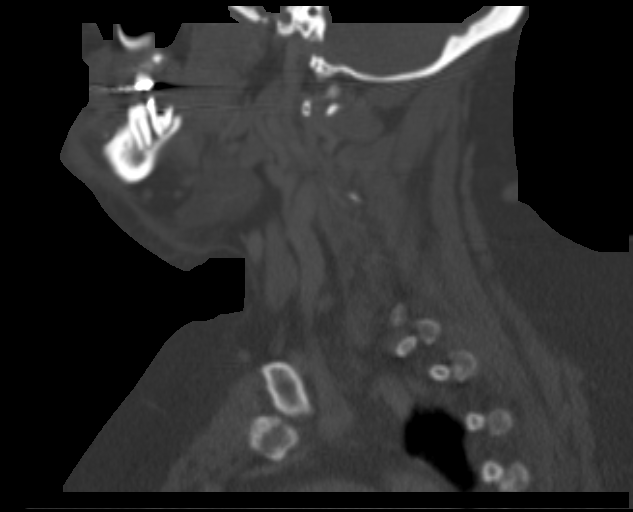
[im 109/164  bone]
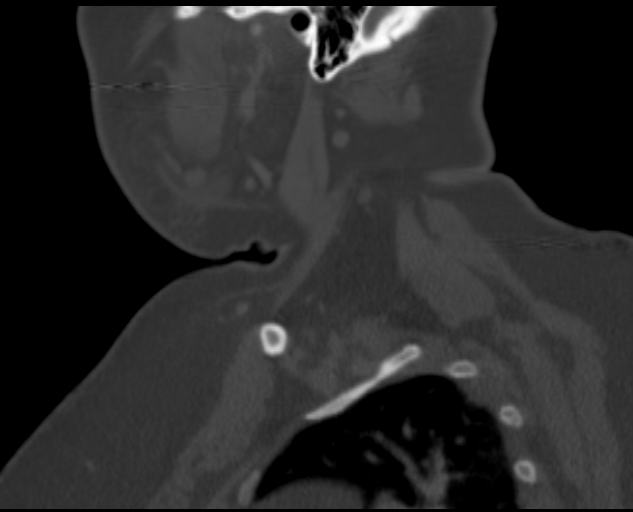

[Series 5: neck 2.0 soft tissue coro · coronal · 0.40mm/px · 3 of 126 slices shown]
[im 26/126  bone]
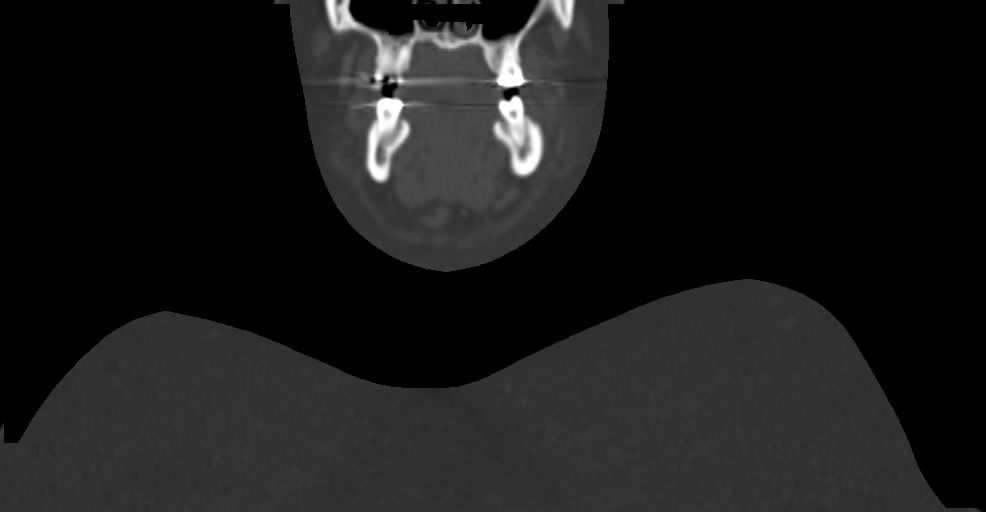
[im 51/126  bone]
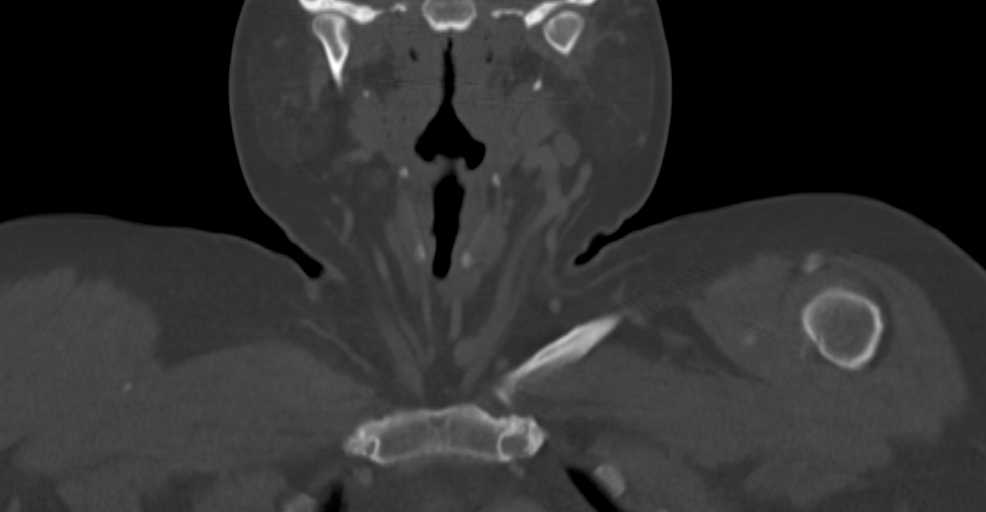
[im 76/126  bone]
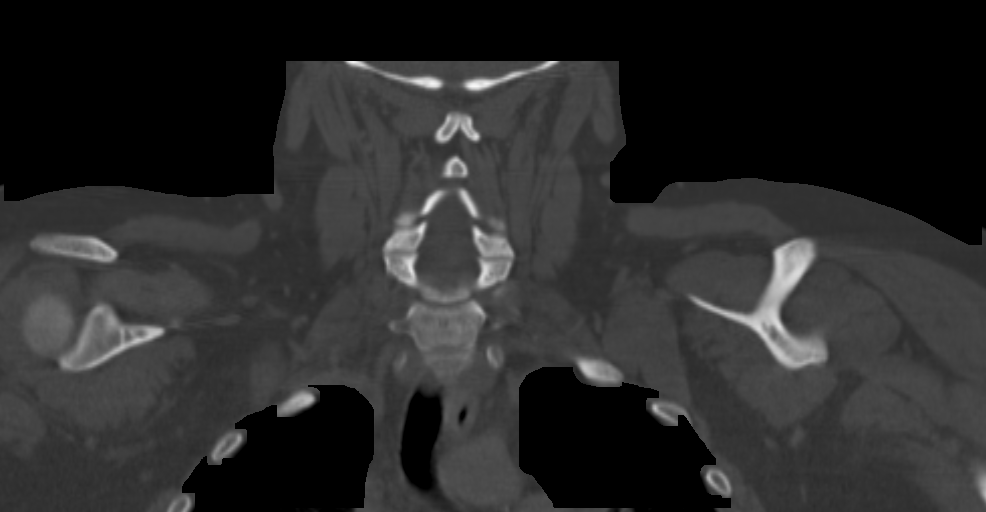

[Series 6: axial soft tissue neck 2.0 · axial · 0.40mm/px · z∈[-32,+113]mm · 4 of 125 slices shown, 5 images]
[im 18/125  soft-tissue]
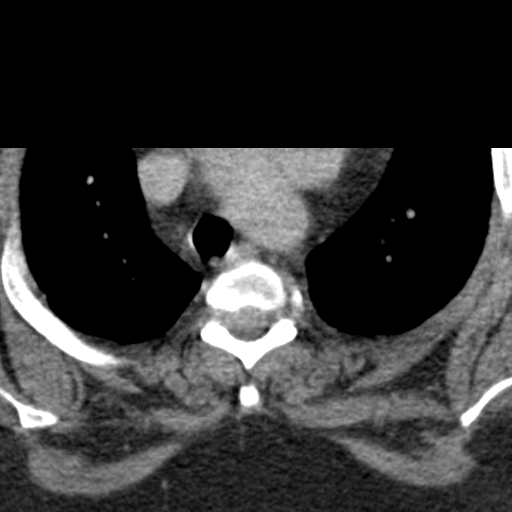
[im 18/125  bone]
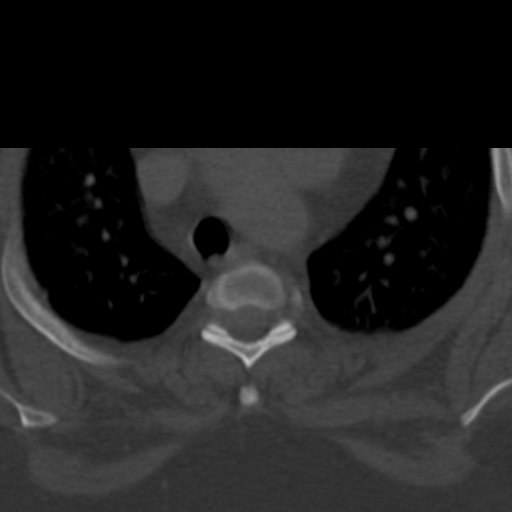
[im 54/125  bone]
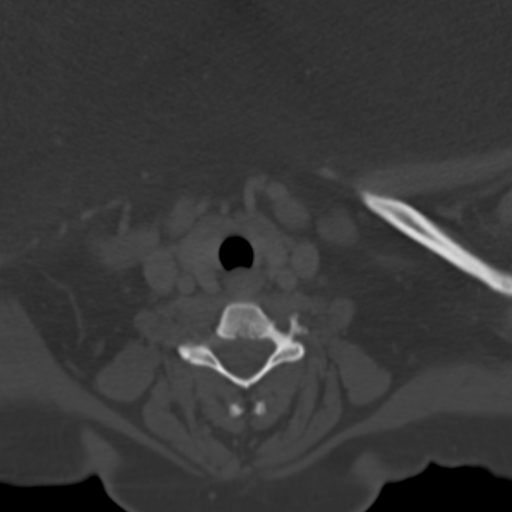
[im 71/125  bone]
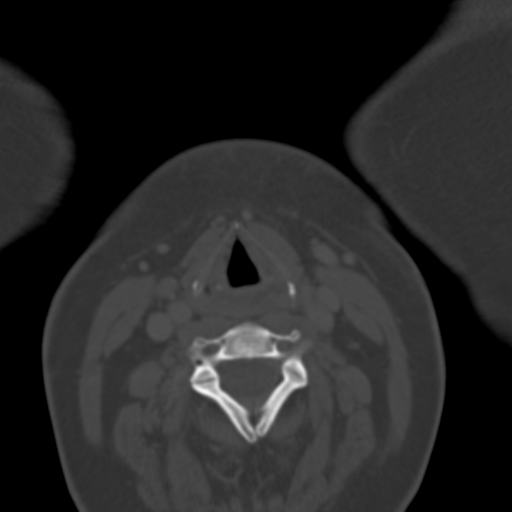
[im 107/125  bone]
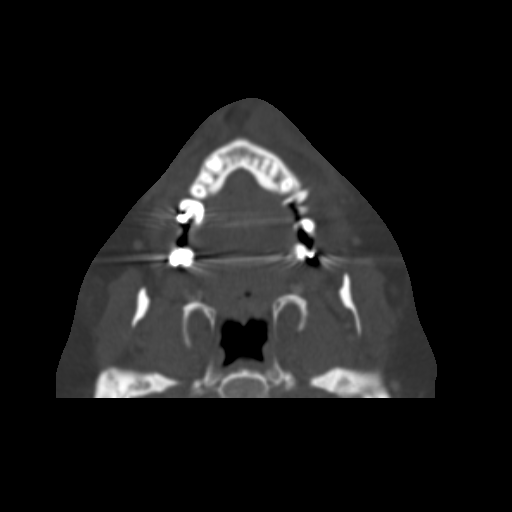

[12 of 33 positions shown; findings below may reference images not displayed]

FINDINGS: Pharynx and larynx: The there is mild-to-moderate asymmetric soft
tissue fullness involving the left palatine tonsil. Some asymmetry
was present on the prior CTA, however this is more prominent on the
current examination. No discrete mass or the parapharyngeal
inflammatory change is identified. The larynx is unremarkable.

Mild-to-moderate inflammatory fat stranding is present in the
submental region predominantly involving the subcutaneous tissues
though also with anteroinferior submandibular space involvement as
well. There is thickening of the platysma bilaterally. No
significant sublingual space inflammatory changes seen, and no fluid
collection is identified. Multiple dental caries are again seen with
similar appearance of periapical lucency involving the left
mandibular first molar extending through the buccal cortex. There is
also slight periapical lucency involving the left mandibular third
molar as well as bilateral maxillary molars.

Salivary glands: No intrinsic abnormality identified of the
submandibular or parotid glands.

Thyroid: Unremarkable.

Lymph nodes: Small submental lymph nodes measure up to 6 mm in short
axis. Submandibular lymph nodes measure up to 8 mm in short axis on
the left. Level V IIa lymph nodes measure up to 8 mm bilaterally.
These are all benign/ reactive in appearance.

Vascular: Major vascular structures of the neck are grossly patent.

Limited intracranial: Unremarkable.

Visualized mastoids and paranasal sinuses: Clear.

Skeleton: No acute osseous abnormality identified.

Upper chest: Visualized lung apices are clear.
IMPRESSION: 1. Moderate inflammatory change throughout the
submental/submandibular region consistent with cellulitis. No
abscess or floor of mouth involvement.
2. Left tonsillar asymmetry. Direct visualization recommended to
exclude an underlying lesion.

## 2017-03-12 ENCOUNTER — Emergency Department (HOSPITAL_COMMUNITY)
Admission: EM | Admit: 2017-03-12 | Discharge: 2017-03-12 | Disposition: A | Discharge status: Home / Self Care | Payer: Self-pay | Attending: Emergency Medicine | Admitting: Emergency Medicine

## 2017-03-12 ENCOUNTER — Encounter (HOSPITAL_COMMUNITY): Payer: Self-pay | Admitting: Emergency Medicine

## 2017-03-12 ENCOUNTER — Emergency Department (HOSPITAL_COMMUNITY): Payer: Self-pay

## 2017-03-12 DIAGNOSIS — I10 Essential (primary) hypertension: Secondary | ICD-10-CM | POA: Insufficient documentation

## 2017-03-12 DIAGNOSIS — J069 Acute upper respiratory infection, unspecified: Secondary | ICD-10-CM | POA: Insufficient documentation

## 2017-03-12 DIAGNOSIS — N939 Abnormal uterine and vaginal bleeding, unspecified: Secondary | ICD-10-CM

## 2017-03-12 DIAGNOSIS — N938 Other specified abnormal uterine and vaginal bleeding: Secondary | ICD-10-CM | POA: Insufficient documentation

## 2017-03-12 LAB — SAMPLE TO BLOOD BANK

## 2017-03-12 LAB — CBC WITH DIFFERENTIAL/PLATELET
Basophils Absolute: 0 10*3/uL (ref 0.0–0.1)
Basophils Relative: 0 %
EOS PCT: 4 %
Eosinophils Absolute: 0.3 10*3/uL (ref 0.0–0.7)
HCT: 41.9 % (ref 36.0–46.0)
Hemoglobin: 14.1 g/dL (ref 12.0–15.0)
Lymphocytes Relative: 22 %
Lymphs Abs: 1.7 10*3/uL (ref 0.7–4.0)
MCH: 30.4 pg (ref 26.0–34.0)
MCHC: 33.7 g/dL (ref 30.0–36.0)
MCV: 90.3 fL (ref 78.0–100.0)
Monocytes Absolute: 0.3 10*3/uL (ref 0.1–1.0)
Monocytes Relative: 4 %
Neutro Abs: 5.5 10*3/uL (ref 1.7–7.7)
Neutrophils Relative %: 70 %
PLATELETS: 297 10*3/uL (ref 150–400)
RBC: 4.64 MIL/uL (ref 3.87–5.11)
RDW: 13.2 % (ref 11.5–15.5)
WBC: 7.8 10*3/uL (ref 4.0–10.5)

## 2017-03-12 LAB — POC URINE PREG, ED: PREG TEST UR: NEGATIVE

## 2017-03-12 LAB — BASIC METABOLIC PANEL
Anion gap: 8 (ref 5–15)
BUN: 6 mg/dL (ref 6–20)
CHLORIDE: 100 mmol/L — AB (ref 101–111)
CO2: 28 mmol/L (ref 22–32)
CREATININE: 0.73 mg/dL (ref 0.44–1.00)
Calcium: 8.7 mg/dL — ABNORMAL LOW (ref 8.9–10.3)
GFR calc Af Amer: >60 mL/min (ref >60)
GLUCOSE: 111 mg/dL — AB (ref 65–99)
POTASSIUM: 3.6 mmol/L (ref 3.5–5.1)
Sodium: 136 mmol/L (ref 135–145)

## 2017-03-12 MED ORDER — DEXAMETHASONE 4 MG PO TABS: 4.0000 mg | ORAL_TABLET | Freq: Two times a day (BID) | ORAL | 0 refills | Status: DC

## 2017-03-12 MED ORDER — LORATADINE-PSEUDOEPHEDRINE ER 5-120 MG PO TB12: 1.0000 | ORAL_TABLET | Freq: Two times a day (BID) | ORAL | 0 refills | Status: DC

## 2017-03-12 NOTE — ED Provider Notes (Signed)
[facility]-EMERGENCY DEPT Provider Note   CSN: [id number] Arrival date & time: 03/12/17  0904     History   Chief Complaint Chief Complaint  Patient presents with  . Nasal Congestion    HPI Nicole Travis is a 36 y.o. female.  Patient is a 36 year old female who presents to the emergency department with a complaint of congestion and vaginal bleeding.  The patient states that over the past 2 or 3 days she has been having a cough and congestion. She's been having some greenish looking sputum present. At times she feels as though she has some shortness of breath. She has not measured a temperature elevation recently. She's not had any unusual rash. No vomiting or diarrhea reported.  The patient also states she would like to be evaluated for vaginal bleeding. She states that she has been on her menstrual cycle for nearly 3 months straight. She has not seen a gynecology specialist because she has no insurance at this time. The patient describes the flow as sometimes being very heavy, and another times being more spotting vaginal flow. She's not had any injury or trauma to the vaginal area. She is not on any birth control pills, shots, rings, or implants. She denies being unusually weak. She denies nausea vomiting. And she denies any loss of consciousness or altered mental status.      Past Medical History:  Diagnosis Date  . Headache   . Hypertension   . Stroke [facility])     Patient Active Problem List   Diagnosis Date Noted  . Facial cellulitis 12/01/2015  . Migraine 08/02/2015  . 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 Hemiparesis (HCC) 11/05/2014  . 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 Convulsions/seizures (HCC) 10/31/2014    Past Surgical History:  Procedure Laterality Date  . CESAREAN SECTION    . TUBAL LIGATION      OB History    Gravida Para Term Preterm AB Living   2 2 2     2    SAB TAB Ectopic  Multiple Live Births                   Home Medications    Prior to Admission medications   Medication Sig Start Date End Date Taking? Authorizing Provider  Cyanocobalamin (VITAMIN B-12 PO) Take 1 tablet by mouth daily.     [provider]  hydrochlorothiazide (HYDRODIURIL) 25 MG tablet Take 25 mg by mouth daily.    [provider]  ibuprofen (ADVIL,MOTRIN) 200 MG tablet Take 400-800 mg by mouth every 6 (six) hours as needed for moderate pain.    [provider]  lisinopril (PRINIVIL,ZESTRIL) 10 MG tablet Take 10 mg by mouth daily.    [provider]  Multiple Vitamin (MULTIVITAMIN WITH MINERALS) TABS tablet Take 1 tablet by mouth daily.    [provider]  sertraline (ZOLOFT) 100 MG tablet Take 1 tablet (100 mg total) by mouth daily. 08/02/15   Anson Fret, MD  sertraline (ZOLOFT) 50 MG tablet Take 1 tablet (50 mg total) by mouth daily. 08/02/15   Anson Fret, MD  topiramate (TOPAMAX) 50 MG tablet Take 1 tablet (50 mg total) by mouth 2 (two) times daily. 08/02/15   Anson Fret, MD    Family History Family History  Problem Relation Age of Onset  . Cervical cancer Mother   . Stroke Maternal Grandmother   . Heart attack Maternal Grandmother   .  Heart attack Brother   . Breast cancer Sister   . Breast cancer Sister   . Breast cancer Sister     Social History Social History  Substance Use Topics  . Smoking status: Never Smoker  . Smokeless tobacco: Never Used  . Alcohol use No     Allergies   Celexa [citalopram hydrobromide]   Review of Systems Review of Systems  Constitutional: Negative for activity change and fever.       All ROS Neg except as noted in HPI  HENT: Positive for congestion and postnasal drip. Negative for nosebleeds.   Eyes: Negative for photophobia and discharge.  Respiratory: Positive for cough. Negative for shortness of breath and wheezing.   Cardiovascular: Negative for chest pain and  palpitations.  Gastrointestinal: Negative for abdominal pain and blood in stool.  Genitourinary: Positive for vaginal bleeding. Negative for dysuria, frequency and hematuria.  Musculoskeletal: Negative for arthralgias, back pain and neck pain.  Skin: Negative.   Neurological: Negative for dizziness, seizures and speech difficulty.  Psychiatric/Behavioral: Negative for confusion and hallucinations.     Physical Exam Updated Vital Signs BP (!) 166/99 (BP Location: Left Arm)   Pulse 86   Temp 97.9 F (36.6 C) (Oral)   Resp 19   SpO2 95%   Physical Exam  Constitutional: She is oriented to person, place, and time. She appears well-developed and well-nourished.  Non-toxic appearance.  HENT:  Head: Normocephalic.  Right Ear: Tympanic membrane, external ear and ear canal normal.  Left Ear: Tympanic membrane, external ear and ear canal normal.  Nasal congestion present. Airway patent.   Eyes: Pupils are equal, round, and reactive to light. EOM and lids are normal.  Neck: Normal range of motion. Neck supple. Carotid bruit is not present.  Cardiovascular: Normal rate, regular rhythm, normal heart sounds, intact distal pulses and normal pulses.   Pulmonary/Chest: Breath sounds normal. No respiratory distress.  Abdominal: Soft. Bowel sounds are normal. There is no tenderness. There is no guarding.  No CVAT.  Musculoskeletal: Normal range of motion.  Lymphadenopathy:       Head (right side): No submandibular adenopathy present.       Head (left side): No submandibular adenopathy present.    She has no cervical adenopathy.  Neurological: She is alert and oriented to person, place, and time. She has normal strength. No cranial nerve deficit or sensory deficit.  Skin: Skin is warm and dry.  Psychiatric: She has a normal mood and affect. Her speech is normal.  Nursing note and vitals reviewed.    ED Treatments / Results  Labs (all labs ordered are listed, but only abnormal results are  displayed) Labs Reviewed  BASIC METABOLIC PANEL  CBC WITH DIFFERENTIAL/PLATELET  POC URINE PREG, ED  SAMPLE TO BLOOD BANK    EKG  EKG Interpretation None       Radiology No results found.  Procedures Procedures (including critical care time)  Medications Ordered in ED Medications - No data to display   Initial Impression / Assessment and Plan / ED Course  I have reviewed the triage vital signs and the nursing notes.  Pertinent labs & imaging results that were available during my care of the patient were reviewed by me and considered in my medical decision making (see chart for details).       Final Clinical Impressions(s) / ED Diagnoses MDM Blood pressure is elevated at 166/99, otherwise the vital signs are within normal limits. Urine pregnancy test is negative.  Basic  metabolic panel is within normal limits. Complete blood count was within normal limits. Chest x-ray is negative for acute problem.  Orthostatic blood pressure checks obtained area patient is not orthostatic. I ambulated the patient in the room, as well as the hall. No difficulty with walking and no alteration in status.  I've given the patient information to see the physicians at the [facility] outpatient clinic concerning her vaginal bleeding. I discussed with her the importance of her having this managed as quick as possible to avoid blood loss problems.  The patient will use Claritin-D and Decadron for her congestion problems. Patient is in agreement with this plan.    Final diagnoses:  Vaginal bleeding, abnormal  Upper respiratory tract infection, unspecified type    New Prescriptions New Prescriptions   No medications on file     Duayne Cal 03/12/17 1636    Donnetta Hutching, MD 03/13/17 [phone]

## 2017-03-12 NOTE — Discharge Instructions (Signed)
Please increase fluids. Use claritin D and decadron 2 times daily. Please see MD at the [facility] as soon as possible.

## 2017-03-12 NOTE — ED Triage Notes (Signed)
Pt c/o congestion/cough x 2 days with light green sputum. Pt was sob with walking to room but quickly resolved. Pt also states she has been on her cycle every day now for 3 months. Unable to see gyn or pcp due to no insurance. States is light some day and heavy some days. Denies fevers or pain. Pt also states not on her meds due to finances.

## 2017-04-28 ENCOUNTER — Ambulatory Visit (INDEPENDENT_AMBULATORY_CARE_PROVIDER_SITE_OTHER): Payer: Self-pay | Admitting: Obstetrics & Gynecology

## 2017-04-28 ENCOUNTER — Encounter: Payer: Self-pay | Admitting: Obstetrics & Gynecology

## 2017-04-28 VITALS — BP 172/97 | HR 82 | Wt 268.0 lb

## 2017-04-28 DIAGNOSIS — N938 Other specified abnormal uterine and vaginal bleeding: Secondary | ICD-10-CM

## 2017-04-28 DIAGNOSIS — Z113 Encounter for screening for infections with a predominantly sexual mode of transmission: Secondary | ICD-10-CM

## 2017-04-28 NOTE — Addendum Note (Signed)
Addended by: Gerome Apley on: 04/28/2017 08:32 AM   Modules accepted: Orders

## 2017-04-28 NOTE — Progress Notes (Signed)
Pt given info for [facility] [facility] as well as the free pap clinic.

## 2017-04-28 NOTE — Progress Notes (Signed)
Patient ID: Nicole Travis, female   DOB: September 24, 1980, 36 y.o.   MRN: [id number]  No chief complaint on file.   HPI Nicole Travis is a 36 y.o. female.   HPI She is here for a 3 month h/o continuous bleeding, basically since June. She had a HBG drawn in August that was normal. She wears overnight pads and sometimes bleeds through her clothes. No work up as of yet.   Past Medical History:  Diagnosis Date  . Headache   . Hypertension   . Stroke [facility])     Past Surgical History:  Procedure Laterality Date  . CESAREAN SECTION    . TUBAL LIGATION      Family History  Problem Relation Age of Onset  . Cervical cancer Mother   . Stroke Maternal Grandmother   . Heart attack Maternal Grandmother   . Heart attack Brother   . Breast cancer Sister   . Breast cancer Sister   . Breast cancer Sister     Social History Social History  Substance Use Topics  . Smoking status: Never Smoker  . Smokeless tobacco: Never Used  . 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 She had been prescribed BP meds but doesn't take them due to finances.  Allergies  Allergen Reactions  . Celexa [Citalopram Hydrobromide] Anxiety    Current Outpatient Prescriptions  Medication Sig Dispense Refill  . ibuprofen (ADVIL,MOTRIN) 200 MG tablet Take 400-800 mg by mouth every 6 (six) hours as needed for moderate pain.    . Cyanocobalamin (VITAMIN B-12 PO) Take 1 tablet by mouth daily.     Marland Kitchen dexamethasone (DECADRON) 4 MG tablet Take 1 tablet (4 mg total) by mouth 2 (two) times daily with a meal. (Patient not taking: Reported on 04/28/2017) 10 tablet 0  . hydrochlorothiazide (HYDRODIURIL) 25 MG tablet Take 25 mg by mouth daily.    Marland Kitchen lisinopril (PRINIVIL,ZESTRIL) 10 MG tablet Take 10 mg by mouth daily.    Marland Kitchen loratadine-pseudoephedrine (CLARITIN-D 12 HOUR) 5-120 MG tablet Take 1 tablet by mouth 2 (two) times daily.  (Patient not taking: Reported on 04/28/2017) 20 tablet 0  . Multiple Vitamin (MULTIVITAMIN WITH MINERALS) TABS tablet Take 1 tablet by mouth daily.    . sertraline (ZOLOFT) 100 MG tablet Take 1 tablet (100 mg total) by mouth daily. (Patient not taking: Reported on 03/12/2017) 30 tablet 11  . sertraline (ZOLOFT) 50 MG tablet Take 1 tablet (50 mg total) by mouth daily. (Patient not taking: Reported on 03/12/2017) 30 tablet 11  . topiramate (TOPAMAX) 50 MG tablet Take 1 tablet (50 mg total) by mouth 2 (two) times daily. (Patient not taking: Reported on 03/12/2017) 60 tablet 11   No current facility-administered medications for this visit.     Review of Systems Review of Systems  She reports normal bowel and bladder function. She reports a normal pap smear in 2015 in [place]  Blood pressure (!) 172/97, pulse 82, weight 268 lb (121.6 kg), last menstrual period 12/27/2016.  Physical Exam Physical Exam Heart Data Reviewed HBG  Assessment    Preventative care  HTN  DUB    Plan    She will be given the phone # for the free pap clinic She declines a flu vaccine  She is aware that another stroke is likely if she doesn't take her BP meds  Check gyn u/s,  TSH, cervical cultures, CBC       Clovis Warwick C Twanda Stakes 04/28/2017, 8:09 AM

## 2017-04-29 LAB — GC/CHLAMYDIA PROBE AMP (~~LOC~~) NOT AT ARMC
CHLAMYDIA, DNA PROBE: NEGATIVE
NEISSERIA GONORRHEA: NEGATIVE

## 2017-04-29 LAB — CBC
Hematocrit: 39.4 % (ref 34.0–46.6)
Hemoglobin: 12.6 g/dL (ref 11.1–15.9)
MCH: 29 pg (ref 26.6–33.0)
MCHC: 32 g/dL (ref 31.5–35.7)
MCV: 91 fL (ref 79–97)
PLATELETS: 334 10*3/uL (ref 150–379)
RBC: 4.34 x10E6/uL (ref 3.77–5.28)
RDW: 14.5 % (ref 12.3–15.4)
WBC: 9.3 10*3/uL (ref 3.4–10.8)

## 2017-04-29 LAB — TSH: TSH: 4.11 u[IU]/mL (ref 0.450–4.500)

## 2017-05-06 ENCOUNTER — Ambulatory Visit (HOSPITAL_COMMUNITY): Payer: Self-pay

## 2017-09-22 ENCOUNTER — Emergency Department (HOSPITAL_COMMUNITY)
Admission: EM | Admit: 2017-09-22 | Discharge: 2017-09-22 | Disposition: A | Discharge status: Home / Self Care | Payer: BLUE CROSS/BLUE SHIELD | Attending: Emergency Medicine | Admitting: Emergency Medicine

## 2017-09-22 ENCOUNTER — Encounter (HOSPITAL_COMMUNITY): Payer: Self-pay | Admitting: Emergency Medicine

## 2017-09-22 ENCOUNTER — Other Ambulatory Visit: Payer: Self-pay

## 2017-09-22 DIAGNOSIS — Z8673 Personal history of transient ischemic attack (TIA), and cerebral infarction without residual deficits: Secondary | ICD-10-CM | POA: Insufficient documentation

## 2017-09-22 DIAGNOSIS — L02211 Cutaneous abscess of abdominal wall: Secondary | ICD-10-CM | POA: Diagnosis not present

## 2017-09-22 DIAGNOSIS — I10 Essential (primary) hypertension: Secondary | ICD-10-CM | POA: Diagnosis not present

## 2017-09-22 DIAGNOSIS — L0291 Cutaneous abscess, unspecified: Secondary | ICD-10-CM

## 2017-09-22 LAB — COMPREHENSIVE METABOLIC PANEL
ALT: 16 U/L (ref 14–54)
ANION GAP: 9 (ref 5–15)
AST: 18 U/L (ref 15–41)
Albumin: 3.2 g/dL — ABNORMAL LOW (ref 3.5–5.0)
Alkaline Phosphatase: 77 U/L (ref 38–126)
BILIRUBIN TOTAL: 0.3 mg/dL (ref 0.3–1.2)
BUN: 9 mg/dL (ref 6–20)
CHLORIDE: 102 mmol/L (ref 101–111)
CO2: 27 mmol/L (ref 22–32)
Calcium: 8.5 mg/dL — ABNORMAL LOW (ref 8.9–10.3)
Creatinine, Ser: 0.67 mg/dL (ref 0.44–1.00)
GFR calc Af Amer: >60 mL/min (ref >60)
Glucose, Bld: 107 mg/dL — ABNORMAL HIGH (ref 65–99)
POTASSIUM: 3.8 mmol/L (ref 3.5–5.1)
Sodium: 138 mmol/L (ref 135–145)
TOTAL PROTEIN: 6.6 g/dL (ref 6.5–8.1)

## 2017-09-22 LAB — CBC WITH DIFFERENTIAL/PLATELET
BASOS ABS: 0 10*3/uL (ref 0.0–0.1)
BASOS PCT: 1 %
Eosinophils Absolute: 0.2 10*3/uL (ref 0.0–0.7)
Eosinophils Relative: 3 %
HEMATOCRIT: 36.1 % (ref 36.0–46.0)
Hemoglobin: 10.9 g/dL — ABNORMAL LOW (ref 12.0–15.0)
LYMPHS PCT: 27 %
Lymphs Abs: 2.2 10*3/uL (ref 0.7–4.0)
MCH: 24.3 pg — ABNORMAL LOW (ref 26.0–34.0)
MCHC: 30.2 g/dL (ref 30.0–36.0)
MCV: 80.4 fL (ref 78.0–100.0)
MONO ABS: 0.5 10*3/uL (ref 0.1–1.0)
MONOS PCT: 7 %
NEUTROS ABS: 5.1 10*3/uL (ref 1.7–7.7)
Neutrophils Relative %: 62 %
PLATELETS: 293 10*3/uL (ref 150–400)
RBC: 4.49 MIL/uL (ref 3.87–5.11)
RDW: 16.9 % — AB (ref 11.5–15.5)
WBC: 8.1 10*3/uL (ref 4.0–10.5)

## 2017-09-22 MED ORDER — CEPHALEXIN 500 MG PO CAPS
500.0000 mg | ORAL_CAPSULE | Freq: Once | ORAL | Status: AC
  Administered 2017-09-22: 500 mg via ORAL
  Filled 2017-09-22: qty 1

## 2017-09-22 MED ORDER — DOXYCYCLINE HYCLATE 100 MG PO CAPS: 100.0000 mg | ORAL_CAPSULE | Freq: Two times a day (BID) | ORAL | 0 refills | Status: DC

## 2017-09-22 MED ORDER — HYDROCODONE-ACETAMINOPHEN 5-325 MG PO TABS: 2.0000 | ORAL_TABLET | ORAL | 0 refills | Status: DC | PRN

## 2017-09-22 MED ORDER — DOXYCYCLINE HYCLATE 100 MG PO TABS
100.0000 mg | ORAL_TABLET | Freq: Once | ORAL | Status: AC
  Administered 2017-09-22: 100 mg via ORAL
  Filled 2017-09-22: qty 1

## 2017-09-22 MED ORDER — SODIUM CHLORIDE 0.9 % IV SOLN
1.0000 g | Freq: Once | INTRAVENOUS | Status: AC
  Administered 2017-09-22: 1 g via INTRAVENOUS
  Filled 2017-09-22: qty 10

## 2017-09-22 MED ORDER — CEPHALEXIN 500 MG PO CAPS: 500.0000 mg | ORAL_CAPSULE | Freq: Four times a day (QID) | ORAL | 0 refills | Status: DC

## 2017-09-22 MED ORDER — SODIUM CHLORIDE 0.9 % IV BOLUS (SEPSIS)
1000.0000 mL | Freq: Once | INTRAVENOUS | Status: AC
  Administered 2017-09-22: 1000 mL via INTRAVENOUS

## 2017-09-22 NOTE — Discharge Instructions (Addendum)
Return here for recheck tomorrow.  Soak area 20 minutes every hour

## 2017-09-22 NOTE — ED Triage Notes (Signed)
Pt reports "Abscess" noted to RLQ of abdomen since Thursday. Pt reports history of same. Pt denies v/d/f. Odor noted in triage to site. Pt reports drainage.

## 2017-09-22 NOTE — ED Notes (Signed)
Advised patient not to drive while taking prescription pain medication. Also advised patient to return tomorrow for recheck as requested by physician. Patient verbalized understanding.

## 2017-09-22 NOTE — ED Provider Notes (Signed)
[facility] EMERGENCY DEPARTMENT Provider Note   CSN: [id number] Arrival date & time: 09/22/17  [id number]     History   Chief Complaint Chief Complaint  Patient presents with  . Abscess    HPI Nicole FretStacy Travis is a [age] y.o. female.  The history is provided by the patient. No language interpreter was used.  Abscess  Location:  Torso Torso abscess location:  Abd RLQ Size:  12 cm  Abscess quality: draining and warmth   Red streaking: no   Duration:  2 days Progression:  Worsening Chronicity:  New Context: not diabetes   Relieved by:  Nothing Worsened by:  Nothing Ineffective treatments:  None tried Associated symptoms: no nausea     Past Medical History:  Diagnosis Date  . Headache   . Hypertension   . Stroke [facility](HCC)     Patient Active Problem List   Diagnosis Date Noted  . Facial cellulitis 12/01/2015  . Migraine 08/02/2015  . 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 Hemiparesis (HCC) 11/05/2014  . 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 Convulsions/seizures (HCC) 10/31/2014    Past Surgical History:  Procedure Laterality Date  . CESAREAN SECTION    . TUBAL LIGATION      OB History    Gravida Para Term Preterm AB Living   2 2 2     2    SAB TAB Ectopic Multiple Live Births                   Home Medications    Prior to Admission medications   Medication Sig Start Date End Date Taking? Authorizing Provider  cephALEXin (KEFLEX) 500 MG capsule Take 1 capsule (500 mg total) by mouth 4 (four) times daily. 09/22/17   Elson AreasSofia, Alliah Boulanger K, PA-C  doxycycline (VIBRAMYCIN) 100 MG capsule Take 1 capsule (100 mg total) by mouth 2 (two) times daily. 09/22/17   Elson AreasSofia, Laloni Rowton K, PA-C  HYDROcodone-acetaminophen (NORCO/VICODIN) 5-325 MG tablet Take 2 tablets by mouth every 4 (four) hours as needed. 09/22/17   Elson AreasSofia, Krystalyn Kubota K, PA-C    Family History Family History  Problem Relation Age of Onset  . Cervical  cancer Mother   . Stroke Maternal Grandmother   . Heart attack Maternal Grandmother   . Heart attack Brother   . Breast cancer Sister   . Breast cancer Sister   . Breast cancer Sister     Social History Social History   Tobacco Use  . Smoking status: Never Smoker  . Smokeless tobacco: Never Used  Substance Use Topics  . Alcohol use: No  . Drug use: No     Allergies   Celexa [citalopram hydrobromide]   Review of Systems Review of Systems  Gastrointestinal: Negative for nausea.  All other systems reviewed and are negative.    Physical Exam Updated Vital Signs BP (!) 159/94 (BP Location: Left Arm)   Pulse 77   Temp 98.2 F (36.8 C) (Oral)   Resp 18   Ht 5' (1.524 m)   Wt 124.7 kg (275 lb)   LMP 07/22/2017   SpO2 99%   BMI 53.71 kg/m   Physical Exam  Constitutional: She appears well-developed and well-nourished. No distress.  HENT:  Head: Normocephalic and atraumatic.  Eyes: Conjunctivae are normal.  Neck: Neck supple.  Cardiovascular: Normal rate and regular rhythm.  No murmur heard. Pulmonary/Chest: Effort normal and breath sounds normal. No respiratory distress.  Abdominal: Soft. There is  no tenderness.  Red swollen area right lower abdomen  No fluctuance.  Musculoskeletal: She exhibits no edema.  Neurological: She is alert.  Skin: Skin is warm and dry.  Psychiatric: She has a normal mood and affect.  Nursing note and vitals reviewed.    ED Treatments / Results  Labs (all labs ordered are listed, but only abnormal results are displayed) Labs Reviewed  CBC WITH DIFFERENTIAL/PLATELET - Abnormal; Notable for the following components:      Result Value   Hemoglobin 10.9 (*)    MCH 24.3 (*)    RDW 16.9 (*)    All other components within normal limits  COMPREHENSIVE METABOLIC PANEL - Abnormal; Notable for the following components:   Glucose, Bld 107 (*)    Calcium 8.5 (*)    Albumin 3.2 (*)    All other components within normal limits     EKG  EKG Interpretation None       Radiology No results found.  Procedures Procedures (including critical care time)  Medications Ordered in ED Medications  sodium chloride 0.9 % bolus 1,000 mL (0 mLs Intravenous Stopped 09/22/17 0947)  cefTRIAXone (ROCEPHIN) 1 g in sodium chloride 0.9 % 100 mL IVPB (0 g Intravenous Stopped 09/22/17 0947)  doxycycline (VIBRA-TABS) tablet 100 mg (100 mg Oral Given 09/22/17 1107)  cephALEXin (KEFLEX) capsule 500 mg (500 mg Oral Given 09/22/17 1107)     Initial Impression / Assessment and Plan / ED Course  I have reviewed the triage vital signs and the nursing notes.  Pertinent labs & imaging results that were available during my care of the patient were reviewed by me and considered in my medical decision making (see chart for details).       Final Clinical Impressions(s) / ED Diagnoses   Final diagnoses:  Abscess  Abscess of abdominal wall    ED Discharge Orders        Ordered    cephALEXin (KEFLEX) 500 MG capsule  4 times daily     09/22/17 1055    doxycycline (VIBRAMYCIN) 100 MG capsule  2 times daily     09/22/17 1055    HYDROcodone-acetaminophen (NORCO/VICODIN) 5-325 MG tablet  Every 4 hours PRN     09/22/17 1055    An After Visit Summary was printed and given to the patient.    Elson Areas, PA-C 09/22/17 1616    Samuel Jester, DO 09/25/17 2143

## 2017-09-23 ENCOUNTER — Emergency Department (HOSPITAL_COMMUNITY)
Admission: EM | Admit: 2017-09-23 | Discharge: 2017-09-23 | Disposition: A | Discharge status: Home / Self Care | Payer: BLUE CROSS/BLUE SHIELD | Attending: Emergency Medicine | Admitting: Emergency Medicine

## 2017-09-23 ENCOUNTER — Encounter (HOSPITAL_COMMUNITY): Payer: Self-pay | Admitting: Emergency Medicine

## 2017-09-23 ENCOUNTER — Other Ambulatory Visit: Payer: Self-pay

## 2017-09-23 DIAGNOSIS — L0291 Cutaneous abscess, unspecified: Secondary | ICD-10-CM | POA: Diagnosis present

## 2017-09-23 DIAGNOSIS — L03311 Cellulitis of abdominal wall: Secondary | ICD-10-CM | POA: Insufficient documentation

## 2017-09-23 DIAGNOSIS — I10 Essential (primary) hypertension: Secondary | ICD-10-CM

## 2017-09-23 DIAGNOSIS — Z760 Encounter for issue of repeat prescription: Secondary | ICD-10-CM

## 2017-09-23 MED ORDER — LIDOCAINE-EPINEPHRINE (PF) 1 %-1:200000 IJ SOLN
10.0000 mL | Freq: Once | INTRAMUSCULAR | Status: AC
  Administered 2017-09-23: 10 mL
  Filled 2017-09-23: qty 30

## 2017-09-23 MED ORDER — LISINOPRIL 10 MG PO TABS: 10.0000 mg | ORAL_TABLET | Freq: Every day | ORAL | 0 refills | Status: DC

## 2017-09-23 MED ORDER — OXYCODONE-ACETAMINOPHEN 5-325 MG PO TABS: 1.0000 | ORAL_TABLET | ORAL | 0 refills | Status: DC | PRN

## 2017-09-23 MED ORDER — POVIDONE-IODINE 10 % EX SOLN
CUTANEOUS | Status: DC | PRN
  Administered 2017-09-23: 10:00:00 via TOPICAL
  Filled 2017-09-23: qty 15

## 2017-09-23 NOTE — ED Triage Notes (Signed)
Pt was seen here yesterday for abscess.  Pt comes today for recheck.  States having drainage and increased pain.

## 2017-09-23 NOTE — ED Notes (Signed)
Suture cart placed at bedside. 

## 2017-09-23 NOTE — ED Provider Notes (Signed)
[facility] EMERGENCY DEPARTMENT Provider Note   CSN: [id number] Arrival date & time: 09/23/17  4098     History   Chief Complaint Chief Complaint  Patient presents with  . Wound Check    HPI Nicole Travis is a 37 y.o. female who was seen here yesterday for an abscess and cellulitis of her right lower abdomen presenting today for worse pain and now drainage from a small site that has developed overnight.  She has been applying warm compresses which she suspects is the reason for the drainage.  She was placed on Keflex and doxycycline yesterday and has taken her last dose of both medicines this morning.  She denies fevers or chills, nausea or vomiting.  Pain is triggered by movement and pressing on the site.  She has taken hydrocodone which was also prescribed yesterday but is offering minimal pain relief.  The history is provided by the patient.    Past Medical History:  Diagnosis Date  . Headache   . Hypertension   . Stroke [facility])     Patient Active Problem List   Diagnosis Date Noted  . Facial cellulitis 12/01/2015  . Migraine 08/02/2015  . 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 Hemiparesis (HCC) 11/05/2014  . 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 Convulsions/seizures (HCC) 10/31/2014    Past Surgical History:  Procedure Laterality Date  . CESAREAN SECTION    . TUBAL LIGATION      OB History    Gravida Para Term Preterm AB Living   2 2 2     2    SAB TAB Ectopic Multiple Live Births                   Home Medications    Prior to Admission medications   Medication Sig Start Date End Date Taking? Authorizing Provider  cephALEXin (KEFLEX) 500 MG capsule Take 1 capsule (500 mg total) by mouth 4 (four) times daily. 09/22/17   Elson Areas, PA-C  doxycycline (VIBRAMYCIN) 100 MG capsule Take 1 capsule (100 mg total) by mouth 2 (two) times daily. 09/22/17   Elson Areas, PA-C    HYDROcodone-acetaminophen (NORCO/VICODIN) 5-325 MG tablet Take 2 tablets by mouth every 4 (four) hours as needed. 09/22/17   Elson Areas, PA-C  lisinopril (PRINIVIL,ZESTRIL) 10 MG tablet Take 1 tablet (10 mg total) by mouth daily. 09/23/17   Burgess Amor, PA-C  oxyCODONE-acetaminophen (PERCOCET/ROXICET) 5-325 MG tablet Take 1 tablet by mouth every 4 (four) hours as needed. 09/23/17   Burgess Amor, PA-C    Family History Family History  Problem Relation Age of Onset  . Cervical cancer Mother   . Stroke Maternal Grandmother   . Heart attack Maternal Grandmother   . Heart attack Brother   . Breast cancer Sister   . Breast cancer Sister   . Breast cancer Sister     Social History Social History   Tobacco Use  . Smoking status: Never Smoker  . Smokeless tobacco: Never Used  Substance Use Topics  . Alcohol use: No  . Drug use: No     Allergies   Celexa [citalopram hydrobromide]   Review of Systems Review of Systems  Constitutional: Negative for chills and fever.  HENT: Negative for ear pain and sore throat.   Eyes: Negative for pain and visual disturbance.  Respiratory: Negative for cough and shortness of breath.   Cardiovascular: Negative for chest pain.  Gastrointestinal:  Negative for nausea and vomiting.  Genitourinary: Negative for dysuria and hematuria.  Musculoskeletal: Negative for arthralgias and back pain.  Skin: Positive for color change and wound. Negative for rash.  Neurological: Negative for seizures and syncope.  All other systems reviewed and are negative.    Physical Exam Updated Vital Signs BP (!) 169/65 (BP Location: Right Arm)   Pulse 77   Temp 98 F (36.7 C) (Oral)   Resp 19   Ht 5' (1.524 m)   Wt 124.7 kg (275 lb)   SpO2 94%   BMI 53.71 kg/m   Physical Exam  Constitutional: She appears well-developed and well-nourished. No distress.  HENT:  Head: Normocephalic.  Neck: Neck supple.  Cardiovascular: Normal rate.  Pulmonary/Chest: Effort  normal. She has no wheezes.  Musculoskeletal: Normal range of motion. She exhibits no edema.  Skin: There is erythema.  14 x 6cm area or light erythema right lower abdomen. Central 1 cm purple coloration fluctuance with small amount of spontaneous purulence.      ED Treatments / Results  Labs (all labs ordered are listed, but only abnormal results are displayed) Labs Reviewed - No data to display  EKG  EKG Interpretation None       Radiology No results found.  Procedures Procedures (including critical care time)  INCISION AND DRAINAGE Performed by: Burgess AmorIDOL, Yui Mulvaney Consent: Verbal consent obtained. Risks and benefits: risks, benefits and alternatives were discussed Type: abscess  Body area: right lower abdomen  Anesthesia: local infiltration  Incision was made with a scalpel.  Local anesthetic: lidocaine 1% with epinephrine  Anesthetic total: 4 ml  Complexity: complex Blunt dissection to break up loculations  Drainage: purulent  Drainage amount: moderate  Packing material: none Patient tolerance: Patient tolerated the procedure well with no immediate complications.     Medications Ordered in ED Medications  povidone-iodine (BETADINE) 10 % external solution (not administered)  lidocaine-EPINEPHrine (XYLOCAINE-EPINEPHrine) 1 %-1:200000 (PF) injection 10 mL (not administered)     Initial Impression / Assessment and Plan / ED Course  I have reviewed the triage vital signs and the nursing notes.  Pertinent labs & imaging results that were available during my care of the patient were reviewed by me and considered in my medical decision making (see chart for details).     Discussed continued tx vs I&D prior to dc home, pt chose to have the site opened.  She will continue her home abx, warm compresses.  Advised to double her hydrocodone as 1 tab was not effective.  Will prescribe a few oxycodone, since she will run out of her hydrocodone today with double  dosing. Pt was fairly hypertensive here, stating she just got her med insurance prior was out of her lisinopril 10 mg for about a year. Creatinine from labs ytd normal.  Will prescribe her lisinopril.  She plans to establish with Dr. Margo AyeHall.  Final Clinical Impressions(s) / ED Diagnoses   Final diagnoses:  Abscess  Cellulitis of abdominal wall  Essential hypertension  Medication refill    ED Discharge Orders        Ordered    lisinopril (PRINIVIL,ZESTRIL) 10 MG tablet  Daily     09/23/17 0954    oxyCODONE-acetaminophen (PERCOCET/ROXICET) 5-325 MG tablet  Every 4 hours PRN     09/23/17 0954       Burgess Amordol, Kynsleigh Westendorf, PA-C 09/23/17 0954    Samuel JesterMcManus, Kathleen, DO 09/25/17 2205

## 2018-04-26 ENCOUNTER — Emergency Department (HOSPITAL_COMMUNITY): Payer: Self-pay

## 2018-04-26 ENCOUNTER — Emergency Department (HOSPITAL_COMMUNITY)
Admission: EM | Admit: 2018-04-26 | Discharge: 2018-04-26 | Disposition: A | Discharge status: Home / Self Care | Payer: Self-pay | Attending: Emergency Medicine | Admitting: Emergency Medicine

## 2018-04-26 ENCOUNTER — Encounter (HOSPITAL_COMMUNITY): Payer: Self-pay | Admitting: Emergency Medicine

## 2018-04-26 DIAGNOSIS — J020 Streptococcal pharyngitis: Secondary | ICD-10-CM | POA: Insufficient documentation

## 2018-04-26 DIAGNOSIS — I10 Essential (primary) hypertension: Secondary | ICD-10-CM | POA: Insufficient documentation

## 2018-04-26 DIAGNOSIS — Z79899 Other long term (current) drug therapy: Secondary | ICD-10-CM | POA: Insufficient documentation

## 2018-04-26 DIAGNOSIS — R030 Elevated blood-pressure reading, without diagnosis of hypertension: Secondary | ICD-10-CM

## 2018-04-26 LAB — GROUP A STREP BY PCR: GROUP A STREP BY PCR: DETECTED — AB

## 2018-04-26 MED ORDER — AMLODIPINE BESYLATE 5 MG PO TABS
10.0000 mg | ORAL_TABLET | Freq: Once | ORAL | Status: AC
  Administered 2018-04-26: 10 mg via ORAL
  Filled 2018-04-26: qty 2

## 2018-04-26 MED ORDER — ACETAMINOPHEN 500 MG PO TABS
1000.0000 mg | ORAL_TABLET | Freq: Once | ORAL | Status: AC
  Administered 2018-04-26: 1000 mg via ORAL
  Filled 2018-04-26: qty 2

## 2018-04-26 MED ORDER — AMOXICILLIN 500 MG PO CAPS: 500.0000 mg | ORAL_CAPSULE | Freq: Three times a day (TID) | ORAL | 0 refills | Status: DC

## 2018-04-26 MED ORDER — AMOXICILLIN 250 MG PO CAPS
500.0000 mg | ORAL_CAPSULE | Freq: Once | ORAL | Status: AC
  Administered 2018-04-26: 500 mg via ORAL
  Filled 2018-04-26: qty 2

## 2018-04-26 NOTE — ED Triage Notes (Signed)
Pt reports sore throat since last night with difficulty swallowing.  No other s/s related to illness.

## 2018-04-26 NOTE — Discharge Instructions (Addendum)
Your strep test is POSITIVE. Please use your mask until symptoms resolve. Use amoxil three times daily until all taken. Use 400mg  of ibuprofen and 1000 mg of tylenol every 6 hours for aching and fever. Please wash hands frequently and increase fluids. This is contagious, please use caution until symptoms resolve. Your blood pressure was elevated today. Please have this rechecked soon.

## 2018-04-26 NOTE — ED Provider Notes (Signed)
[facility] EMERGENCY DEPARTMENT Provider Note   CSN: [id number] Arrival date & time: 04/26/18  4098     History   Chief Complaint Chief Complaint  Patient presents with  . Sore Throat    HPI Nicole Travis is a 37 y.o. female.  The history is provided by the patient.  Sore Throat  This is a new problem. The problem occurs constantly. The problem has been gradually worsening. Associated symptoms include headaches. Pertinent negatives include no chest pain, no abdominal pain and no shortness of breath. Associated symptoms comments: Nasal congestion, fatigue. The symptoms are aggravated by swallowing. Nothing relieves the symptoms. She has tried acetaminophen for the symptoms. The treatment provided no relief.    Past Medical History:  Diagnosis Date  . Headache   . Hypertension   . Stroke [facility])     Patient Active Problem List   Diagnosis Date Noted  . Facial cellulitis 12/01/2015  . Migraine 08/02/2015  . 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 Hemiparesis (HCC) 11/05/2014  . 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 Convulsions/seizures (HCC) 10/31/2014    Past Surgical History:  Procedure Laterality Date  . CESAREAN SECTION    . TUBAL LIGATION       OB History    Gravida  2   Para  2   Term  2   Preterm      AB      Living  2     SAB      TAB      Ectopic      Multiple      Live Births               Home Medications    Prior to Admission medications   Medication Sig Start Date End Date Taking? Authorizing Provider  ibuprofen (ADVIL,MOTRIN) 200 MG tablet Take 800 mg by mouth every 6 (six) hours as needed for mild pain.   Yes [provider]  amoxicillin (AMOXIL) 500 MG capsule Take 1 capsule (500 mg total) by mouth 3 (three) times daily. 04/26/18   Ivery Quale, PA-C  lisinopril (PRINIVIL,ZESTRIL) 10 MG tablet Take 1 tablet (10 mg total) by mouth daily.  09/23/17   Burgess Amor, PA-C    Family History Family History  Problem Relation Age of Onset  . Cervical cancer Mother   . Stroke Maternal Grandmother   . Heart attack Maternal Grandmother   . Heart attack Brother   . Breast cancer Sister   . Breast cancer Sister   . Breast cancer Sister     Social History Social History   Tobacco Use  . Smoking status: Never Smoker  . Smokeless tobacco: Never Used  Substance Use Topics  . Alcohol use: No  . Drug use: No     Allergies   Celexa [citalopram hydrobromide]   Review of Systems Review of Systems  Constitutional: Positive for activity change, appetite change and fatigue.       All ROS Neg except as noted in HPI  HENT: Positive for congestion and sore throat. Negative for nosebleeds.   Eyes: Negative for photophobia and discharge.  Respiratory: Negative for cough, shortness of breath and wheezing.   Cardiovascular: Negative for chest pain and palpitations.  Gastrointestinal: Negative for abdominal pain and blood in stool.  Genitourinary: Negative for dysuria, frequency and hematuria.  Musculoskeletal: Negative for arthralgias, back pain and neck pain.  Skin: Negative.   Neurological:  Positive for headaches. Negative for dizziness, seizures and speech difficulty.  Psychiatric/Behavioral: Negative for confusion and hallucinations.     Physical Exam Updated Vital Signs BP (!) 188/102 (BP Location: Left Arm)   Pulse (!) 101   Temp 98.8 F (37.1 C) (Oral)   Resp 18   Ht 5' (1.524 m)   Wt 124.7 kg   LMP 04/21/2018   SpO2 97%   BMI 53.71 kg/m   Physical Exam  Constitutional: She is oriented to person, place, and time. She appears well-developed and well-nourished.  Non-toxic appearance.  HENT:  Head: Normocephalic.  Right Ear: Tympanic membrane and external ear normal.  Left Ear: Tympanic membrane and external ear normal.  Nasal congestion present. Increase redness of the posterior pharynx. Airway patent.  Eyes:  Pupils are equal, round, and reactive to light. EOM and lids are normal.  Neck: Normal range of motion. Neck supple. Carotid bruit is not present.  Cardiovascular: Regular rhythm, normal heart sounds, intact distal pulses and normal pulses. Tachycardia present.  Pulmonary/Chest: Breath sounds normal. No respiratory distress.  Abdominal: Soft. Bowel sounds are normal. There is no tenderness. There is no guarding.  Musculoskeletal: Normal range of motion.  Lymphadenopathy:       Head (right side): No submandibular adenopathy present.       Head (left side): No submandibular adenopathy present.    She has no cervical adenopathy.  Neurological: She is alert and oriented to person, place, and time. She has normal strength. No cranial nerve deficit or sensory deficit.  Skin: Skin is warm and dry.  Psychiatric: She has a normal mood and affect. Her speech is normal.  Nursing note and vitals reviewed.    ED Treatments / Results  Labs (all labs ordered are listed, but only abnormal results are displayed) Labs Reviewed  GROUP A STREP BY PCR - Abnormal; Notable for the following components:      Result Value   Group A Strep by PCR DETECTED (*)    All other components within normal limits    EKG None  Radiology Dg Chest 2 View  Result Date: 04/26/2018 CLINICAL DATA:  Upper respiratory infection EXAM: CHEST - 2 VIEW COMPARISON:  03/12/2017 chest radiograph. FINDINGS: Stable cardiomediastinal silhouette with top-normal heart size. No pneumothorax. No pleural effusion. Lungs appear clear, with no acute consolidative airspace disease and no pulmonary edema. IMPRESSION: No active cardiopulmonary disease. Electronically Signed   By: Delbert Phenix M.D.   On: 04/26/2018 09:39    Procedures Procedures (including critical care time)  Medications Ordered in ED Medications  amoxicillin (AMOXIL) capsule 500 mg (has no administration in time range)  amLODipine (NORVASC) tablet 10 mg (10 mg Oral Given  04/26/18 0947)  acetaminophen (TYLENOL) tablet 1,000 mg (1,000 mg Oral Given 04/26/18 0947)     Initial Impression / Assessment and Plan / ED Course  I have reviewed the triage vital signs and the nursing notes.  Pertinent labs & imaging results that were available during my care of the patient were reviewed by me and considered in my medical decision making (see chart for details).       Final Clinical Impressions(s) / ED Diagnoses MDM  Blood pressure elevated on admission to the emergency department at 188/102.  Heart rate elevated at 101.  This oximetry is within normal limits at 97% on room air.  Patient has sore throat with upper respiratory symptoms.  Chest x-ray was evaluated, and no significant abnormality noted.  Strep screen was obtained and  is positive for strep.  Patient will be treated with Amoxil, as well as Tylenol and ibuprofen every 6 hours.  I discussed with the patient the importance of good handwashing and good hydration.  We discussed the contagious nature of this problem.  Patient acknowledges understanding of these instructions.   Final diagnoses:  Strep pharyngitis    ED Discharge Orders         Ordered    amoxicillin (AMOXIL) 500 MG capsule  3 times daily     04/26/18 1032           Ivery Quale, PA-C 04/26/18 1046    Terrilee Files, MD 04/26/18 Paulo Fruit

## 2018-05-12 ENCOUNTER — Encounter (HOSPITAL_COMMUNITY): Payer: Self-pay | Admitting: Emergency Medicine

## 2018-05-12 ENCOUNTER — Other Ambulatory Visit: Payer: Self-pay

## 2018-05-12 ENCOUNTER — Emergency Department (HOSPITAL_COMMUNITY)
Admission: EM | Admit: 2018-05-12 | Discharge: 2018-05-12 | Disposition: A | Discharge status: Home / Self Care | Payer: Self-pay | Attending: Emergency Medicine | Admitting: Emergency Medicine

## 2018-05-12 DIAGNOSIS — J020 Streptococcal pharyngitis: Secondary | ICD-10-CM | POA: Insufficient documentation

## 2018-05-12 DIAGNOSIS — I10 Essential (primary) hypertension: Secondary | ICD-10-CM | POA: Insufficient documentation

## 2018-05-12 DIAGNOSIS — Z79899 Other long term (current) drug therapy: Secondary | ICD-10-CM | POA: Insufficient documentation

## 2018-05-12 DIAGNOSIS — Z8673 Personal history of transient ischemic attack (TIA), and cerebral infarction without residual deficits: Secondary | ICD-10-CM | POA: Insufficient documentation

## 2018-05-12 LAB — GROUP A STREP BY PCR: GROUP A STREP BY PCR: DETECTED — AB

## 2018-05-12 MED ORDER — LISINOPRIL 10 MG PO TABS: 10.0000 mg | ORAL_TABLET | Freq: Every day | ORAL | 0 refills | Status: DC

## 2018-05-12 MED ORDER — AMOXICILLIN-POT CLAVULANATE 875-125 MG PO TABS: 1.0000 | ORAL_TABLET | Freq: Two times a day (BID) | ORAL | 0 refills | Status: AC

## 2018-05-12 NOTE — Discharge Instructions (Signed)
If you develop high fever, vomiting, trouble breathing, inability to swallow, worsening pain, or any other new/concerning symptoms then return to the ER for evaluation.

## 2018-05-12 NOTE — ED Provider Notes (Signed)
[facility] EMERGENCY DEPARTMENT Provider Note   CSN: [id number] Arrival date & time: 05/12/18  7253     History   Chief Complaint Chief Complaint  Patient presents with  . Sore Throat    HPI Nicole Travis is a 37 y.o. female.  HPI  37 year old female presents with recurrent sore throat.  She was here about 2 weeks ago and just recently finished up her 10-day course of amoxicillin.  She states all of the symptoms seem to improve towards the end of that treatment but then started coming back.  For 3 days she is been having sore throat, nasal congestion and postnasal drip.  It is painful to swallow but she is able to.  There is no shortness of breath.  She developed a mild nonproductive cough this morning.  No fevers.  All of the symptoms are the exact same as before.  No neck pain.  She is been taking lozenges, ibuprofen and Tylenol with partial relief.  Rates pain is about an 8 or 9 out of 10.  She is noted to be hypertensive and states she does not have insurance and so she has not been taking her high blood pressure medicines.  Past Medical History:  Diagnosis Date  . Headache   . Hypertension   . Stroke [facility])     Patient Active Problem List   Diagnosis Date Noted  . Facial cellulitis 12/01/2015  . Migraine 08/02/2015  . 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 Hemiparesis (HCC) 11/05/2014  . 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 Convulsions/seizures (HCC) 10/31/2014    Past Surgical History:  Procedure Laterality Date  . CESAREAN SECTION    . TUBAL LIGATION       OB History    Gravida  2   Para  2   Term  2   Preterm      AB      Living  2     SAB      TAB      Ectopic      Multiple      Live Births               Home Medications    Prior to Admission medications   Medication Sig Start Date End Date Taking? Authorizing Provider  amoxicillin-clavulanate  (AUGMENTIN) 875-125 MG tablet Take 1 tablet by mouth 2 (two) times daily for 10 days. One po bid x 7 days 05/12/18 05/22/18  Pricilla Loveless, MD  ibuprofen (ADVIL,MOTRIN) 200 MG tablet Take 800 mg by mouth every 6 (six) hours as needed for mild pain.    [provider]  lisinopril (PRINIVIL,ZESTRIL) 10 MG tablet Take 1 tablet (10 mg total) by mouth daily. 05/12/18   Pricilla Loveless, MD    Family History Family History  Problem Relation Age of Onset  . Cervical cancer Mother   . Stroke Maternal Grandmother   . Heart attack Maternal Grandmother   . Heart attack Brother   . Breast cancer Sister   . Breast cancer Sister   . Breast cancer Sister     Social History Social History   Tobacco Use  . Smoking status: Never Smoker  . Smokeless tobacco: Never Used  Substance Use Topics  . Alcohol use: No  . Drug use: No     Allergies   Celexa [citalopram hydrobromide]   Review of Systems Review of Systems  Constitutional: Negative for fever.  HENT: Positive for congestion, postnasal drip and sneezing.   Respiratory: Positive for cough. Negative for shortness of breath.   Gastrointestinal: Negative for vomiting.  Musculoskeletal: Negative for neck pain.     Physical Exam Updated Vital Signs BP (!) 162/93 (BP Location: Left Arm)   Pulse 84   Temp 97.7 F (36.5 C) (Oral)   Resp 16   Ht 5' (1.524 m)   Wt 124 kg   LMP 04/21/2018   SpO2 97%   BMI 53.39 kg/m   Physical Exam  Constitutional: She appears well-developed and well-nourished.  Non-toxic appearance. She does not appear ill. No distress.  obese  HENT:  Head: Normocephalic and atraumatic.  Right Ear: Tympanic membrane and external ear normal.  Left Ear: Tympanic membrane and external ear normal.  Nose: Nose normal.  Mouth/Throat: Uvula is midline. No oropharyngeal exudate, posterior oropharyngeal edema, posterior oropharyngeal erythema or tonsillar abscesses. Tonsils are 1+ on the right. Tonsils are 1+ on  the left. No tonsillar exudate.  Uvula appears mildly enlarged but is not inflamed/red. Unclear if normal variant for patient. Is midline.  Eyes: Right eye exhibits no discharge. Left eye exhibits no discharge.  Neck: Neck supple.  Cardiovascular: Normal rate, regular rhythm and normal heart sounds.  Pulmonary/Chest: Effort normal and breath sounds normal. She has no wheezes. She has no rales.  Lymphadenopathy:    She has no cervical adenopathy.  Neurological: She is alert.  Skin: Skin is warm and dry.  Psychiatric: Her mood appears not anxious.  Nursing note and vitals reviewed.    ED Treatments / Results  Labs (all labs ordered are listed, but only abnormal results are displayed) Labs Reviewed  GROUP A STREP BY PCR - Abnormal; Notable for the following components:      Result Value   Group A Strep by PCR DETECTED (*)    All other components within normal limits    EKG None  Radiology No results found.  Procedures Procedures (including critical care time)  Medications Ordered in ED Medications - No data to display   Initial Impression / Assessment and Plan / ED Course  I have reviewed the triage vital signs and the nursing notes.  Pertinent labs & imaging results that were available during my care of the patient were reviewed by me and considered in my medical decision making (see chart for details).     Patient overall appears well.  She has no shortness of breath, increased work of breathing or hypoxia with clear lung sounds and so I think pneumonia is pretty unlikely.  As for her primary symptoms of pharyngitis, I will cover for strep with Augmentin.  She could have had recurrent strep, not fully treated or liver eradicated strep, or she could be a carrier.  I discussed these options with her but I think antibiotics are reasonable.  Uvula is slightly enlarged but not really inflamed and is certainly midline.  There is no obvious abscess or deep space infection.  We  discussed return precautions but she otherwise appears stable for discharge home.  Offered her ENT follow-up which she declines.  I stressed the importance of following up with the PCP and treatment for her blood pressure.  I will restart her back on her home lisinopril.  She is currently waiting on insurance to kick in before finding a PCP.  Final Clinical Impressions(s) / ED Diagnoses   Final diagnoses:  Strep pharyngitis  Essential hypertension    ED Discharge Orders  Ordered    amoxicillin-clavulanate (AUGMENTIN) 875-125 MG tablet  2 times daily     05/12/18 0826    lisinopril (PRINIVIL,ZESTRIL) 10 MG tablet  Daily     05/12/18 0826           Pricilla Loveless, MD 05/12/18 [phone]

## 2018-05-12 NOTE — ED Triage Notes (Signed)
Pt c/o sore throat. States she was tx for strep throat 2 weeks ago. Finished course of amoxicillin and 2 days later symptoms returned. Denies fever. Airway patent.

## 2018-06-14 IMAGING — DX DG CHEST 2V
2 series · 2 of 2 positions shown · non-contrast
Comparison: 06/10/2016

CLINICAL DATA: Cough and congestion for 2 days

EXAM:
CHEST  2 VIEW

[chest pa]
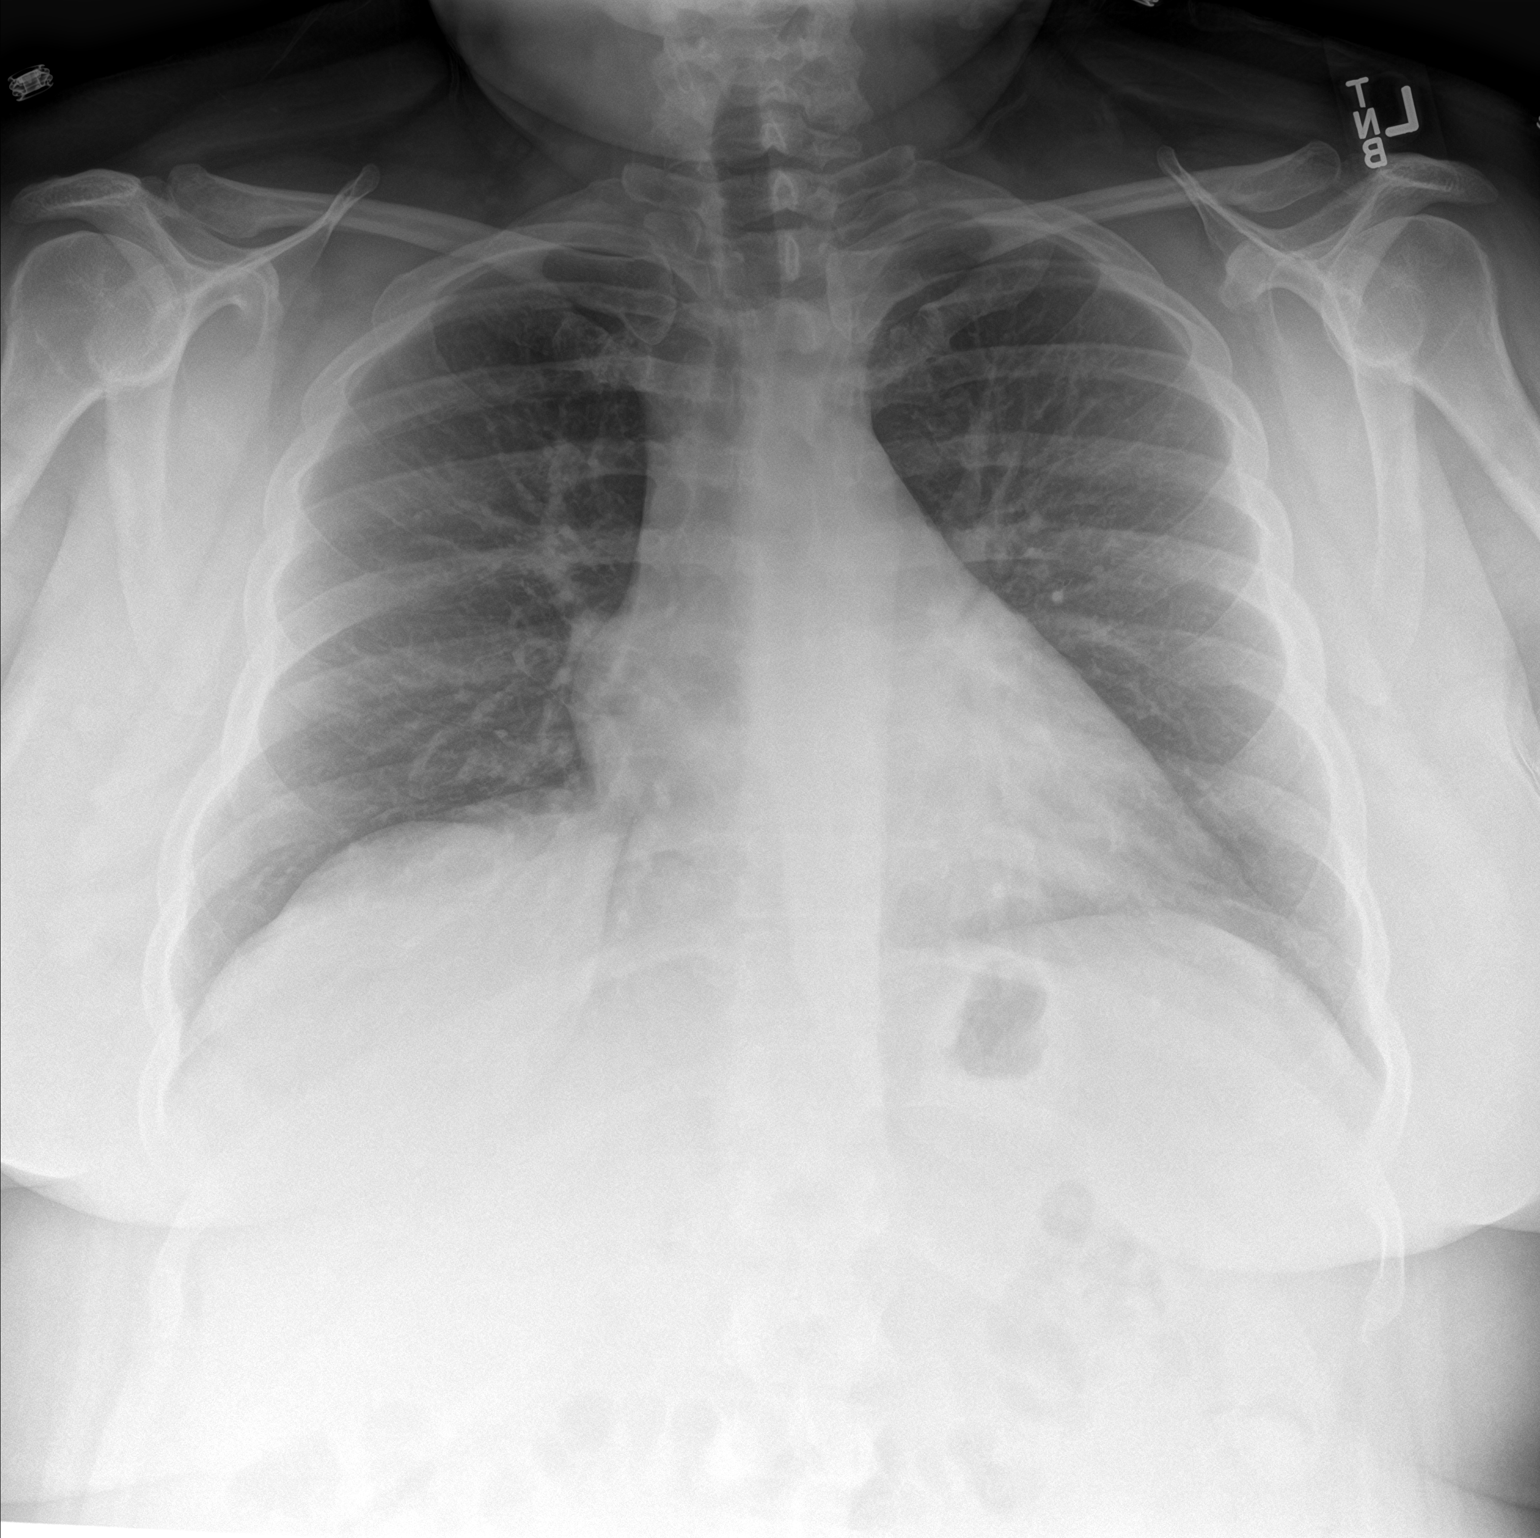

[chest lat]
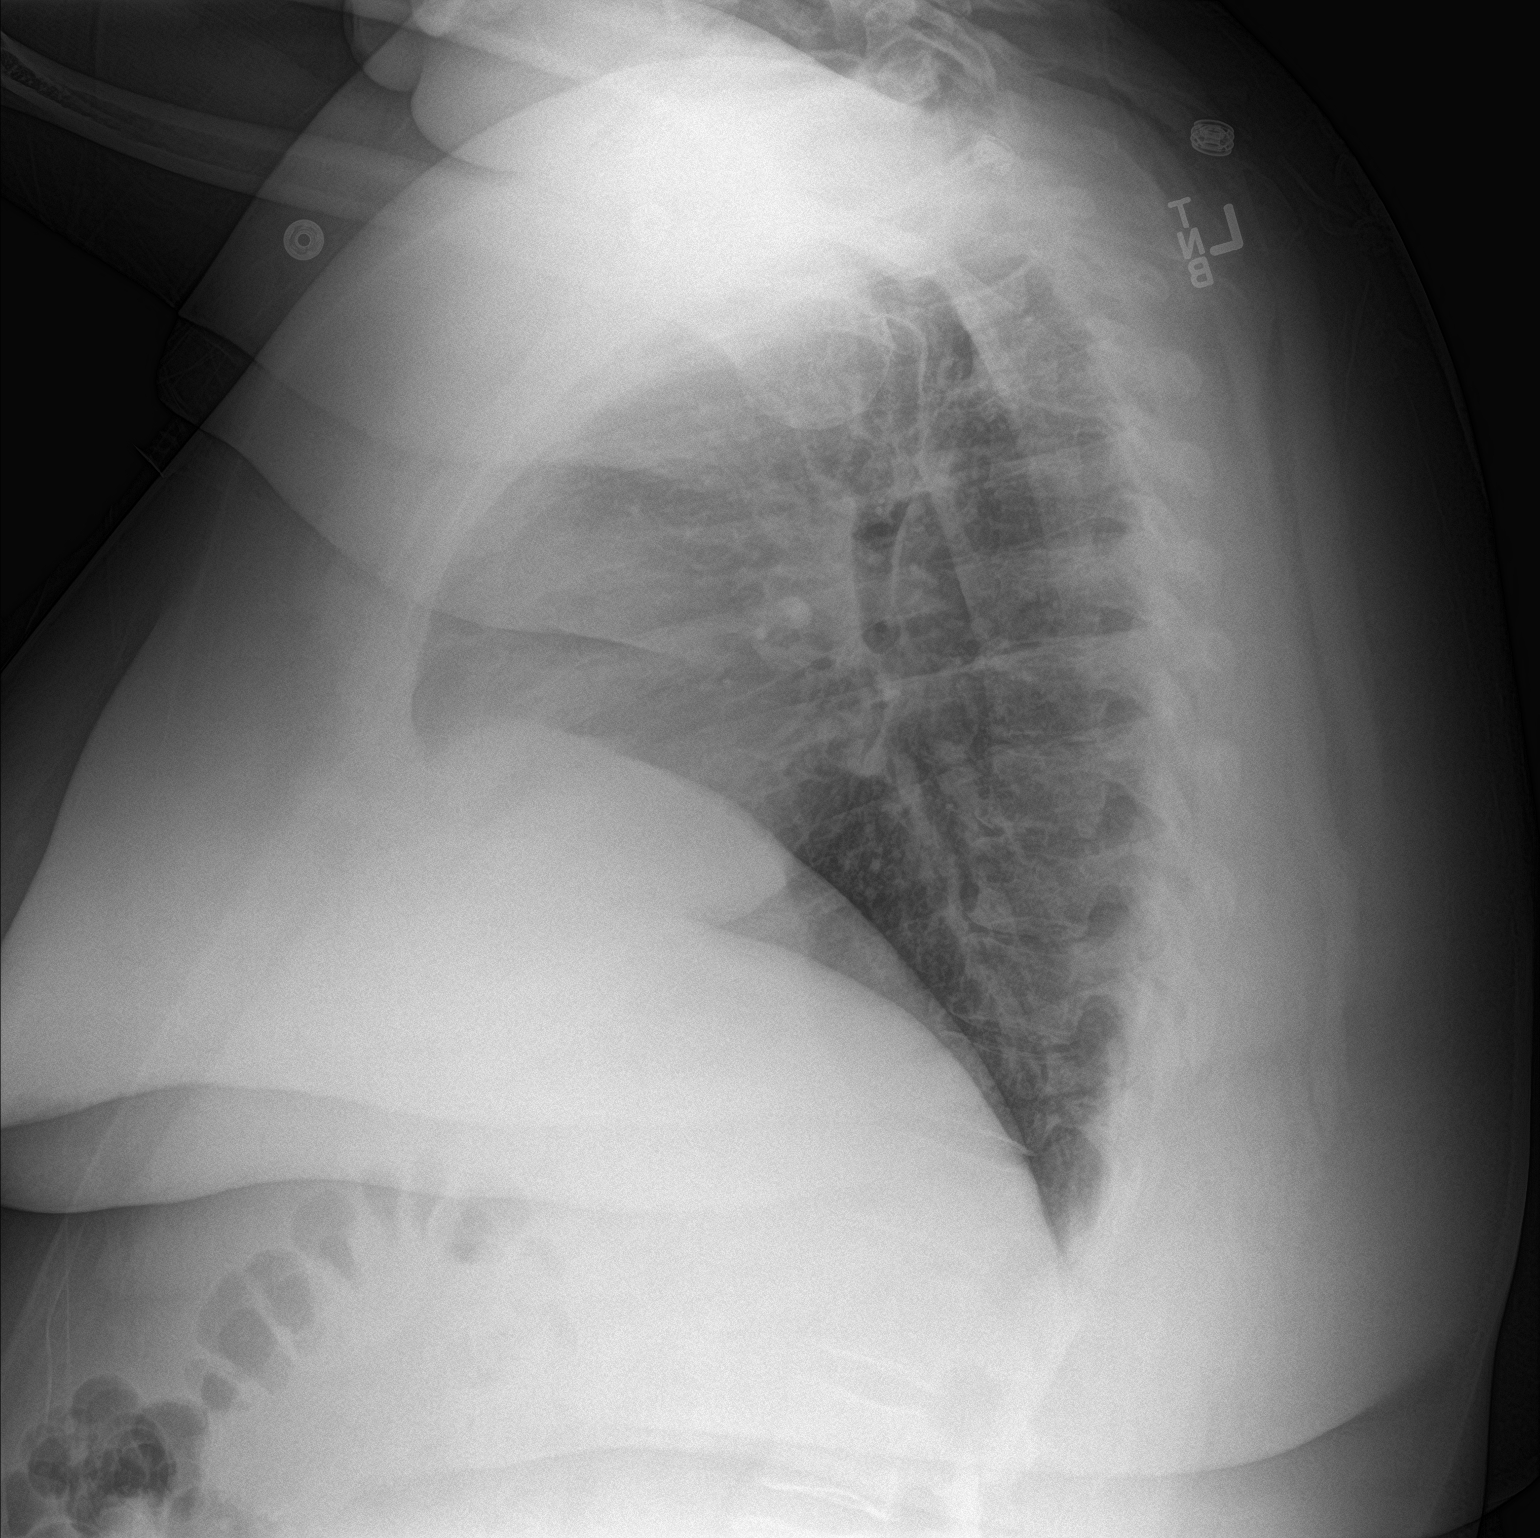

[2 of 2 positions shown; findings below may reference images not displayed]

FINDINGS: Cardiac shadow is within normal limits. Lungs are well aerated
bilaterally. No focal infiltrate or sizable effusion is seen. No
acute bony abnormality is noted.
IMPRESSION: No active cardiopulmonary disease.

## 2018-08-12 ENCOUNTER — Other Ambulatory Visit: Payer: Self-pay

## 2018-08-12 ENCOUNTER — Encounter (HOSPITAL_COMMUNITY): Payer: Self-pay | Admitting: *Deleted

## 2018-08-12 ENCOUNTER — Emergency Department (HOSPITAL_COMMUNITY)
Admission: EM | Admit: 2018-08-12 | Discharge: 2018-08-12 | Discharge status: Against Medical Advice | Payer: Self-pay | Attending: Emergency Medicine | Admitting: Emergency Medicine

## 2018-08-12 DIAGNOSIS — I69351 Hemiplegia and hemiparesis following cerebral infarction affecting right dominant side: Secondary | ICD-10-CM | POA: Insufficient documentation

## 2018-08-12 DIAGNOSIS — I10 Essential (primary) hypertension: Secondary | ICD-10-CM | POA: Insufficient documentation

## 2018-08-12 DIAGNOSIS — R102 Pelvic and perineal pain: Secondary | ICD-10-CM | POA: Insufficient documentation

## 2018-08-12 DIAGNOSIS — N938 Other specified abnormal uterine and vaginal bleeding: Secondary | ICD-10-CM | POA: Insufficient documentation

## 2018-08-12 DIAGNOSIS — Z79899 Other long term (current) drug therapy: Secondary | ICD-10-CM | POA: Insufficient documentation

## 2018-08-12 HISTORY — DX: Other specified abnormal uterine and vaginal bleeding: N93.8

## 2018-08-12 HISTORY — DX: Central pain syndrome: G89.0

## 2018-08-12 HISTORY — DX: Other symptoms and signs involving the nervous system: R29.818

## 2018-08-12 HISTORY — DX: Migraine, unspecified, not intractable, without status migrainosus: G43.909

## 2018-08-12 NOTE — ED Notes (Signed)
Pt to desk stating "no one here can help me", pt requesting to leave AMA, discussed benefits and risks of leaving AMA, pt still wishes to leave

## 2018-08-12 NOTE — ED Triage Notes (Addendum)
Pt c/o lower abdominal pain. Pt reports being on her menstrual cycle since August of 2019, stopping only for a few days at a time. Pt reports she is passing blood clots, using 1 pad every 30-45 minutes. Pt reports the pain has lessened over the last hour. Denies n/v/d. Pt reports she did not take her BP medication this morning.

## 2018-08-12 NOTE — ED Provider Notes (Signed)
[facility] EMERGENCY DEPARTMENT Provider Note   CSN: [id number] Arrival date & time: 08/12/18  1435     History   Chief Complaint Chief Complaint  Patient presents with  . Abdominal Pain    HPI Nicole Travis is a 38 y.o. female.  HPI Pt was seen at 2000. Per pt, c/o gradual onset and persistence of near constant vaginal bleeding for the past 5 months. Has been associated with pelvic "cramping."  Hx DUB, has not f/u with OB/GYN MD. Denies dysuria/hematuria, no flank pain, no N/V/D, no fevers, no rash, no CP/SOB.    Past Medical History:  Diagnosis Date  . Acute confusional migraine   . DUB (dysfunctional uterine bleeding)   . Headache   . Hemisensory deficit    right  . Hypertension   . Stroke (HCC)   . Thalamic pain syndrome     Patient Active Problem List   Diagnosis Date Noted  . Facial cellulitis 12/01/2015  . Migraine 08/02/2015  . 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 Hemiparesis (HCC) 11/05/2014  . 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 Convulsions/seizures (HCC) 10/31/2014    Past Surgical History:  Procedure Laterality Date  . CESAREAN SECTION    . TUBAL LIGATION       OB History    Gravida  2   Para  2   Term  2   Preterm      AB      Living  2     SAB      TAB      Ectopic      Multiple      Live Births               Home Medications    Prior to Admission medications   Medication Sig Start Date End Date Taking? Authorizing Provider  ibuprofen (ADVIL,MOTRIN) 200 MG tablet Take 800 mg by mouth every 6 (six) hours as needed for mild pain.    [provider]  lisinopril (PRINIVIL,ZESTRIL) 10 MG tablet Take 1 tablet (10 mg total) by mouth daily. 05/12/18   Pricilla Loveless, MD    Family History Family History  Problem Relation Age of Onset  . Cervical cancer Mother   . Stroke Maternal Grandmother   . Heart attack Maternal Grandmother    . Heart attack Brother   . Breast cancer Sister   . Breast cancer Sister   . Breast cancer Sister     Social History Social History   Tobacco Use  . Smoking status: Never Smoker  . Smokeless tobacco: Never Used  Substance Use Topics  . Alcohol use: No  . Drug use: No     Allergies   Celexa [citalopram hydrobromide]   Review of Systems Review of Systems ROS: Statement: All systems negative except as marked or noted in the HPI; Constitutional: Negative for fever and chills. ; ; Eyes: Negative for eye pain, redness and discharge. ; ; ENMT: Negative for ear pain, hoarseness, nasal congestion, sinus pressure and sore throat. ; ; Cardiovascular: Negative for chest pain, palpitations, diaphoresis, dyspnea and peripheral edema. ; ; Respiratory: Negative for cough, wheezing and stridor. ; ; Gastrointestinal: Negative for nausea, vomiting, diarrhea, abdominal pain, blood in stool, hematemesis, jaundice and rectal bleeding. . ; ; Genitourinary: Negative for dysuria, flank pain and hematuria. ; ; GYN:  +pelvic cramping, +vaginal bleeding, no vaginal discharge, no vulvar pain. ;; Musculoskeletal: Negative for back  pain and neck pain. Negative for swelling and trauma.; ; Skin: Negative for pruritus, rash, abrasions, blisters, bruising and skin lesion.; ; Neuro: Negative for headache, lightheadedness and neck stiffness. Negative for weakness, altered level of consciousness, altered mental status, extremity weakness, paresthesias, involuntary movement, seizure and syncope.       Physical Exam Updated Vital Signs BP (!) 173/105 Comment: recheck of BP  Pulse 80   Temp 98 F (36.7 C) (Oral)   Resp 19   Ht 5' (1.524 m)   Wt 124.7 kg   LMP 08/07/2018   SpO2 100%   BMI 53.71 kg/m   Physical Exam 2005: Physical examination:  Nursing notes reviewed; Vital signs and O2 SAT reviewed;  Constitutional: Well developed, Well nourished, Well hydrated, In no acute distress; Head:  Normocephalic,  atraumatic; Eyes: EOMI, PERRL, No scleral icterus; ENMT: Mouth and pharynx normal, Mucous membranes moist; Neck: Supple, Full range of motion, No lymphadenopathy; Cardiovascular: Regular rate and rhythm, No gallop; Respiratory: Breath sounds clear & equal bilaterally, No wheezes.  Speaking full sentences with ease, Normal respiratory effort/excursion; Chest: Nontender, Movement normal; Abdomen: Soft, +suprapubic tenderness to palp. No rebound or guarding. Nondistended, Normal bowel sounds; Genitourinary: No CVA tenderness; Extremities: Peripheral pulses normal, No tenderness, No edema, No calf edema or asymmetry.; Neuro: AA&Ox3, Major CN grossly intact.  Speech clear. No gross focal motor or sensory deficits in extremities. Climbs on and off stretcher easily by herself. Gait steady..; Skin: Color normal, Warm, Dry.   ED Treatments / Results  Labs (all labs ordered are listed, but only abnormal results are displayed)   EKG None  Radiology   Procedures Procedures (including critical care time)  Medications Ordered in ED Medications - No data to display   Initial Impression / Assessment and Plan / ED Course  I have reviewed the triage vital signs and the nursing notes.  Pertinent labs & imaging results that were available during my care of the patient were reviewed by me and considered in my medical decision making (see chart for details).  MDM Reviewed: previous chart, nursing note and vitals   2010:  Pt's expectations of ED visit was "finding out exacting what is wrong," including possibility of ablation. Pt's mother however, has more realistic expectations of ED visit, and is aware that pt would need OB/GYN MD for definitive tx, including long term hormonal therapy, ablation. I explained ED role in healthcare continuum, as well as need for OB/GYN follow up care. Pt then stated she was "leaving if you aren't going to help me." I again reiterated that I would help her, by checking H/H,  UA, pelvic exam, provide pain control, then refer her to OB/GYN MD. Pt stated that "none of that his helping" because she apparently wants a definitive answer/tx for her symptoms today. I again offered above; pt refuses. Pt then walked out of the ED; gait steady, resps easy, NAD.       Final Clinical Impressions(s) / ED Diagnoses   Final diagnoses:  Pelvic pain in female  DUB (dysfunctional uterine bleeding)    ED Discharge Orders    None       Samuel Jester, DO 08/17/18 1226

## 2021-04-28 ENCOUNTER — Emergency Department (HOSPITAL_COMMUNITY): Payer: Self-pay

## 2021-04-28 ENCOUNTER — Encounter (HOSPITAL_COMMUNITY): Payer: Self-pay | Admitting: *Deleted

## 2021-04-28 ENCOUNTER — Emergency Department (HOSPITAL_COMMUNITY)
Admission: EM | Admit: 2021-04-28 | Discharge: 2021-04-28 | Disposition: A | Discharge status: Home / Self Care | Payer: Self-pay | Attending: Emergency Medicine | Admitting: Emergency Medicine

## 2021-04-28 ENCOUNTER — Other Ambulatory Visit: Payer: Self-pay

## 2021-04-28 DIAGNOSIS — R030 Elevated blood-pressure reading, without diagnosis of hypertension: Secondary | ICD-10-CM

## 2021-04-28 DIAGNOSIS — J209 Acute bronchitis, unspecified: Secondary | ICD-10-CM | POA: Insufficient documentation

## 2021-04-28 DIAGNOSIS — Z79899 Other long term (current) drug therapy: Secondary | ICD-10-CM | POA: Insufficient documentation

## 2021-04-28 DIAGNOSIS — I10 Essential (primary) hypertension: Secondary | ICD-10-CM | POA: Insufficient documentation

## 2021-04-28 DIAGNOSIS — R1110 Vomiting, unspecified: Secondary | ICD-10-CM | POA: Insufficient documentation

## 2021-04-28 DIAGNOSIS — Z20822 Contact with and (suspected) exposure to covid-19: Secondary | ICD-10-CM | POA: Insufficient documentation

## 2021-04-28 LAB — BASIC METABOLIC PANEL
Anion gap: 6 (ref 5–15)
BUN: 10 mg/dL (ref 6–20)
CO2: 28 mmol/L (ref 22–32)
Calcium: 8.6 mg/dL — ABNORMAL LOW (ref 8.9–10.3)
Chloride: 105 mmol/L (ref 98–111)
Creatinine, Ser: 0.72 mg/dL (ref 0.44–1.00)
GFR, Estimated: >60 mL/min (ref >60)
Glucose, Bld: 97 mg/dL (ref 70–99)
Potassium: 4 mmol/L (ref 3.5–5.1)
Sodium: 139 mmol/L (ref 135–145)

## 2021-04-28 LAB — BRAIN NATRIURETIC PEPTIDE: B Natriuretic Peptide: 35 pg/mL (ref 0.0–100.0)

## 2021-04-28 LAB — CBC
HCT: 42.1 % (ref 36.0–46.0)
Hemoglobin: 12.9 g/dL (ref 12.0–15.0)
MCH: 26.8 pg (ref 26.0–34.0)
MCHC: 30.6 g/dL (ref 30.0–36.0)
MCV: 87.5 fL (ref 80.0–100.0)
Platelets: 378 10*3/uL (ref 150–400)
RBC: 4.81 MIL/uL (ref 3.87–5.11)
RDW: 14.6 % (ref 11.5–15.5)
WBC: 9.1 10*3/uL (ref 4.0–10.5)
nRBC: 0 % (ref 0.0–0.2)

## 2021-04-28 LAB — RESP PANEL BY RT-PCR (FLU A&B, COVID) ARPGX2
Influenza A by PCR: NEGATIVE
Influenza B by PCR: NEGATIVE
SARS Coronavirus 2 by RT PCR: NEGATIVE

## 2021-04-28 MED ORDER — ALBUTEROL SULFATE HFA 108 (90 BASE) MCG/ACT IN AERS
4.0000 | INHALATION_SPRAY | Freq: Once | RESPIRATORY_TRACT | Status: AC
  Administered 2021-04-28: 4 via RESPIRATORY_TRACT
  Filled 2021-04-28: qty 6.7

## 2021-04-28 MED ORDER — IPRATROPIUM-ALBUTEROL 0.5-2.5 (3) MG/3ML IN SOLN
3.0000 mL | Freq: Once | RESPIRATORY_TRACT | Status: AC
  Administered 2021-04-28: 3 mL via RESPIRATORY_TRACT
  Filled 2021-04-28: qty 3

## 2021-04-28 MED ORDER — PREDNISONE 50 MG PO TABS
60.0000 mg | ORAL_TABLET | Freq: Once | ORAL | Status: AC
  Administered 2021-04-28: 60 mg via ORAL
  Filled 2021-04-28: qty 1

## 2021-04-28 MED ORDER — METHYLPREDNISOLONE 4 MG PO TBPK: ORAL_TABLET | ORAL | 0 refills | Status: DC

## 2021-04-28 MED ORDER — AEROCHAMBER PLUS FLO-VU SMALL MISC
1.0000 | Freq: Once | Status: AC
  Administered 2021-04-28: 1
  Filled 2021-04-28: qty 1

## 2021-04-28 NOTE — Discharge Instructions (Addendum)
You may take a cough suppressant like Delsym or robitussin for cough suppression which can help to decrease the episodes of bronchospasm you are having daily. (Or ask the pharmacist for a Blood pressure safe OTC cough medicine) Take the Medrol Dosepak as directed starting tomorrow, 04/29/2020. Take your inhaler 2 puffs every four hours for the next 7 days. You then use it as needed every 4 hours for cough/ wheezing and tightness.  Follow up with a primary care doctor for your blood pressure.  Get help right away if: You have trouble breathing. Your wheezing and coughing do not get better after taking your medicine. You have chest pain. You have trouble speaking more than one-word sentences.

## 2021-04-28 NOTE — ED Provider Notes (Signed)
[facility] EMERGENCY DEPARTMENT Provider Note   CSN: [id number] Arrival date & time: 04/28/21  1020     History Chief Complaint  Patient presents with   Shortness of Breath    Nicole Travis is a 40 y.o. female who presents emergency department chief complaint of shortness of breath.  Patient had onset of URI symptoms which she attributed to weather change and seasonal allergies starting 2 weeks ago.  Patient has had progressively worsening cough to the point of posttussive emesis, dyspnea and wheezing.  Cough is nonproductive.  She denies fevers, chills, peripheral edema, unexplained weight gain.  Patient is notably hypertensive and states that she is not currently taking antihypertensive as she is uninsured and does not have a primary care physician.  She denies chest pain, orthopnea, PND.   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   Convulsions/seizures (HCC) 10/31/2014    Past Surgical History:  Procedure Laterality Date   CESAREAN SECTION     TUBAL LIGATION       OB History     Gravida  2   Para  2   Term  2   Preterm      AB      Living  2      SAB      IAB      Ectopic      Multiple      Live Births              Family History  Problem Relation Age of Onset   Cervical cancer Mother    Stroke Maternal Grandmother    Heart attack Maternal Grandmother    Heart attack Brother    Breast cancer Sister    Breast cancer Sister    Breast cancer Sister      Social History   Tobacco Use   Smoking status: Never   Smokeless tobacco: Never  Vaping Use   Vaping Use: Never used  Substance Use Topics   Alcohol use: No   Drug use: No    Home Medications Prior to Admission medications   Medication Sig Start Date End Date Taking? Authorizing Provider  methylPREDNISolone (MEDROL DOSEPAK) 4 MG TBPK tablet Use as directed 04/28/21  Yes Lavaughn Bisig, PA-C  ibuprofen (ADVIL,MOTRIN) 200 MG tablet Take 800 mg by mouth every 6 (six) hours as needed for mild pain.    [provider]  lisinopril (PRINIVIL,ZESTRIL) 10 MG tablet Take 1 tablet (10 mg total) by mouth daily. 05/12/18   Pricilla Loveless, MD    Allergies    Celexa [citalopram hydrobromide]  Review of Systems   Review of Systems  Respiratory:  Positive for shortness of breath.   Ten systems reviewed and are negative for acute change, except as noted in the HPI.   Physical Exam Updated Vital Signs BP (!) 175/83   Pulse 85   Temp 98 F (36.7 C)   Resp 16  Ht 5\' 1"  (1.549 m)   Wt 124.7 kg   LMP 04/21/2021   SpO2 93%   BMI 51.96 kg/m   Physical Exam Vitals and nursing note reviewed.  Constitutional:      General: She is not in acute distress.    Appearance: She is well-developed. She is not diaphoretic.  HENT:     Head: Normocephalic and atraumatic.     Right Ear: External ear normal.     Left Ear: External ear normal.     Nose: Nose normal.     Mouth/Throat:     Mouth: Mucous membranes are moist.  Eyes:     General: No scleral icterus.    Conjunctiva/sclera: Conjunctivae normal.  Cardiovascular:     Rate and Rhythm: Normal rate and regular rhythm.     Heart sounds: Normal heart sounds. No murmur heard.   No friction rub. No gallop.  Pulmonary:     Effort: Pulmonary effort is normal. No accessory muscle usage, prolonged expiration or respiratory distress.     Breath sounds: Decreased air movement present. Examination of the right-upper field reveals  wheezing. Examination of the left-upper field reveals wheezing. Examination of the right-middle field reveals wheezing. Examination of the left-middle field reveals wheezing. Examination of the right-lower field reveals wheezing. Examination of the left-lower field reveals wheezing. Wheezing present. No rhonchi.  Abdominal:     General: Bowel sounds are normal. There is no distension.     Palpations: Abdomen is soft. There is no mass.     Tenderness: There is no abdominal tenderness. There is no guarding.  Musculoskeletal:     Cervical back: Normal range of motion.  Skin:    General: Skin is warm and dry.  Neurological:     Mental Status: She is alert and oriented to person, place, and time.  Psychiatric:        Behavior: Behavior normal.    ED Results / Procedures / Treatments   Labs (all labs ordered are listed, but only abnormal results are displayed) Labs Reviewed  BASIC METABOLIC PANEL - Abnormal; Notable for the following components:      Result Value   Calcium 8.6 (*)    All other components within normal limits  RESP PANEL BY RT-PCR (FLU A&B, COVID) [id number]  BRAIN NATRIURETIC PEPTIDE  CBC    EKG EKG Interpretation  Date/Time:  Saturday April 28 2021 10:50:29 EDT Ventricular Rate:  83 PR Interval:  163 QRS Duration: 94 QT Interval:  383 QTC Calculation: 450 R Axis:   34 Text Interpretation: Sinus rhythm Normal ECG Since last tracing Rate slower Confirmed by 09-09-1974 [id number]) on 04/28/2021 11:27:12 AM  Radiology DG Chest Port 1 View  Result Date: 04/28/2021 CLINICAL DATA:  Dyspnea and cough for 2 weeks EXAM: PORTABLE CHEST 1 VIEW COMPARISON:  04/26/2018 chest radiograph. FINDINGS: Stable cardiomediastinal silhouette with top-normal heart size. No pneumothorax. No pleural effusion. Lungs appear clear, with no acute consolidative airspace disease and no pulmonary edema. IMPRESSION: No active disease. Electronically Signed   By: 06/26/2018 M.D.   On: 04/28/2021  11:17    Procedures Procedures   Medications Ordered in ED Medications  albuterol (VENTOLIN HFA) 108 (90 Base) MCG/ACT inhaler 4 puff (4 puffs Inhalation Given 04/28/21 1139)  AeroChamber Plus Flo-Vu Small device MISC 1 each (1 each Other Given 04/28/21 1142)  predniSONE (DELTASONE) tablet 60 mg (60 mg Oral Given 04/28/21 1235)  ipratropium-albuterol (DUONEB) 0.5-2.5 (3) MG/3ML nebulizer solution 3 mL (3 mLs Nebulization  Given 04/28/21 1254)    ED Course  I have reviewed the triage vital signs and the nursing notes.  Pertinent labs & imaging results that were available during my care of the patient were reviewed by me and considered in my medical decision making (see chart for details).  Clinical Course as of 04/28/21 1308  Sat Apr 28, 2021  1232 I reevaluated the patient who continues to feel winded.  She has continued expiratory wheezing.  I have ordered a DuoNeb treatment. [AH]    Clinical Course User Index [AH] Arthor Captain, PA-C   MDM Rules/Calculators/A&P                           Patient here with complaint of shortness of breath. The emergent differential diagnosis for shortness of breath includes, but is not limited to, Pulmonary edema, bronchoconstriction, Pneumonia, Pulmonary embolism, Pneumotherax/ Hemothorax, Dysrythmia, ACS.  I ordered and reviewed labs that include CBC, BMP, BNP and respiratory panel without any significant abnormality.  I also ordered and reviewed a portable 1 view chest x-ray which shows no acute abnormalities EKG shows normal sinus rhythm at a rate of 83.  Patient is hypertensive.  This has not been affected by her use of albuterol here in the emergency department.  Her breathing is significantly improved and I believe she has acute bronchitis with reactive bronchospasm.  Patient also given oral prednisone.  She has been given instruction on use of her albuterol inhaler with spacer by our respiratory therapist. Patient will be discharged with a Medrol  Dosepak, scheduled albuterol use and I have recommended she take an over-the-counter cough suppressant which is safe for high blood pressure.  Discussed outpatient follow-up and return precautions.  She appears otherwise appropriate for discharge at this time Final Clinical Impression(s) / ED Diagnoses Final diagnoses:  Acute bronchitis with bronchospasm  Elevated blood pressure reading    Rx / DC Orders ED Discharge Orders          Ordered    methylPREDNISolone (MEDROL DOSEPAK) 4 MG TBPK tablet        04/28/21 1256             Arthor Captain, PA-C 04/28/21 1309    Pollyann Savoy, MD 04/28/21 1323

## 2021-04-28 NOTE — ED Triage Notes (Signed)
Pt c/o SOB and cough x 2 weeks. Pt reports hx bronchitis every year when the weather changes. Denies fever, sore throat.

## 2022-07-31 IMAGING — DX DG CHEST 1V PORT
1 series · 1 of 1 positions shown · non-contrast
Comparison: 04/26/2018 chest radiograph.

CLINICAL DATA: Dyspnea and cough for 2 weeks

EXAM:
PORTABLE CHEST 1 VIEW

[chest ap]
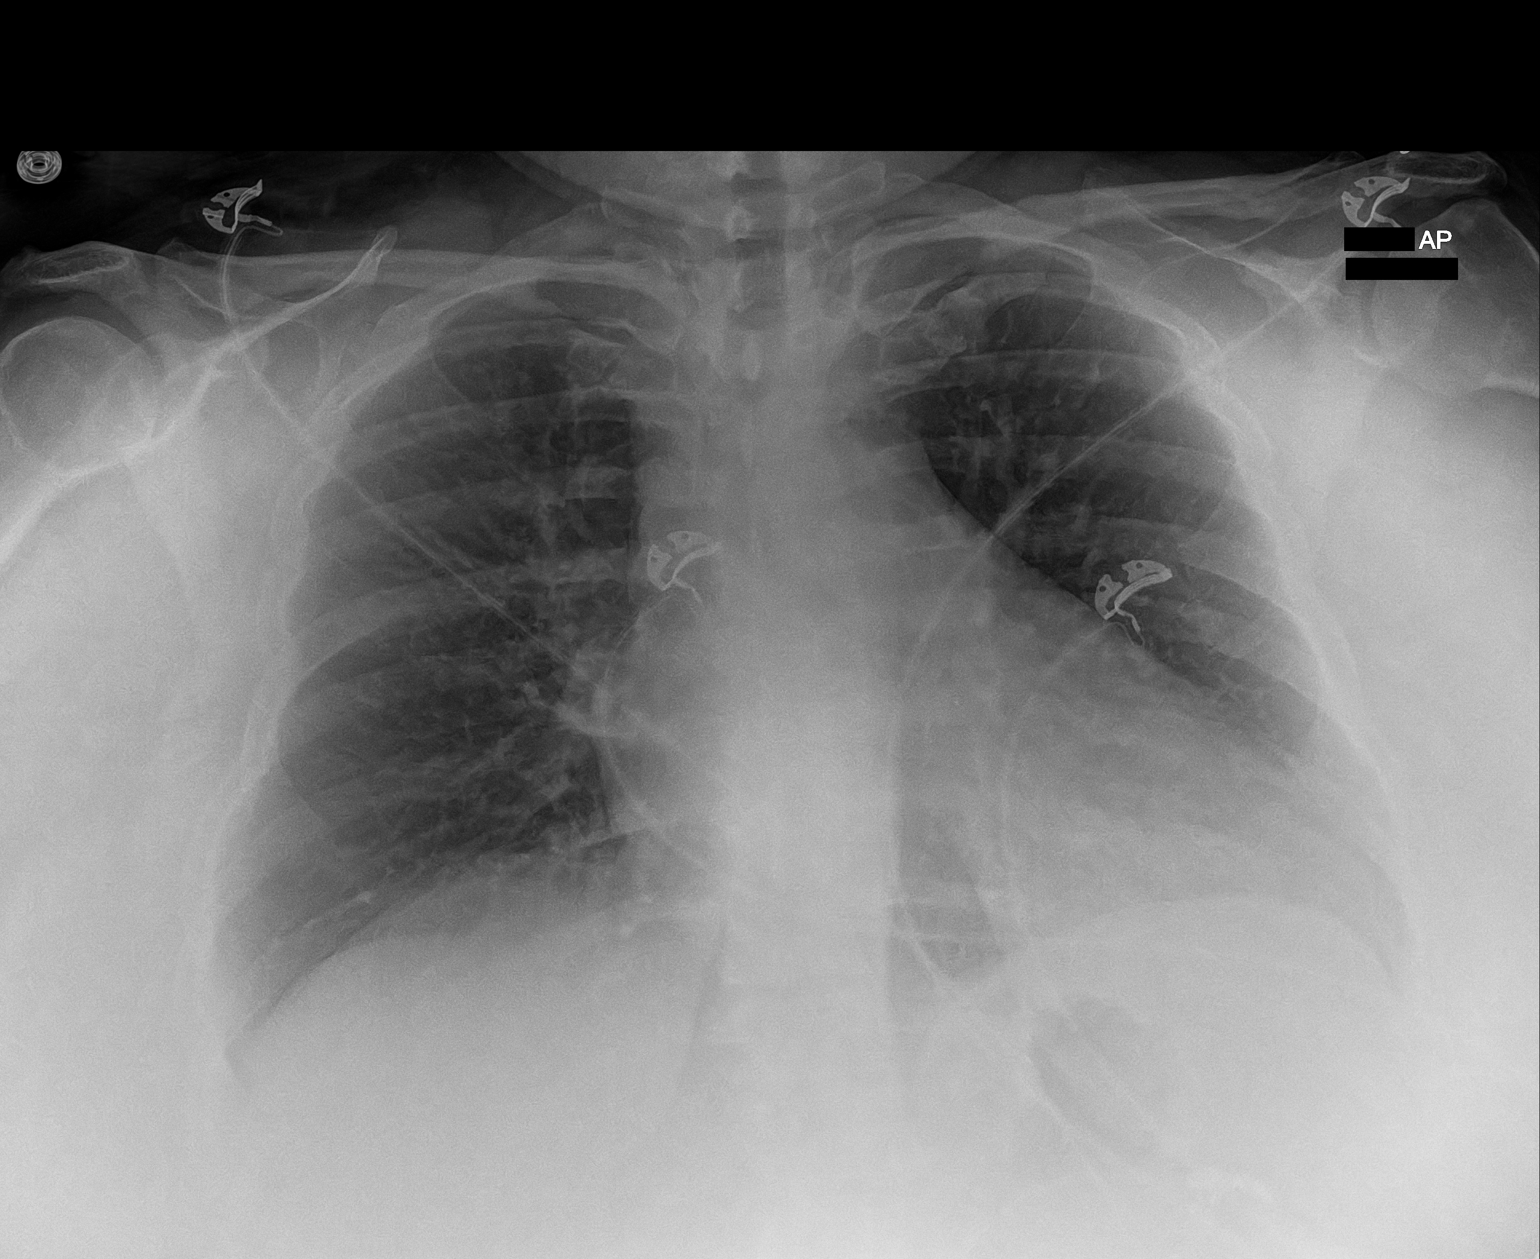

[1 of 1 positions shown; findings below may reference images not displayed]

FINDINGS: Stable cardiomediastinal silhouette with top-normal heart size. No
pneumothorax. No pleural effusion. Lungs appear clear, with no acute
consolidative airspace disease and no pulmonary edema.
IMPRESSION: No active disease.

## 2023-11-23 ENCOUNTER — Emergency Department (HOSPITAL_COMMUNITY): Payer: MEDICAID

## 2023-11-23 ENCOUNTER — Observation Stay (HOSPITAL_COMMUNITY): Payer: MEDICAID

## 2023-11-23 ENCOUNTER — Other Ambulatory Visit (HOSPITAL_COMMUNITY): Payer: Self-pay

## 2023-11-23 ENCOUNTER — Observation Stay (HOSPITAL_COMMUNITY)
Admission: EM | Admit: 2023-11-23 | Discharge: 2023-11-24 | Disposition: A | Discharge status: Home / Self Care | Payer: MEDICAID | Attending: Family Medicine | Admitting: Family Medicine

## 2023-11-23 ENCOUNTER — Encounter (HOSPITAL_COMMUNITY): Payer: Self-pay

## 2023-11-23 ENCOUNTER — Other Ambulatory Visit: Payer: Self-pay

## 2023-11-23 DIAGNOSIS — Z7901 Long term (current) use of anticoagulants: Secondary | ICD-10-CM | POA: Insufficient documentation

## 2023-11-23 DIAGNOSIS — Z6841 Body Mass Index (BMI) 40.0 and over, adult: Secondary | ICD-10-CM | POA: Insufficient documentation

## 2023-11-23 DIAGNOSIS — I639 Cerebral infarction, unspecified: Secondary | ICD-10-CM | POA: Diagnosis not present

## 2023-11-23 DIAGNOSIS — I6381 Other cerebral infarction due to occlusion or stenosis of small artery: Principal | ICD-10-CM | POA: Insufficient documentation

## 2023-11-23 DIAGNOSIS — Z79899 Other long term (current) drug therapy: Secondary | ICD-10-CM | POA: Insufficient documentation

## 2023-11-23 DIAGNOSIS — I10 Essential (primary) hypertension: Secondary | ICD-10-CM | POA: Diagnosis present

## 2023-11-23 DIAGNOSIS — R4182 Altered mental status, unspecified: Secondary | ICD-10-CM | POA: Diagnosis present

## 2023-11-23 LAB — PROTIME-INR
INR: 1 (ref 0.8–1.2)
Prothrombin Time: 12.9 s (ref 11.4–15.2)

## 2023-11-23 LAB — I-STAT CHEM 8, ED
BUN: 10 mg/dL (ref 6–20)
Calcium, Ion: 1.16 mmol/L (ref 1.15–1.40)
Chloride: 102 mmol/L (ref 98–111)
Creatinine, Ser: 0.8 mg/dL (ref 0.44–1.00)
Glucose, Bld: 115 mg/dL — ABNORMAL HIGH (ref 70–99)
HCT: 46 % (ref 36.0–46.0)
Hemoglobin: 15.6 g/dL — ABNORMAL HIGH (ref 12.0–15.0)
Potassium: 3.8 mmol/L (ref 3.5–5.1)
Sodium: 139 mmol/L (ref 135–145)
TCO2: 26 mmol/L (ref 22–32)

## 2023-11-23 LAB — DIFFERENTIAL
Abs Immature Granulocytes: 0.09 10*3/uL — ABNORMAL HIGH (ref 0.00–0.07)
Basophils Absolute: 0.1 10*3/uL (ref 0.0–0.1)
Basophils Relative: 1 %
Eosinophils Absolute: 0.1 10*3/uL (ref 0.0–0.5)
Eosinophils Relative: 1 %
Immature Granulocytes: 1 %
Lymphocytes Relative: 20 %
Lymphs Abs: 1.9 10*3/uL (ref 0.7–4.0)
Monocytes Absolute: 0.4 10*3/uL (ref 0.1–1.0)
Monocytes Relative: 4 %
Neutro Abs: 7.1 10*3/uL (ref 1.7–7.7)
Neutrophils Relative %: 73 %

## 2023-11-23 LAB — CBC
HCT: 45 % (ref 36.0–46.0)
Hemoglobin: 13.9 g/dL (ref 12.0–15.0)
MCH: 25.6 pg — ABNORMAL LOW (ref 26.0–34.0)
MCHC: 30.9 g/dL (ref 30.0–36.0)
MCV: 83 fL (ref 80.0–100.0)
Platelets: 371 10*3/uL (ref 150–400)
RBC: 5.42 MIL/uL — ABNORMAL HIGH (ref 3.87–5.11)
RDW: 14.8 % (ref 11.5–15.5)
WBC: 9.7 10*3/uL (ref 4.0–10.5)
nRBC: 0 % (ref 0.0–0.2)

## 2023-11-23 LAB — COMPREHENSIVE METABOLIC PANEL WITH GFR
ALT: 25 U/L (ref 0–44)
AST: 27 U/L (ref 15–41)
Albumin: 4 g/dL (ref 3.5–5.0)
Alkaline Phosphatase: 87 U/L (ref 38–126)
Anion gap: 11 (ref 5–15)
BUN: 11 mg/dL (ref 6–20)
CO2: 25 mmol/L (ref 22–32)
Calcium: 9.1 mg/dL (ref 8.9–10.3)
Chloride: 100 mmol/L (ref 98–111)
Creatinine, Ser: 0.7 mg/dL (ref 0.44–1.00)
GFR, Estimated: >60 mL/min (ref >60)
Glucose, Bld: 112 mg/dL — ABNORMAL HIGH (ref 70–99)
Potassium: 3.6 mmol/L (ref 3.5–5.1)
Sodium: 136 mmol/L (ref 135–145)
Total Bilirubin: 0.7 mg/dL (ref 0.0–1.2)
Total Protein: 8.9 g/dL — ABNORMAL HIGH (ref 6.5–8.1)

## 2023-11-23 LAB — LIPID PANEL
Cholesterol: 166 mg/dL (ref 0–200)
HDL: 52 mg/dL (ref >40)
LDL Cholesterol: 104 mg/dL — ABNORMAL HIGH (ref 0–99)
Total CHOL/HDL Ratio: 3.2 ratio
Triglycerides: 51 mg/dL (ref <150)
VLDL: 10 mg/dL (ref 0–40)

## 2023-11-23 LAB — ETHANOL: Alcohol, Ethyl (B): <15 mg/dL (ref <15)

## 2023-11-23 LAB — RAPID URINE DRUG SCREEN, HOSP PERFORMED
Amphetamines: NOT DETECTED
Barbiturates: NOT DETECTED
Benzodiazepines: NOT DETECTED
Cocaine: NOT DETECTED
Opiates: NOT DETECTED
Tetrahydrocannabinol: NOT DETECTED

## 2023-11-23 LAB — APTT: aPTT: 31 s (ref 24–36)

## 2023-11-23 LAB — HEMOGLOBIN A1C
Hgb A1c MFr Bld: 5.2 % (ref 4.8–5.6)
Mean Plasma Glucose: 102.54 mg/dL

## 2023-11-23 LAB — GLUCOSE, CAPILLARY
Glucose-Capillary: 107 mg/dL — ABNORMAL HIGH (ref 70–99)
Glucose-Capillary: 189 mg/dL — ABNORMAL HIGH (ref 70–99)

## 2023-11-23 LAB — HIV ANTIBODY (ROUTINE TESTING W REFLEX): HIV Screen 4th Generation wRfx: NONREACTIVE

## 2023-11-23 LAB — TSH: TSH: 1.192 u[IU]/mL (ref 0.350–4.500)

## 2023-11-23 LAB — POC URINE PREG, ED: Preg Test, Ur: NEGATIVE

## 2023-11-23 LAB — CBG MONITORING, ED: Glucose-Capillary: 117 mg/dL — ABNORMAL HIGH (ref 70–99)

## 2023-11-23 MED ORDER — ASPIRIN 81 MG PO TBEC
81.0000 mg | DELAYED_RELEASE_TABLET | Freq: Every day | ORAL | Status: DC
  Administered 2023-11-24: 81 mg via ORAL
  Filled 2023-11-23: qty 1

## 2023-11-23 MED ORDER — SODIUM CHLORIDE 0.9 % IV SOLN: INTRAVENOUS | Status: AC | PRN

## 2023-11-23 MED ORDER — ASPIRIN 81 MG PO CHEW
324.0000 mg | CHEWABLE_TABLET | Freq: Once | ORAL | Status: AC
  Administered 2023-11-23: 324 mg via ORAL
  Filled 2023-11-23: qty 4

## 2023-11-23 MED ORDER — SODIUM CHLORIDE 0.9% FLUSH
3.0000 mL | Freq: Two times a day (BID) | INTRAVENOUS | Status: DC
  Administered 2023-11-23 – 2023-11-24 (×2): 3 mL via INTRAVENOUS

## 2023-11-23 MED ORDER — LABETALOL HCL 5 MG/ML IV SOLN
10.0000 mg | INTRAVENOUS | Status: DC | PRN
  Administered 2023-11-23: 10 mg via INTRAVENOUS
  Filled 2023-11-23 (×2): qty 4

## 2023-11-23 MED ORDER — CLOPIDOGREL BISULFATE 75 MG PO TABS
75.0000 mg | ORAL_TABLET | Freq: Every day | ORAL | Status: DC
  Administered 2023-11-23 – 2023-11-24 (×2): 75 mg via ORAL
  Filled 2023-11-23 (×2): qty 1

## 2023-11-23 MED ORDER — GADOBUTROL 1 MMOL/ML IV SOLN
10.0000 mL | Freq: Once | INTRAVENOUS | Status: AC | PRN
  Administered 2023-11-23: 10 mL via INTRAVENOUS

## 2023-11-23 MED ORDER — SODIUM CHLORIDE 0.9% FLUSH: 3.0000 mL | INTRAVENOUS | Status: DC | PRN

## 2023-11-23 MED ORDER — AMLODIPINE BESYLATE 5 MG PO TABS
5.0000 mg | ORAL_TABLET | Freq: Every day | ORAL | Status: DC
  Administered 2023-11-24: 5 mg via ORAL
  Filled 2023-11-23: qty 1

## 2023-11-23 MED ORDER — ONDANSETRON HCL 4 MG PO TABS: 4.0000 mg | ORAL_TABLET | Freq: Four times a day (QID) | ORAL | Status: DC | PRN

## 2023-11-23 MED ORDER — LISINOPRIL 10 MG PO TABS
10.0000 mg | ORAL_TABLET | Freq: Every day | ORAL | Status: DC
  Administered 2023-11-23: 10 mg via ORAL
  Filled 2023-11-23: qty 1

## 2023-11-23 MED ORDER — TRAZODONE HCL 50 MG PO TABS: 50.0000 mg | ORAL_TABLET | Freq: Every evening | ORAL | Status: DC | PRN

## 2023-11-23 MED ORDER — POLYETHYLENE GLYCOL 3350 17 G PO PACK: 17.0000 g | PACK | Freq: Every day | ORAL | Status: DC | PRN

## 2023-11-23 MED ORDER — BISACODYL 10 MG RE SUPP: 10.0000 mg | Freq: Every day | RECTAL | Status: DC | PRN

## 2023-11-23 MED ORDER — ACETAMINOPHEN 650 MG RE SUPP: 650.0000 mg | Freq: Four times a day (QID) | RECTAL | Status: DC | PRN

## 2023-11-23 MED ORDER — SODIUM CHLORIDE 0.9% FLUSH
3.0000 mL | Freq: Two times a day (BID) | INTRAVENOUS | Status: DC
  Administered 2023-11-23 – 2023-11-24 (×3): 3 mL via INTRAVENOUS

## 2023-11-23 MED ORDER — ATORVASTATIN CALCIUM 40 MG PO TABS
40.0000 mg | ORAL_TABLET | Freq: Every day | ORAL | Status: DC
  Administered 2023-11-23 – 2023-11-24 (×2): 40 mg via ORAL
  Filled 2023-11-23 (×2): qty 1

## 2023-11-23 MED ORDER — ONDANSETRON HCL 4 MG/2ML IJ SOLN: 4.0000 mg | Freq: Four times a day (QID) | INTRAMUSCULAR | Status: DC | PRN

## 2023-11-23 MED ORDER — HEPARIN SODIUM (PORCINE) 5000 UNIT/ML IJ SOLN
5000.0000 [IU] | Freq: Three times a day (TID) | INTRAMUSCULAR | Status: DC
  Administered 2023-11-23 – 2023-11-24 (×2): 5000 [IU] via SUBCUTANEOUS
  Filled 2023-11-23 (×2): qty 1

## 2023-11-23 MED ORDER — IOHEXOL 350 MG/ML SOLN
75.0000 mL | Freq: Once | INTRAVENOUS | Status: AC | PRN
  Administered 2023-11-23: 75 mL via INTRAVENOUS

## 2023-11-23 MED ORDER — ACETAMINOPHEN 325 MG PO TABS: 650.0000 mg | ORAL_TABLET | Freq: Four times a day (QID) | ORAL | Status: DC | PRN

## 2023-11-23 NOTE — ED Provider Notes (Signed)
 [place] EMERGENCY DEPARTMENT AT [facility] Provider Note   CSN: [id number] Arrival date & time: 11/23/23  1056     History  Chief Complaint  Patient presents with   Altered Mental Status    Nicole Travis is a 43 y.o. female.  HPI    43 year old female comes in with chief complaint of altered mental status.  Patient has previous history of migraine, stroke and hypertension.  Patient is also uncontrolled hypertensive patient as she has not been taking his medications because of lack of PCP/insurance.  Patient brought here by daughter.  According to the daughter, patient last known normal at 7 AM on Thursday, 3 days ago.  She saw patient at 1 PM on Thursday -and patient was acting forgetful at that time.  The daughter saw her mother again today, and she was very forgetful and complaining of some vision disturbance, therefore she brought her into the ER.  When the daughter spoke with patient's husband, her stepfather -he mentioned that patient has been forgetful since Friday.  Patient currently complains of some vision disturbance.   Home Medications Prior to Admission medications   Medication Sig Start Date End Date Taking? Authorizing Provider  ibuprofen  (ADVIL ,MOTRIN ) 200 MG tablet Take 800 mg by mouth every 6 (six) hours as needed for mild pain.    [provider]  lisinopril  (PRINIVIL ,ZESTRIL ) 10 MG tablet Take 1 tablet (10 mg total) by mouth daily. 05/12/18   Jerilynn Montenegro, MD  methylPREDNISolone  (MEDROL  DOSEPAK) 4 MG TBPK tablet Use as directed 04/28/21   Harris, Abigail, PA-C      Allergies    Celexa  [citalopram  hydrobromide]    Review of Systems   Review of Systems  All other systems reviewed and are negative.   Physical Exam Updated Vital Signs BP (!) 161/98 (BP Location: Right Arm)   Pulse 78   Temp 98.4 F (36.9 C) (Oral)   Resp 18   Ht 5\' 1"  (1.549 m)   Wt 124.7 kg   SpO2 95%   BMI 51.94 kg/m  Physical Exam Vitals and nursing note  reviewed.  Constitutional:      Appearance: She is well-developed.  HENT:     Head: Atraumatic.  Cardiovascular:     Rate and Rhythm: Normal rate.  Pulmonary:     Effort: Pulmonary effort is normal.  Musculoskeletal:     Cervical back: Normal range of motion and neck supple.  Skin:    General: Skin is warm and dry.  Neurological:     Mental Status: She is alert and oriented to person, place, and time.     Comments: Patient has peripheral visual field deficit on the right lateral side, upper and lower. She reported some visual disturbance with the right sided gaze No drooping, upper and lower extremity strength is 4 out of 5 and no gross sensory deficits.  No slurred speech.  Oriented x 3, but unable to recall president.  NIH stroke scale 2, mild hemianopsia and mild right-sided gaze abnormality that patient is able to overcome     ED Results / Procedures / Treatments   Labs (all labs ordered are listed, but only abnormal results are displayed) Labs Reviewed  CBC - Abnormal; Notable for the following components:      Result Value   RBC 5.42 (*)    MCH 25.6 (*)    All other components within normal limits  DIFFERENTIAL - Abnormal; Notable for the following components:   Abs Immature Granulocytes 0.09 (*)  All other components within normal limits  COMPREHENSIVE METABOLIC PANEL WITH GFR - Abnormal; Notable for the following components:   Glucose, Bld 112 (*)    Total Protein 8.9 (*)    All other components within normal limits  CBG MONITORING, ED - Abnormal; Notable for the following components:   Glucose-Capillary 117 (*)    All other components within normal limits  I-STAT CHEM 8, ED - Abnormal; Notable for the following components:   Glucose, Bld 115 (*)    Hemoglobin 15.6 (*)    All other components within normal limits  PROTIME-INR  APTT  ETHANOL  RAPID URINE DRUG SCREEN, HOSP PERFORMED  POC URINE PREG, ED    EKG EKG Interpretation Date/Time:  Sunday Nov 23 2023 11:15:50 EDT Ventricular Rate:  79 PR Interval:  160 QRS Duration:  97 QT Interval:  382 QTC Calculation: 438 R Axis:   47  Text Interpretation: Sinus rhythm No acute changes No significant change since last tracing Confirmed by Deatra Face ([id number]) on 11/23/2023 11:54:44 AM  Radiology MR BRAIN W WO CONTRAST Result Date: 11/23/2023 CLINICAL DATA:  43 year old female with headache, altered mental status. Abnormal hypodensity on head CT today. 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 EXAM: MRI HEAD WITHOUT AND WITH CONTRAST TECHNIQUE: Multiplanar, multiecho pulse sequences of the brain and surrounding structures were obtained without and with intravenous contrast. CONTRAST:  10mL GADAVIST GADOBUTROL 1 MMOL/ML IV SOLN COMPARISON:  Head CT 1137 hours today. Brain MRI 10/18/2014 (only some images of that exam are available). FINDINGS: Brain: Confluent restricted diffusion in the left hemisphere from the posterior periventricular white matter tracking caudal through the dorsal left thalamus and into the mesial left temporal lobe. See series 5, images 33 through 23. This is directly affecting the left hippocampal formation. Associated patchy and indistinct T2 and FLAIR hyperintensity which is mild. No hemorrhage or mass effect there. Superimposed well-developed cystic encephalomalacia in the more central left thalamus most resembles a chronic lacunar infarct (series 9, image 14). Superimposed appearance of chronic lacunar infarct also in the anterior left corona radiata tracking into the left basal ganglia. And patchy cortical encephalomalacia along the right parieto-occipital sulcus. Furthermore, confluent abnormal T2 and FLAIR hyperintensity in the splenium of the corpus callosum and into the periatrial white matter bilaterally (series 10, image 27) with no expansion or volume loss of the splenium. Mild patchy T2 hyperintensity in the right corona radiata and lentiform. No other restricted  diffusion identified. No other cortical encephalomalacia. No abnormal enhancement identified. No midline shift, mass effect, evidence of mass lesion, ventriculomegaly, extra-axial collection or acute intracranial hemorrhage. Cervicomedullary junction and pituitary are within normal limits. No dural thickening. Vascular: Major intracranial vascular flow voids are preserved. Following contrast the major dural venous sinuses are enhancing and appear to be patent. Skull and upper cervical spine: Negative visible cervical spine and spinal cord. Visualized bone marrow signal is within normal limits. Sinuses/Orbits: Rightward gaze, otherwise negative. Paranasal Visualized paranasal sinuses and mastoids are stable and well aerated. Other: Visible internal auditory structures appear normal. Negative visible scalp and face. IMPRESSION: 1. Confirmed acute abnormality - cytotoxic edema in the left hemisphere tracking from the posterior left thalamus through the left hippocampus and mesial temporal lobe. No associated hemorrhage or enhancement. No intracranial mass effect. 2. Nearby chronic appearing white matter signal changes including the Splenium, such that Demyelinating disease was considered. However, multiple other chronic areas most resembling small-vessel infarcts, including elsewhere in the left thalamus, anterior left corona radiata,  and right occipital cortex. And in conjunction with MRA report of intracranial atherosclerosis in 2016, strongly favor acute on chronic ischemic disease. Electronically Signed   By: Marlise Simpers M.D.   On: 11/23/2023 12:35   CT HEAD WO CONTRAST Result Date: 11/23/2023 CLINICAL DATA:  Headache.  Forgetfulness.  Mental status changes. EXAM: CT HEAD WITHOUT CONTRAST TECHNIQUE: Contiguous axial images were obtained from the base of the skull through the vertex without intravenous contrast. RADIATION DOSE REDUCTION: This exam was performed according to the departmental dose-optimization program  which includes automated exposure control, adjustment of the mA and/or kV according to patient size and/or use of iterative reconstruction technique. COMPARISON:  10/18/2014 FINDINGS: Brain: No evidence for acute hemorrhage, hydrocephalus, or abnormal extra-axial fluid collection. Small foci of hypodensity is seen in the posterior right occipital lobe on image 19 of series 2 and coronal 69/4 as well as in the inferior right occipital lobe on axial 15/2 and coronal 64/4. Abnormal hypodensity is seen in the medial and inferior left temporal lobe including axial images 13-15 of series 2 and coronal image 50 of series 4. Focal hypodensity noted in the deep white matter of the posterior left frontal region on axial 20/2. These changes all appear new in the interval. Vascular: No hyperdense vessel or unexpected calcification. Skull: No evidence for fracture. No worrisome lytic or sclerotic lesion. Sinuses/Orbits: The visualized paranasal sinuses and mastoid air cells are clear. Visualized portions of the globes and intraorbital fat are unremarkable. Other: None. IMPRESSION: Multiple foci of hypodensity in the right occipital lobe, left temporal lobe, and deep white matter of the posterior left frontal region. These changes all appear new in the interval. Acute to subacute ischemia could have this appearance in multifocal presentation would raise the question of embolic etiology. Foci of vasogenic edema from multiple occult brain lesions would also be a consideration. MRI of the brain with and without contrast recommended to further evaluate. Critical Value/emergent results were called by telephone at the time of interpretation on 11/23/2023 at 11:53 am to provider [facility] , who verbally acknowledged these results. Electronically Signed   By: Donnal Fusi M.D.   On: 11/23/2023 11:54    Procedures Procedures    Medications Ordered in ED Medications  aspirin  chewable tablet 324 mg (has no administration in  time range)  gadobutrol (GADAVIST) 1 MMOL/ML injection 10 mL (10 mLs Intravenous Contrast Given 11/23/23 1209)    ED Course/ Medical Decision Making/ A&P                                 Medical Decision Making Amount and/or Complexity of Data Reviewed Labs: ordered. Radiology: ordered.  Risk Prescription drug management. Decision regarding hospitalization.   This patient presents to the ED with chief complaint(s) of altered mental status, confusion, memory problems and some vision disturbance.  Daughter also indicates that patient is having balance issues with pertinent past medical history of hypertension that is uncontrolled, previous history of stroke.The complaint involves an extensive differential diagnosis and also carries with it a high risk of complications and morbidity.    The differential diagnosis includes : Acute ischemic stroke, acute hemorrhagic stroke, acute embolic stroke, encephalitis, severe electrolyte abnormality, hypertensive encephalopathy.  The initial plan is to get basic labs, CT scan of the brain. Patient outside of any stroke treatment window.   Additional history obtained: Additional history obtained from family  Independent labs interpretation:  The  following labs were independently interpreted: Initial workup shows no severe electrolyte abnormality.  Independent visualization and interpretation of imaging: - I independently visualized the following imaging with scope of interpretation limited to determining acute life threatening conditions related to emergency care: CT scan of the brain, which revealed no evidence of brain bleed.  However, I received a call from radiologist, they are concerned that patient could be having stroke.  There are multiple hypodensities.  They are requesting MRI brain with and without to make sure this was not a metastatic disease process.  Treatment and Reassessment: Results of the ED workup discussed with the patient.   She has not had any changes to her symptoms. She is made aware of the MRI findings, she is aware that she will be admitted for stroke workup.  Consultation: - Consulted or discussed management/test interpretation with external professional: Discussed case with Dr. Cleone Dad, neurologist.  If patient's symptoms are stable, then she can stay at [facility] and get rounded on by neurology tomorrow.   Social Determinants of health: Lack of insurance   Final Clinical Impression(s) / ED Diagnoses Final diagnoses:  Acute stroke due to ischemia [facility])    Rx / DC Orders ED Discharge Orders     None         Deatra Face, MD 11/23/23 1425

## 2023-11-23 NOTE — ED Triage Notes (Signed)
 Pt BIB daughter for "forgetfulness." During stroke screening pt could not tell this RN her age or who the president is.

## 2023-11-23 NOTE — ED Notes (Signed)
 Pt ambulated to bathroom with daughter, UA obtained

## 2023-11-23 NOTE — H&P (Signed)
 History and Physical    Patient: Nicole Travis MVH:[id number] DOB: [birth year] DOA: 11/23/2023 DOS: the patient was seen and examined on 11/23/2023 PCP: Patient, No Pcp Per  Patient coming from: Home  Chief Complaint:  Chief Complaint  Patient presents with   Altered Mental Status   HPI: Nicole Travis is a 43 y.o. 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  Last known well more than 72 hours ago -Additional history obtained from patient's husband, [name] and patient's daughter [place] as well as [name] at bedside -- Apparently patient has not been taking any of her medications since her stroke in 49 - EDP discussed case with on-call neurologist Dr. Victoriano Grate noted In ED CTA head and neck--IMPRESSION: 1. High-grade stenosis or occlusion of the proximal right posterior cerebral artery with some collateralization of distal vessels. 2. Moderate irregular stenosis throughout the basilar artery. 3. Moderate stenosis in the distal left P2 segment. 4. No other significant proximal stenosis, aneurysm, or branch vessel occlusion within the anterior circulation. 5. No significant stenosis in the neck. - MRI brain--  IMPRESSION: 1. Confirmed acute abnormality - cytotoxic edema in the left hemisphere tracking from the posterior left thalamus through the left hippocampus and mesial temporal lobe. No associated hemorrhage or enhancement. No intracranial mass effect.   2. Nearby chronic appearing white matter signal changes including the Splenium, such that Demyelinating disease was considered. However, multiple other chronic areas most resembling small-vessel infarcts, including elsewhere in the left thalamus, anterior  left corona radiata, and right occipital cortex. And in conjunction with MRA report of intracranial atherosclerosis in 2016, strongly favor acute on chronic ischemic disease  CT Head without contrast-Multiple foci of hypodensity in the right occipital lobe, left temporal lobe, and deep white matter of the posterior left frontal region. These changes all appear new in the interval. Acute to subacute ischemia could have this appearance in multifocal presentation would raise the question of embolic etiology. Foci of vasogenic edema from multiple occult brain lesions would also be a Consideration  - EKG sinus and nonacute - TSH WNL - LDL 104, HDL 52, triglycerides 51, total cholesterol 166 - UDS negative, LFTs not elevated creatinine 0.70 potassium 3.6 sodium 136 chloride 25 glucose 112 - CBC essentially normal  Review of Systems: As mentioned in the history of present illness. All other systems reviewed and are negative. Past Medical History:  Diagnosis Date   Acute confusional migraine    DUB (dysfunctional uterine bleeding)    Headache    Hemisensory deficit    right   Hypertension    Stroke [facility])    Thalamic pain syndrome    Past Surgical History:  Procedure Laterality Date   CESAREAN SECTION     TUBAL LIGATION     Social History:  reports that she has never smoked. She has never used smokeless tobacco. She reports that she does not drink alcohol and does not use drugs.  Allergies  Allergen Reactions   Celexa  [Citalopram  Hydrobromide] Anxiety    Family History  Problem Relation Age of Onset   Cervical cancer Mother    Stroke Maternal Grandmother    Heart attack Maternal Grandmother    Heart attack Brother    Breast cancer Sister    Breast cancer Sister    Breast cancer Sister  Prior to Admission medications   Medication Sig Start Date End Date Taking? Authorizing Provider  ibuprofen  (ADVIL ,MOTRIN ) 200 MG tablet Take 800 mg by mouth every 6 (six) hours as  needed for mild pain.   Yes [provider]    Physical Exam: Vitals:   11/23/23 1308 11/23/23 1430 11/23/23 1530 11/23/23 1554  BP: (!) 161/98 (!) 211/97 (!) 176/84 (!) 164/76  Pulse: 78 88 87 85  Resp: 18   16  Temp: 98.4 F (36.9 C)     TempSrc: Oral     SpO2: 95% 92% 94% 93%  Weight:      Height:        Physical Exam  Gen:- Awake Alert, in no acute distress , morbidly obese HEENT:- [place].AT, No sclera icterus, right-sided peripheral vision loss Neck-Supple Neck,No JVD,.  Lungs-  CTAB , fair air movement bilaterally  CV- S1, S2 normal, RRR Abd-  +ve B.Sounds, Abd Soft, No tenderness, increased truncal adiposity Extremity/Skin:- No  edema,   good pedal pulses  Psych-forgetfulness, disorientation, poor recall of memory  neuro-right eye peripheral vision loss as noted above, no significant additional new focal deficits, no tremors  Data Reviewed: EDP discussed case with on-call neurologist Dr. Bhagat--recommendations noted In ED CTA head and neck--IMPRESSION: 1. High-grade stenosis or occlusion of the proximal right posterior cerebral artery with some collateralization of distal vessels. 2. Moderate irregular stenosis throughout the basilar artery. 3. Moderate stenosis in the distal left P2 segment. 4. No other significant proximal stenosis, aneurysm, or branch vessel occlusion within the anterior circulation. 5. No significant stenosis in the neck. - MRI brain--  IMPRESSION: 1. Confirmed acute abnormality - cytotoxic edema in the left hemisphere tracking from the posterior left thalamus through the left hippocampus and mesial temporal lobe. No associated hemorrhage or enhancement. No intracranial mass effect.   2. Nearby chronic appearing white matter signal changes including the Splenium, such that Demyelinating disease was considered. However, multiple other chronic areas most resembling small-vessel infarcts, including elsewhere in the left thalamus,  anterior left corona radiata, and right occipital cortex. And in conjunction with MRA report of intracranial atherosclerosis in 2016, strongly favor acute on chronic ischemic disease  CT Head without contrast-Multiple foci of hypodensity in the right occipital lobe, left temporal lobe, and deep white matter of the posterior left frontal region. These changes all appear new in the interval. Acute to subacute ischemia could have this appearance in multifocal presentation would raise the question of embolic etiology. Foci of vasogenic edema from multiple occult brain lesions would also be a Consideration  - EKG sinus and nonacute - TSH WNL - LDL 104, HDL 52, triglycerides 51, total cholesterol 166 - UDS negative, LFTs not elevated creatinine 0.70 potassium 3.6 sodium 136 chloride 25 glucose 112 - CBC essentially normal  Assessment and Plan: 1)Acute CVA - place on telemetry monitored unit, -Get echocardiogram to rule out intracardiac thrombus  and to evaluate EF is pending,  Official neurology consult pending.  - PT/OT eval pending, speech eval pending,  We will allow some permissive hypertension in view of possible acute stroke, avoid precipitous drop in blood pressure. Use Hydralazine 10mg  or Labetalol 10 mg iv every 4 hrs as needed for systolic blood pressure over 185 mmhg ,   TSH WNL,  A1c-pending   - LDL 104, HDL 52, triglycerides 51, total cholesterol 166 CTA head with significant intracranial stenoses and occlusions as noted above - MRI brain with acute stroke as noted above -#Please maintain euthermia.  Tylenol  prn for temp >100.4  # Frequent neuro checks - Despite prior stroke in 2015 patient was not on any antiplatelet agent or statins -Start  Aspirin  81 mg daily along with Plavix 75 mg daily for 90 days then after that STOP the Plavix  and continue ONLY Aspirin  81 mg daily indefinitely--for secondary stroke Prevention (Per The multicenter SAMMPRIS trial) - Consider event  monitor/Holter/Zio patch  2)Morbid Obesity- -Low calorie diet, portion control and increase physical activity discussed with patient -Body mass index is 51.94 kg/m.  3)HTN--- allow some permissive hypertension - Okay to start amlodipine  5 mg in a.m. - Use IV labetalol with parameters at this time     Advance Care Planning:   Code Status: Full Code   Consults: Neurology requested  Family Communication: Discussed with patient's husband, [name], patient's daughter , patient daughter-in-law Cecily Cohen and patient's daughter [name] at bedside  Severity of Illness: The appropriate patient status for this patient is OBSERVATION. Observation status is judged to be reasonable and necessary in order to provide the required intensity of service to ensure the patient's safety. The patient's presenting symptoms, physical exam findings, and initial radiographic and laboratory data in the context of their medical condition is felt to place them at decreased risk for further clinical deterioration. Furthermore, it is anticipated that the patient will be medically stable for discharge from the hospital within 2 midnights of admission.   Author: Colin Dawley, MD 11/23/2023 6:17 PM  For on call review [email or web address].

## 2023-11-23 NOTE — Plan of Care (Signed)

## 2023-11-24 ENCOUNTER — Other Ambulatory Visit: Payer: Self-pay

## 2023-11-24 ENCOUNTER — Observation Stay (HOSPITAL_COMMUNITY): Payer: MEDICAID

## 2023-11-24 ENCOUNTER — Other Ambulatory Visit (HOSPITAL_COMMUNITY): Payer: Self-pay

## 2023-11-24 ENCOUNTER — Observation Stay (HOSPITAL_BASED_OUTPATIENT_CLINIC_OR_DEPARTMENT_OTHER): Payer: MEDICAID

## 2023-11-24 DIAGNOSIS — I639 Cerebral infarction, unspecified: Secondary | ICD-10-CM | POA: Diagnosis not present

## 2023-11-24 DIAGNOSIS — I6300 Cerebral infarction due to thrombosis of unspecified precerebral artery: Secondary | ICD-10-CM

## 2023-11-24 DIAGNOSIS — Z6841 Body Mass Index (BMI) 40.0 and over, adult: Secondary | ICD-10-CM

## 2023-11-24 DIAGNOSIS — I6389 Other cerebral infarction: Secondary | ICD-10-CM | POA: Diagnosis not present

## 2023-11-24 DIAGNOSIS — I10 Essential (primary) hypertension: Secondary | ICD-10-CM | POA: Diagnosis not present

## 2023-11-24 DIAGNOSIS — R29700 NIHSS score 0: Secondary | ICD-10-CM | POA: Diagnosis not present

## 2023-11-24 LAB — ANTITHROMBIN III: AntiThromb III Func: 117 % (ref 75–120)

## 2023-11-24 LAB — ECHOCARDIOGRAM COMPLETE
Area-P 1/2: 3.85 cm2
Height: 61 in
S' Lateral: 2.5 cm
Weight: 4398.62 [oz_av]

## 2023-11-24 LAB — GLUCOSE, CAPILLARY
Glucose-Capillary: 125 mg/dL — ABNORMAL HIGH (ref 70–99)
Glucose-Capillary: 134 mg/dL — ABNORMAL HIGH (ref 70–99)

## 2023-11-24 MED ORDER — IOHEXOL 300 MG/ML  SOLN
100.0000 mL | Freq: Once | INTRAMUSCULAR | Status: AC | PRN
  Administered 2023-11-24: 100 mL via INTRAVENOUS

## 2023-11-24 MED ORDER — ASPIRIN 81 MG PO TBEC: 81.0000 mg | DELAYED_RELEASE_TABLET | Freq: Every day | ORAL | 3 refills | Status: AC

## 2023-11-24 MED ORDER — CLOPIDOGREL BISULFATE 75 MG PO TABS: 75.0000 mg | ORAL_TABLET | Freq: Every day | ORAL | 0 refills | Status: DC

## 2023-11-24 MED ORDER — AMLODIPINE BESYLATE 10 MG PO TABS: 10.0000 mg | ORAL_TABLET | Freq: Every day | ORAL | 3 refills | Status: DC

## 2023-11-24 MED ORDER — PERFLUTREN LIPID MICROSPHERE
1.0000 mL | INTRAVENOUS | Status: AC | PRN
  Administered 2023-11-24: 3 mL via INTRAVENOUS

## 2023-11-24 MED ORDER — ATORVASTATIN CALCIUM 40 MG PO TABS: 40.0000 mg | ORAL_TABLET | Freq: Every day | ORAL | 3 refills | Status: DC

## 2023-11-24 MED ORDER — IOHEXOL 9 MG/ML PO SOLN
ORAL | Status: AC
  Filled 2023-11-24: qty 1000

## 2023-11-24 NOTE — Evaluation (Signed)
 Physical Therapy Evaluation Patient Details Name: Nicole Travis MRN: [id number] DOB: [birth year] Today's Date: 11/24/2023  History of Present Illness  Nicole Travis is a 43 y.o. 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  Last known well more than 72 hours ago  -Additional history obtained from patient's husband, [name] and patient's daughter [place] as well as [name] at bedside   Clinical Impression  Patient functioning nea baseline for functional mobility and gait other than occasional drifting to the right mostly due to decreased right visual field and c/o locking of left knee with pain during ambulation, otherwise patient had no loss of balance with fair/good return for ambulating in room/hallway without need for an AD.  Plan:  Patient discharged from physical therapy to care of nursing for ambulation daily as tolerated for length of stay.          If plan is discharge home, recommend the following: A little help with walking and/or transfers;Help with stairs or ramp for entrance;Assistance with cooking/housework;A little help with bathing/dressing/bathroom   Can travel by private vehicle        Equipment Recommendations None recommended by PT  Recommendations for Other Services       Functional Status Assessment Patient has had a recent decline in their functional status and demonstrates the ability to make significant improvements in function in a reasonable and predictable amount of time.     Precautions / Restrictions Precautions Precautions: Fall Recall of Precautions/Restrictions: Intact Restrictions Weight Bearing Restrictions Per Provider Order: No      Mobility  Bed Mobility Overal bed mobility: Independent                   Transfers Overall transfer level: Independent                      Ambulation/Gait Ambulation/Gait assistance: Modified independent (Device/Increase time) Gait Distance (Feet): 100 Feet Assistive device: None Gait Pattern/deviations: WFL(Within Functional Limits), Drifts right/left Gait velocity: decreased     General Gait Details: grossly WFL with occasional veering to the right mostly due to decreased visual field on the right, able to self correct without loss of balance, patient also c/o locking of left knee with increasing pain  Stairs            Wheelchair Mobility     Tilt Bed    Modified Rankin (Stroke Patients Only)       Balance Overall balance assessment: Mild deficits observed, not formally tested                                           Pertinent Vitals/Pain Pain Assessment Pain Assessment: Faces Faces Pain Scale: Hurts little more Pain Location: L knee during ambulation. 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 ADLs Comments: Independent     Extremity/Trunk Assessment   Upper Extremity Assessment Upper Extremity Assessment: Defer to OT evaluation    Lower Extremity Assessment Lower Extremity Assessment: Overall WFL for tasks assessed;LLE deficits/detail LLE Deficits / Details: 4/5 LLE: Unable to fully assess due to pain LLE  Sensation: WNL LLE Coordination: WNL    Cervical / Trunk Assessment Cervical / Trunk Assessment: Normal  Communication   Communication Communication: No apparent difficulties    Cognition Arousal: Alert Behavior During Therapy: WFL for tasks assessed/performed   PT - Cognitive impairments: No apparent impairments                         Following commands: Intact       Cueing Cueing Techniques: Verbal cues     General Comments      Exercises     Assessment/Plan    PT Assessment All further PT needs can be met in the next venue of care  PT Problem List Decreased mobility;Decreased balance;Decreased activity tolerance;Decreased strength;Pain       PT Treatment Interventions      PT Goals (Current goals can be found in the Care Plan section)  Acute Rehab PT Goals Patient Stated Goal: return home with family to assist PT Goal Formulation: With patient Time For Goal Achievement: 11/24/23 Potential to Achieve Goals: Good    Frequency       Co-evaluation PT/OT/SLP Co-Evaluation/Treatment: Yes Reason for Co-Treatment: To address functional/ADL transfers PT goals addressed during session: Mobility/safety with mobility;Balance OT goals addressed during session: ADL's and self-care       AM-PAC PT "6 Clicks" Mobility  Outcome Measure Help needed turning from your back to your side while in a flat bed without using bedrails?: None Help needed moving from lying on your back to sitting on the side of a flat bed without using bedrails?: None Help needed moving to and from a bed to a chair (including a wheelchair)?: None Help needed standing up from a chair using your arms (e.g., wheelchair or bedside chair)?: None Help needed to walk in hospital room?: A Little Help needed climbing 3-5 steps with a railing? : A Little 6 Click Score: 22    End of Session   Activity Tolerance: Patient tolerated treatment well Patient left: in bed;with call bell/phone  within reach Nurse Communication: Mobility status PT Visit Diagnosis: Unsteadiness on feet (R26.81);Other abnormalities of gait and mobility (R26.89);Muscle weakness (generalized) (M62.81)    Time: 2956-2130 PT Time Calculation (min) (ACUTE ONLY): 30 min   Charges:   PT Evaluation $PT Eval Moderate Complexity: 1 Mod PT Treatments $Therapeutic Activity: 23-37 mins PT General Charges $$ ACUTE PT VISIT: 1 Visit         12:07 PM, 11/24/23 Walton Guppy, MPT Physical Therapist with [facility] [phone] office [phone] mobile phone

## 2023-11-24 NOTE — TOC CM/SW Note (Addendum)
 Transition of Care [facility]) - Inpatient Brief Assessment   Patient Details  Name: Nicole Travis MRN: [id number] Date of Birth: July 06, 1981  Transition of Care [facility]) CM/SW Contact:    Grandville Lax, LCSWA Phone Number: 11/24/2023, 8:50 AM   Clinical Narrative: CSW spoke with pt at bedside, pt confirms she does not have insurance and is agreeable to referral to financial counselor. CSW made referral, they will follow up. CSW provided pt with free clinic pamphlet so that pt can review and receive primary care services. PT is recommending follow up services for pt. CSW spoke with pts son who is at bedside to review OP PT referral. He states they are agreeable and would like to speak with them about the services. CSW to make referral at this time.   Transition of Care Department [facility]) has reviewed patient and no TOC needs have been identified at this time. We will continue to monitor patient advancement through interdiciplinary progression rounds. If new patient transition needs arise, please place a TOC consult.   Transition of Care Asessment: Insurance: Self-pay/Lapsed Patient has primary care physician: No (PCP list added to AVS) Home environment has been reviewed: From home Prior level of function:: Independent Prior/Current Home Services: No current home services Social Drivers of Health Review: SDOH reviewed no interventions necessary Readmission risk has been reviewed: Yes Transition of care needs: no transition of care needs at this time

## 2023-11-24 NOTE — Plan of Care (Signed)
   Problem: Education: Goal: Knowledge of General Education information will improve Description: Including pain rating scale, medication(s)/side effects and non-pharmacologic comfort measures Outcome: Progressing   Problem: Clinical Measurements: Goal: Ability to maintain clinical measurements within normal limits will improve Outcome: Progressing Goal: Diagnostic test results will improve Outcome: Progressing

## 2023-11-24 NOTE — Consult Note (Signed)
 I connected with  Nicole Travis on 11/24/23 by a video enabled telemedicine application and verified that I am speaking with the correct person using two identifiers.   I discussed the limitations of evaluation and management by telemedicine. The patient expressed understanding and agreed to proceed.  Location of patient: [facility] Location of physician: [facility]   Neurology Consultation Reason for Consult: stroke Referring Physician: Dr. Colin Dawley  CC: confusional episodes, right eye vision deficits and memory deficits.   History is obtained from: Patient, chart review  HPI: Nicole Travis is a 43 y.o. female with past medical history of hypertension, hyperlipidemia, migraine who presented with episodes of confusion, right eye visual deficit as well as memory deficits.  Patient states she does not know what is going on.  Per son at bedside, his sister who stays with patient's mom called him around 10 AM yesterday morning stating patient has been confused since morning.  Reportedly she was normal on Saturday night when she went to bed but unclear what time that was.  Per son, he just got married and she recently lost her husband but does not remember any of this.  Last known normal: 11/22/2023 before bedtime (unclear what time that was) Event happened at home No tPA as outside window No thrombectomy as no large vessel occlusion mRS 0    ROS: All other systems reviewed and negative except as noted in the HPI.   Past Medical History:  Diagnosis Date   Acute confusional migraine    DUB (dysfunctional uterine bleeding)    Headache    Hemisensory deficit    right   Hypertension    Stroke [facility])    Thalamic pain syndrome     Family History  Problem Relation Age of Onset   Cervical cancer Mother    Stroke Maternal Grandmother    Heart attack Maternal Grandmother    Heart attack Brother    Breast cancer Sister    Breast cancer Sister    Breast cancer Sister      Social History:  reports that she has never smoked. She has never used smokeless tobacco. She reports that she does not drink alcohol and does not use drugs.   Medications Prior to Admission  Medication Sig Dispense Refill Last Dose/Taking   ibuprofen  (ADVIL ,MOTRIN ) 200 MG tablet Take 800 mg by mouth every 6 (six) hours as needed for mild pain.   Past Month      Exam: Current vital signs: BP (!) 176/82 Comment: retake of bp not meeting parameters for BP.  Pulse 70   Temp (!) 97.5 F (36.4 C) (Oral)   Resp 17   Ht 5\' 1"  (1.549 m)   Wt 124.7 kg   SpO2 96%   BMI 51.94 kg/m  Vital signs in last 24 hours: Temp:  [97.5 F (36.4 C)-98.7 F (37.1 C)] 97.5 F (36.4 C) (05/05 0450) Pulse Rate:  [69-92] 70 (05/05 0535) Resp:  [16-21] 17 (05/05 0450) BP: (161-215)/(76-115) 176/82 (05/05 0535) SpO2:  [91 %-97 %] 96 % (05/05 0535) FiO2 (%):  [21 %] 21 % (05/04 2315) Weight:  [124.7 kg] 124.7 kg (05/04 1113)   Physical Exam  Constitutional: Appears well-developed and well-nourished.  Neuro: AO x 3, follows commands, has tape over her right eye but otherwise rest of the cranial nerves appear grossly intact, antigravity strength without drift in all 4 extremities, sensory intact to light touch, FTN intact bilaterally  NIHSS 0  I have reviewed labs  in epic and the results pertinent to this consultation are: CBC:  Recent Labs  Lab 11/23/23 1119 11/23/23 1120  WBC  --  9.7  NEUTROABS  --  7.1  HGB 15.6* 13.9  HCT 46.0 45.0  MCV  --  83.0  PLT  --  371    Basic Metabolic Panel:  Lab Results  Component Value Date   NA 136 11/23/2023   K 3.6 11/23/2023   CO2 25 11/23/2023   GLUCOSE 112 (H) 11/23/2023   BUN 11 11/23/2023   CREATININE 0.70 11/23/2023   CALCIUM  9.1 11/23/2023   GFRNONAA >60 11/23/2023   GFRAA >60 09/22/2017   Lipid Panel:  Lab Results  Component Value Date   LDLCALC 104 (H) 11/23/2023   HgbA1c:  Lab Results  Component Value Date   HGBA1C 5.2  11/23/2023   Urine Drug Screen:     Component Value Date/Time   LABOPIA NONE DETECTED 11/23/2023 1217   COCAINSCRNUR NONE DETECTED 11/23/2023 1217   LABBENZ NONE DETECTED 11/23/2023 1217   AMPHETMU NONE DETECTED 11/23/2023 1217   THCU NONE DETECTED 11/23/2023 1217   LABBARB NONE DETECTED 11/23/2023 1217    Alcohol Level     Component Value Date/Time   ETH <15 11/23/2023 1120     I have reviewed the images obtained:   CT Head without contrast 11/23/2023: Multiple foci of hypodensity in the right occipital lobe, left temporal lobe, and deep white matter of the posterior left frontal region. These changes all appear new in the interval. Acute to subacute ischemia could have this appearance in multifocal presentation would raise the question of embolic etiology. Foci of vasogenic edema from multiple occult brain lesions would also be a consideration. MRI of the brain with and without contrast recommended to further evaluate.   CT angio Head and Neck with contrast 11/23/2023:  High-grade stenosis or occlusion of the proximal right posterior cerebral artery with some collateralization of distal vessels. Moderate irregular stenosis throughout the basilar artery. Moderate stenosis in the distal left P2 segment. No other significant proximal stenosis, aneurysm, or branch vessel occlusion within the anterior circulation. No significant stenosis in the neck.   MRI Brain with and without contrast 11/23/2023 1. Confirmed acute abnormality - cytotoxic edema in the left hemisphere tracking from the posterior left thalamus through the left hippocampus and mesial temporal lobe. No associated hemorrhage or enhancement. No intracranial mass effect.   2. Nearby chronic appearing white matter signal changes including the Splenium, such that Demyelinating disease was considered. However, multiple other chronic areas most resembling small-vessel infarcts, including elsewhere in the left thalamus, anterior  left corona radiata, and right occipital cortex. And in conjunction with MRA report of intracranial atherosclerosis in 2016, strongly favor acute on chronic ischemic disease.    ASSESSMENT/PLAN: 43 year old female presented with sudden onset of confusion, memory deficits.  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  If negative, recommend 30-day cardiac monitor to look for paroxysmal A-fib - PT/OT - Goal blood pressure: Gradual normotension - Follow-up with Dr. Tresia Fruit at [facility] in 2 to 3 months - Discussed plan with patient son at bedside and Dr. Quintella Buck via secure chat   Thank you for allowing us  to participate in the care of this patient. If you have any further questions, please contact  me or neurohospitalist.   Roxy Cordial Epile[facility]

## 2023-11-24 NOTE — Progress Notes (Signed)
 In C.T.  per family member               Denese Finn, RCS

## 2023-11-24 NOTE — Discharge Instructions (Addendum)
 1)Take Aspirin  81 mg daily along with Plavix 75 mg daily for 90 days then after that STOP the Plavix and continue ONLY Aspirin  81 mg daily indefinitely--for secondary stroke Prevention.   2)Avoid ibuprofen /Advil /Aleve /Motrin /Goody Powders/Naproxen /BC powders/Meloxicam/Diclofenac/Indomethacin and other Nonsteroidal anti-inflammatory medications as these will make you more likely to bleed and can cause stomach ulcers, can also cause Kidney problems.   3)Please take all your medications as prescribed to Reduce your risk for another stroke  4) you will get a heart monitor to wear for about a month to look for irregular heartbeat like atrial fibrillation which can increase your risk for strokes  5)Morbid Obesity/class III obesity---Body mass index is 51.94 kg/m. -Low calorie diet, portion control and increase physical activity strongly advised-  Providers Accepting New Patients in [place], [place]  [facility] [address]  [place], [place] [address] [phone] Accepts most insurances  [facility] [address] [place], [place] [address] [phone] Accepts most insurances  [facility] [address] [place], [place] [address]  [phone] Must meet requirements  [facility] [address] [place], [place] [address] [phone] Accepts most insurances  [facility] [address]  [place], [place] [address] [phone] Accepts most insurances  [facility] [address]   [place], [place]   [phone] Accepts most insurances  [facility])  [phone]  [place], [place] [address] [phone] Accepts most insurances     [facility] [address]  [place], [place] [address] [phone] Accepts most insurances  [facility] [address] [place], [place] [address] [phone]  option 1 Accepts Medicaid and [facility] [address]  [place], [place] [address] [phone] Accepts most insurances  Clary Crown, MD [address] [place], [place] [address] [phone] Accepts most insurances  [facility] [address]  [place], [place] [address] [phone] Accepts most insurances  [facility] [phone] [address] [place], [place] [address] [phone] Accepts most insurances  [employer] [place] [address], [address] [place], [place] [address] [phone]  Accepts most insurances

## 2023-11-24 NOTE — Evaluation (Addendum)
 Occupational Therapy Evaluation Patient Details Name: Nicole Travis MRN: [id number] DOB: [birth year] Today's Date: 11/24/2023   History of Present Illness   Nicole Travis is a 43 y.o. 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  Last known well more than 72 hours ago  -Additional history obtained from patient's husband, patient's son Nicole Travis and patient's daughter Nicole Travis as well as Nicole Travis at bedside (per MD)     Clinical Impressions Pt agreeable to OT and PT co-evaluation. Pt's primary deficit appears to be R visual field cut and diplopia. Diplopia did improve with occlusion of R eye. Pt provided tape over the medial portion of her R lens. Pt educated on need for a full neuro visual specialist evaluation. Good  B UE strength. Mild gross motor difficulty with L UE, but very mild. Pt left in the chair with call bell within reach. Pt is not recommended for further acute OT services and will be discharged to care of nursing staff for remaining length of stay.      If plan is discharge home, recommend the following:   Help with stairs or ramp for entrance;Assist for transportation;Assistance with cooking/housework     Functional Status Assessment   Patient has had a recent decline in their functional status and demonstrates the ability to make significant improvements in function in a reasonable and predictable amount of time.     Equipment Recommendations   None recommended by OT              Precautions/Restrictions   Precautions Precautions: Fall Recall of Precautions/Restrictions: Intact Restrictions Weight Bearing Restrictions Per Provider Order: No     Mobility Bed Mobility Overal bed mobility: Independent                   Transfers Overall transfer level: Independent                 General transfer comment: Vision seems to be the primary limiting factor.      Balance Overall balance assessment: Mild deficits observed, not formally tested                                         ADL either performed or assessed with clinical judgement   ADL Overall ADL's : Needs assistance/impaired             Lower Body Bathing: Supervison/ safety;Modified independent;Sitting/lateral leans       Lower Body Dressing: Modified independent;Supervision/safety;Sitting/lateral leans Lower Body Dressing Details (indicate cue type and reason): Able to don socks without assist seated at EOB. Toilet Transfer: Ambulation;Independent Statistician Details (indicate cue type and reason): EOB to toilet without assist. Toileting- Clothing Manipulation and Hygiene: Independent       Functional mobility during ADLs: Modified independent;Supervision/safety General ADL Comments: Mildly unsteady ambulation with reports of visual deficits.     Vision Baseline Vision/History: 1 Wears glasses Ability to See in Adequate Light: 2 Moderately impaired Patient Visual Report: Blurring of vision;Diplopia Vision Assessment?: Yes Eye Alignment: Within Functional Limits Ocular Range of Motion: Within Functional Limits Tracking/Visual Pursuits: Able to track stimulus in all quads without difficulty Convergence: Within functional limits Visual Fields: Right visual field deficit Diplopia Assessment: Disappears  with one eye closed Additional Comments: Pt given tape over R eye medial portion of glasses which improved diplopia. Educated to try to rip some tape away piece by piece from lateral to medial and see if double vision remains improved.     Perception Perception: Not tested       Praxis Praxis: Not tested       Pertinent Vitals/Pain Pain Assessment Pain Assessment: Faces Faces Pain Scale:  Hurts little more Pain Location: L knee during ambulation. Pain Descriptors / Indicators: Guarding, Grimacing Pain Intervention(s): Monitored during session     Extremity/Trunk Assessment Upper Extremity Assessment Upper Extremity Assessment: Overall WFL for tasks assessed; mild  L UE gross motor difficulty for finger to nose test.    Lower Extremity Assessment Lower Extremity Assessment: Defer to PT evaluation   Cervical / Trunk Assessment Cervical / Trunk Assessment: Normal   Communication Communication Communication: No apparent difficulties   Cognition Arousal: Alert Behavior During Therapy: WFL for tasks assessed/performed Cognition: No family/caregiver present to determine baseline             OT - Cognition Comments: Pt struggling to recall some elements of home set up.                 Following commands: Intact       Cueing  General Comments   Cueing Techniques: Verbal cues                 Home Living Family/patient expects to be discharged to:: Private residence Living Arrangements: Spouse/significant other;Children Available Help at Discharge: Family;Available PRN/intermittently Type of Home: House Home Access: Other (comment) (Pt could not recall)     Home Layout: One level     Bathroom Shower/Tub: Other (comment) (Pt could not recall)   Bathroom Toilet: Standard Bathroom Accessibility: Yes How Accessible: Accessible via wheelchair;Accessible via walker Home Equipment: None          Prior Functioning/Environment Prior Level of Function : Independent/Modified Independent             Mobility Comments: Works; 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 ADLs Comments: Independent                            Co-evaluation PT/OT/SLP Co-Evaluation/Treatment: Yes Reason for Co-Treatment: To address functional/ADL transfers   OT goals addressed during session: ADL's and self-care      AM-PAC OT "6 Clicks" Daily Activity      Outcome Measure Help from another person eating meals?: None Help from another person taking care of personal grooming?: None Help from another person toileting, which includes using toliet, bedpan, or urinal?: None Help from another person bathing (including washing, rinsing, drying)?: None Help from another person to put on and taking off regular upper body clothing?: None Help from another person to put on and taking off regular lower body clothing?: None 6 Click Score: 24   End of Session    Activity Tolerance: Patient tolerated treatment well Patient left: in bed;with call bell/phone within reach  OT Visit Diagnosis: Other symptoms and signs involving the nervous system (J19.147)                Time: 8295-6213 OT Time Calculation (min): 24 min Charges:  OT General Charges $OT Visit: 1 Visit OT Evaluation $OT Eval Low Complexity: 1 Low  Schyler Butikofer OT, MOT  Thurnell Floss 11/24/2023, 9:39 AM

## 2023-11-24 NOTE — Plan of Care (Signed)

## 2023-11-24 NOTE — Progress Notes (Signed)
 SLP Cancellation Note  Patient Details Name: Nicole Travis MRN: [id number] DOB: 07/03/1981   Cancelled treatment:        Pt passed [facility] stroke swallowing screen; Per RN, Pt is tolerating D3/thin diet without incident. Will complete order for BSE. Please re-order if indicated. Thank you,  Antasia Haider H. Vergil Glasser, CCC-SLP Speech Language Pathologist    Florina Husbands 11/24/2023, 10:28 AM

## 2023-11-24 NOTE — Discharge Summary (Signed)
 Nicole Travis, is a 43 y.o. female  DOB [birth year]  MRN [id number].  Admission date:  11/23/2023  Admitting Physician  Colin Dawley, MD  Discharge Date:  11/24/2023   Primary MD  Patient, No Pcp Per  Recommendations for primary care physician for things to follow:  1)Take Aspirin  81 mg daily along with Plavix 75 mg daily for 90 days then after that STOP the Plavix and continue ONLY Aspirin  81 mg daily indefinitely--for secondary stroke Prevention.   2)Avoid ibuprofen /Advil /Aleve /Motrin /Goody Powders/Naproxen /BC powders/Meloxicam/Diclofenac/Indomethacin and other Nonsteroidal anti-inflammatory medications as these will make you more likely to bleed and can cause stomach ulcers, can also cause Kidney problems.   3)Please take all your medications as prescribed to Reduce your risk for another stroke  4) you will get a heart monitor to wear for about a month to look for irregular heartbeat like atrial fibrillation which can increase your risk for strokes  5)Morbid Obesity/class III obesity---Body mass index is 51.94 kg/m. -Low calorie diet, portion control and increase physical activity strongly advised-  Admission Diagnosis  Acute stroke due to ischemia [facility]) [I63.9]   Discharge Diagnosis  Acute stroke due to ischemia [facility]) [I63.9]    Principal Problem:   Acute Stroke (cerebrum) (HCC) Active Problems:   Morbid obesity with BMI of 50.0-59.9, adult (HCC)   HTN (hypertension)   Acute stroke due to ischemia [facility])      Past Medical History:  Diagnosis Date   Acute confusional migraine    DUB (dysfunctional uterine bleeding)    Headache    Hemisensory deficit    right   Hypertension    Stroke [facility])    Thalamic pain syndrome     Past Surgical History:  Procedure Laterality Date   CESAREAN SECTION     TUBAL LIGATION       HPI  from the history and physical done on the day of admission:  HPI: Nicole Travis is a 43 y.o. 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  Last known well more than 72 hours ago -Additional history obtained from patient's husband, patient's son Nicole Travis and patient's daughter Nicole Travis as well as Nicole Travis at bedside -- Apparently patient has not been taking any of her medications since her stroke in 18 - EDP discussed case with on-call neurologist Dr. Victoriano Grate noted In ED CTA head and neck--IMPRESSION: 1. High-grade stenosis or occlusion of the proximal right posterior cerebral artery with some collateralization of distal vessels. 2. Moderate irregular stenosis throughout the basilar artery. 3. Moderate stenosis in the distal left P2 segment. 4. No other significant proximal stenosis, aneurysm, or branch vessel occlusion within the anterior circulation. 5. No significant stenosis in the neck. - MRI brain--  IMPRESSION: 1. Confirmed acute abnormality - cytotoxic edema in the left hemisphere tracking from the posterior left thalamus through the left hippocampus and  mesial temporal lobe. No associated hemorrhage or enhancement. No intracranial mass effect.   2. Nearby chronic appearing white matter signal changes including the Splenium, such that Demyelinating disease was considered. However, multiple other chronic areas most resembling small-vessel infarcts, including elsewhere in the left thalamus, anterior left corona radiata, and right occipital cortex. And in conjunction with MRA report of intracranial atherosclerosis in 2016, strongly favor acute on chronic ischemic disease   CT Head without contrast-Multiple foci of hypodensity in the right occipital lobe, left temporal lobe, and deep white matter  of the posterior left frontal region. These changes all appear new in the interval. Acute to subacute ischemia could have this appearance in multifocal presentation would raise the question of embolic etiology. Foci of vasogenic edema from multiple occult brain lesions would also be a Consideration   - EKG sinus and nonacute - TSH WNL - LDL 104, HDL 52, triglycerides 51, total cholesterol 166 - UDS negative, LFTs not elevated creatinine 0.70 potassium 3.6 sodium 136 chloride 25 glucose 112 - CBC essentially normal   Review of Systems: As mentioned in the history of present illness. All other systems reviewed and are negative.   Hospital Course:   Assessment and Plan: 1)Acute CVA - place on telemetry monitored unit, - echocardiogram without intracardiac thrombus  and   EF is 50 to 55%, grade 1 diastolic dysfunction noted, moderate LVH noted, no aortic stenosis, no mitral stenosis,  - PT/OT eval appreciated recommends outpatient PT   TSH WNL,  A1c-5.2 - LDL 104, HDL 52, triglycerides 51, total cholesterol 166 CTA head with significant intracranial stenoses and occlusions as noted above - MRI brain with acute stroke as noted above - Despite prior stroke in 2015 patient was not on any antiplatelet agent or statins -C/n Aspirin  81 mg daily along with Plavix 75 mg daily for 90 days then after that STOP the Plavix  and continue ONLY Aspirin  81 mg daily indefinitely--for secondary stroke Prevention (Per The multicenter SAMMPRIS trial) -Neurology consult appreciated, outpatient follow-up with neurologist in 8 to 12 weeks advised - 30-day event monitor/Holter/Zio patch recommended   2)Morbid Obesity- -Low calorie diet, portion control and increase physical activity discussed with patient -Body mass index is 51.94 kg/m.   3)HTN--- we initially allowed permissive hypertension -Okay to discharge on amlodipine    Discharge Condition: stable  Follow UP   Follow-up Information     Glory Larsen, MD. Schedule an appointment as soon as possible for a visit in 2 month(s).   Specialty: Neurology Why: Stroke follow-up Contact information: [address] [place] [address] [phone]                 Consults obtained -neurology  Diet and Activity recommendation:  As advised  Discharge Instructions    Discharge Instructions     Ambulatory referral to Neurology   Complete by: As directed    An appointment is requested in approximately: 8 weeks---stroke follow-up   Ambulatory referral to Physical Therapy   Complete by: As directed    Call MD for:  difficulty breathing, headache or visual disturbances   Complete by: As directed    Call MD for:  persistant dizziness or light-headedness   Complete by: As directed    Call MD for:  persistant nausea and vomiting   Complete by: As directed    Call MD for:  temperature >100.4   Complete by: As directed    Diet - low sodium heart healthy  Complete by: As directed    Discharge instructions   Complete by: As directed    1)Take Aspirin  81 mg daily along with Plavix 75 mg daily for 90 days then after that STOP the Plavix and continue ONLY Aspirin  81 mg daily indefinitely--for secondary stroke Prevention.   2)Avoid ibuprofen /Advil /Aleve /Motrin Juluis Ok Powders/Naproxen /BC powders/Meloxicam/Diclofenac/Indomethacin and other Nonsteroidal anti-inflammatory medications as these will make you more likely to bleed and can cause stomach ulcers, can also cause Kidney problems.   3)Please take all your medications as prescribed to Reduce your risk for another stroke  4) you will get a heart monitor to wear for about a month to look for irregular heartbeat like atrial fibrillation which can increase your risk for strokes  5)Morbid Obesity/class III obesity---Body mass index is 51.94 kg/m. -Low calorie diet, portion control and increase physical activity strongly advised-   Increase activity slowly   Complete by: As  directed        Discharge Medications     Allergies as of 11/24/2023       Reactions   Celexa  [citalopram  Hydrobromide] Anxiety        Medication List     STOP taking these medications    ibuprofen  200 MG tablet Commonly known as: ADVIL        TAKE these medications    amLODipine  10 MG tablet Commonly known as: NORVASC  Take 1 tablet (10 mg total) by mouth daily. For BP   aspirin  EC 81 MG tablet Take 1 tablet (81 mg total) by mouth daily with breakfast. - For stroke prevention Start taking on: Nov 25, 2023   atorvastatin 40 MG tablet Commonly known as: LIPITOR Take 1 tablet (40 mg total) by mouth daily. Start taking on: Nov 25, 2023   clopidogrel 75 MG tablet Commonly known as: PLAVIX Take 1 tablet (75 mg total) by mouth daily. Take Aspirin  81 mg daily along with Plavix 75 mg daily for 90 days then after that STOP the Plavix and continue ONLY Aspirin  81 mg daily indefinitely--for secondary stroke Prevention. Start taking on: Nov 25, 2023        Major procedures and Radiology Reports - PLEASE review detailed and final reports for all details, in brief -   ECHOCARDIOGRAM COMPLETE Result Date: 11/24/2023    ECHOCARDIOGRAM REPORT   Patient Name:   Nicole Travis  Date of Exam: 11/24/2023 Medical Rec #:  [id number]  Height:       61.0 in Accession #:    [id number] Weight:       274.9 lb Date of Birth:  04/20/1981  BSA:          2.162 m Patient Age:    42 years   BP:           182/95 mmHg Patient Gender: F          HR:           86 bpm. Exam Location:  [facility] Procedure: 2D Echo, Cardiac Doppler, Color Doppler, Saline Contrast Bubble Study            and Intracardiac Opacification Agent (Both Spectral and Color Flow            Doppler were utilized during procedure). Indications:    Stroke I63.9  History:        Patient has no prior history of Echocardiogram examinations.                 Risk Factors:Hypertension. Morbid Obesity.  Sonographer:  Denese Finn RCS Referring Phys:  [id number] [facility]  Sonographer Comments: Technically difficult study due to poor echo windows. Image acquisition challenging due to patient body habitus. IMPRESSIONS  1. No LV thrombus by Definity. Left ventricular ejection fraction, by estimation, is 70 to 75%. The left ventricle has hyperdynamic function. Left ventricular endocardial border not optimally defined to evaluate regional wall motion. There is moderate asymmetric left ventricular hypertrophy of the septal segment. Left ventricular diastolic parameters are consistent with Grade I diastolic dysfunction (impaired relaxation).  2. Right ventricular systolic function is normal. The right ventricular size is normal. Tricuspid regurgitation signal is inadequate for assessing PA pressure.  3. The mitral valve is normal in structure. No evidence of mitral valve regurgitation. No evidence of mitral stenosis.  4. The aortic valve was not well visualized. Aortic valve regurgitation is not visualized. No aortic stenosis is present.  5. The inferior vena cava is normal in size with greater than 50% respiratory variability, suggesting right atrial pressure of 3 mmHg. Comparison(s): No prior Echocardiogram. FINDINGS  Left Ventricle: No LV thrombus by Definity. Left ventricular ejection fraction, by estimation, is 70 to 75%. The left ventricle has hyperdynamic function. Left ventricular endocardial border not optimally defined to evaluate regional wall motion. Definity contrast agent was given IV to delineate the left ventricular endocardial borders. Strain was performed and the global longitudinal strain is indeterminate. The left ventricular internal cavity size was normal in size. There is moderate asymmetric left ventricular hypertrophy of the septal segment. Left ventricular diastolic parameters are consistent with Grade I diastolic dysfunction (impaired relaxation). Right Ventricle: The right ventricular size is normal. No increase in right ventricular wall  thickness. Right ventricular systolic function is normal. Tricuspid regurgitation signal is inadequate for assessing PA pressure. Left Atrium: Left atrial size was normal in size. Right Atrium: Right atrial size was normal in size. Pericardium: There is no evidence of pericardial effusion. Mitral Valve: The mitral valve is normal in structure. No evidence of mitral valve regurgitation. No evidence of mitral valve stenosis. Tricuspid Valve: The tricuspid valve is normal in structure. Tricuspid valve regurgitation is not demonstrated. No evidence of tricuspid stenosis. Aortic Valve: The aortic valve was not well visualized. Aortic valve regurgitation is not visualized. No aortic stenosis is present. Pulmonic Valve: The pulmonic valve was not well visualized. Pulmonic valve regurgitation is not visualized. No evidence of pulmonic stenosis. Aorta: The aortic root is normal in size and structure. Venous: The inferior vena cava is normal in size with greater than 50% respiratory variability, suggesting right atrial pressure of 3 mmHg. IAS/Shunts: The atrial septum is grossly normal. Agitated saline contrast was given intravenously to evaluate for intracardiac shunting. Additional Comments: 3D was performed not requiring image post processing on an independent workstation and was indeterminate.  LEFT VENTRICLE PLAX 2D LVIDd:         4.90 cm   Diastology LVIDs:         2.50 cm   LV e' medial:    8.49 cm/s LV PW:         1.10 cm   LV E/e' medial:  10.3 LV IVS:        1.40 cm   LV e' lateral:   9.36 cm/s LVOT diam:     2.20 cm   LV E/e' lateral: 9.4 LV SV:         87 LV SV Index:   40 LVOT Area:     3.80 cm  LEFT ATRIUM  Index LA diam:        4.10 cm 1.90 cm/m LA Vol (A2C):   36.7 ml 16.97 ml/m LA Vol (A4C):   59.2 ml 27.38 ml/m LA Biplane Vol: 46.8 ml 21.64 ml/m  AORTIC VALVE LVOT Vmax:   109.00 cm/s LVOT Vmean:  78.100 cm/s LVOT VTI:    0.230 m  AORTA Ao Root diam: 3.00 cm MITRAL VALVE MV Area (PHT): 3.85  cm    SHUNTS MV Decel Time: 197 msec    Systemic VTI:  0.23 m MV E velocity: 87.80 cm/s  Systemic Diam: 2.20 cm MV A velocity: 96.80 cm/s MV E/A ratio:  0.91 Vishnu Priya Mallipeddi Electronically signed by Lucetta Russel Mallipeddi Signature Date/Time: 11/24/2023/5:00:34 PM    Final    CT CHEST ABDOMEN PELVIS W CONTRAST Result Date: 11/24/2023 CLINICAL DATA:  Occult malignancy.  Altered mental status. EXAM: CT CHEST, ABDOMEN, AND PELVIS WITH CONTRAST TECHNIQUE: Multidetector CT imaging of the chest, abdomen and pelvis was performed following the standard protocol during bolus administration of intravenous contrast. RADIATION DOSE REDUCTION: This exam was performed according to the departmental dose-optimization program which includes automated exposure control, adjustment of the mA and/or kV according to patient size and/or use of iterative reconstruction technique. CONTRAST:  OMNIPAQUE  IOHEXOL  300 MG/ML  SOLN COMPARISON:  None Available. FINDINGS: CT CHEST FINDINGS Cardiovascular: Normal cardiac size. No pericardial effusion. No aortic aneurysm. Mediastinum/Nodes: Visualized thyroid gland appears grossly unremarkable. No solid / cystic mediastinal masses. The esophagus is nondistended precluding optimal assessment. No axillary, mediastinal or hilar lymphadenopathy by size criteria. Lungs/Pleura: The central tracheo-bronchial tree is patent. No mass or consolidation. No pleural effusion or pneumothorax. No suspicious lung nodules. Musculoskeletal: The visualized soft tissues of the chest wall are grossly unremarkable. No suspicious osseous lesions. There are mild multilevel degenerative changes in the visualized spine. CT ABDOMEN PELVIS FINDINGS Hepatobiliary: The liver is normal in size. Non-cirrhotic configuration. No suspicious mass. These is mild diffuse hepatic steatosis. No intrahepatic or extrahepatic bile duct dilation. No calcified gallstones. Normal gallbladder wall thickness. No pericholecystic  inflammatory changes. Pancreas: Unremarkable. No pancreatic ductal dilatation or surrounding inflammatory changes. Spleen: Within normal limits. No focal lesion. Adrenals/Urinary Tract: Adrenal glands are unremarkable. No suspicious renal mass. No hydronephrosis. No renal or ureteric calculi. Unremarkable urinary bladder. Stomach/Bowel: No disproportionate dilation of the small or large bowel loops. No evidence of abnormal bowel wall thickening or inflammatory changes. The appendix is unremarkable. Vascular/Lymphatic: No ascites or pneumoperitoneum. No abdominal or pelvic lymphadenopathy, by size criteria. No aneurysmal dilation of the major abdominal arteries. Reproductive: The uterus is unremarkable. No large adnexal mass. Other: The visualized soft tissues and abdominal wall are unremarkable. Musculoskeletal: No suspicious osseous lesions. There are mild - moderate multilevel degenerative changes in the visualized spine. IMPRESSION: 1. No metastatic disease identified within the chest, abdomen or pelvis. 2. Multiple other nonacute observations, as described above. Electronically Signed   By: Beula Brunswick M.D.   On: 11/24/2023 13:51   CT ANGIO HEAD NECK W WO CM Result Date: 11/23/2023 CLINICAL DATA:  Altered mental status. Acute nonhemorrhagic infarct involving the left thalamus and left hippocampus. EXAM: CT ANGIOGRAPHY HEAD AND NECK WITH AND WITHOUT CONTRAST TECHNIQUE: Multidetector CT imaging of the head and neck was performed using the standard protocol during bolus administration of intravenous contrast. Multiplanar CT image reconstructions and MIPs were obtained to evaluate the vascular anatomy. Carotid stenosis measurements (when applicable) are obtained utilizing NASCET criteria, using the distal internal carotid diameter as the  denominator. RADIATION DOSE REDUCTION: This exam was performed according to the departmental dose-optimization program which includes automated exposure control, adjustment of  the mA and/or kV according to patient size and/or use of iterative reconstruction technique. CONTRAST:  75mL OMNIPAQUE  IOHEXOL  350 MG/ML SOLN COMPARISON:  CT head without contrast and MR head without and with contrast 11/23/2023. CT angio neck 10/18/2014. MR angio head 10/18/2014 FINDINGS: CTA NECK FINDINGS Aortic arch: Common origin of the left common carotid artery and innominate artery is noted. No significant atherosclerotic changes are present the aortic arch. Aneurysm or focal stenosis is present. Right carotid system: The right common carotid artery is within normal limits. The bifurcation is normal. The cervical right ICA demonstrates mild tortuosity without focal stenosis. Left carotid system: The left common carotid artery is within normal limits. The bifurcation is unremarkable. Mild tortuosity is present cervical left ICA without significant stenosis. Vertebral arteries: The left vertebral artery is dominant vessel. Both vertebral arteries originate from the subclavian arteries without significant stenosis. No significant stenosis is present in either vertebral artery in the neck. Skeleton: The vertebral body heights and alignment are normal. No focal osseous lesions are present. Multiple dental caries are noted bilaterally. No focal osseous lesions are present. Other neck: The soft tissues of the neck are unremarkable. Salivary glands are within normal limits. Thyroid is normal. No significant adenopathy is present. No focal mucosal or submucosal lesions are present. Upper chest: The lung apices are clear. The thoracic inlet is within normal limits. Review of the MIP images confirms the above findings CTA HEAD FINDINGS Anterior circulation: Minimal calcifications present cavernous right ICA. No significant stenosis is present through the ICA terminus. The left internal carotid artery is within normal limits from the skull base to the ICA terminus. The left A1 segment is dominant. The anterior  communicating arteries are patent. MCA bifurcations are normal. The ACA and MCA branch vessels are normal bilaterally. Posterior circulation: The right vertebral artery since it terminates at the PICA. Left PICA origin is visualized and normal. The left vertebral artery is within normal limits. Moderate irregular stenosis is present throughout the basilar artery. The superior cerebellar arteries and a small left P1 segment are patent. Left posterior cerebral artery is of fetal type. High-grade stenosis or occlusion of the proximal right posterior cerebral artery is noted. There is some collateralization distal vessels. Moderate stenosis is present in the distal left P2 segment. Venous sinuses: The dural sinuses are patent. The straight sinus and deep cerebral veins are intact. Cortical veins are within normal limits. No significant vascular malformation is evident. Anatomic variants: Fetal type left posterior cerebral artery. Review of the MIP images confirms the above findings IMPRESSION: 1. High-grade stenosis or occlusion of the proximal right posterior cerebral artery with some collateralization of distal vessels. 2. Moderate irregular stenosis throughout the basilar artery. 3. Moderate stenosis in the distal left P2 segment. 4. No other significant proximal stenosis, aneurysm, or branch vessel occlusion within the anterior circulation. 5. No significant stenosis in the neck. 6. Multiple dental caries bilaterally. These results will be called to the ordering clinician or representative by the Radiologist Assistant, and communication documented in the PACS or [employer]. Electronically Signed   By: Audree Leas M.D.   On: 11/23/2023 15:27   MR BRAIN W WO CONTRAST Result Date: 11/23/2023 CLINICAL DATA:  43 year old female with headache, altered mental status. Abnormal hypodensity on head CT today. 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 EXAM: MRI HEAD WITHOUT AND WITH CONTRAST  TECHNIQUE: Multiplanar, multiecho pulse sequences of the brain and surrounding structures were obtained without and with intravenous contrast. CONTRAST:  10mL GADAVIST GADOBUTROL 1 MMOL/ML IV SOLN COMPARISON:  Head CT 1137 hours today. Brain MRI 10/18/2014 (only some images of that exam are available). FINDINGS: Brain: Confluent restricted diffusion in the left hemisphere from the posterior periventricular white matter tracking caudal through the dorsal left thalamus and into the mesial left temporal lobe. See series 5, images 33 through 23. This is directly affecting the left hippocampal formation. Associated patchy and indistinct T2 and FLAIR hyperintensity which is mild. No hemorrhage or mass effect there. Superimposed well-developed cystic encephalomalacia in the more central left thalamus most resembles a chronic lacunar infarct (series 9, image 14). Superimposed appearance of chronic lacunar infarct also in the anterior left corona radiata tracking into the left basal ganglia. And patchy cortical encephalomalacia along the right parieto-occipital sulcus. Furthermore, confluent abnormal T2 and FLAIR hyperintensity in the splenium of the corpus callosum and into the periatrial white matter bilaterally (series 10, image 27) with no expansion or volume loss of the splenium. Mild patchy T2 hyperintensity in the right corona radiata and lentiform. No other restricted diffusion identified. No other cortical encephalomalacia. No abnormal enhancement identified. No midline shift, mass effect, evidence of mass lesion, ventriculomegaly, extra-axial collection or acute intracranial hemorrhage. Cervicomedullary junction and pituitary are within normal limits. No dural thickening. Vascular: Major intracranial vascular flow voids are preserved. Following contrast the major dural venous sinuses are enhancing and appear to be patent. Skull and upper cervical spine: Negative visible cervical spine and spinal cord. Visualized  bone marrow signal is within normal limits. Sinuses/Orbits: Rightward gaze, otherwise negative. Paranasal Visualized paranasal sinuses and mastoids are stable and well aerated. Other: Visible internal auditory structures appear normal. Negative visible scalp and face. IMPRESSION: 1. Confirmed acute abnormality - cytotoxic edema in the left hemisphere tracking from the posterior left thalamus through the left hippocampus and mesial temporal lobe. No associated hemorrhage or enhancement. No intracranial mass effect. 2. Nearby chronic appearing white matter signal changes including the Splenium, such that Demyelinating disease was considered. However, multiple other chronic areas most resembling small-vessel infarcts, including elsewhere in the left thalamus, anterior left corona radiata, and right occipital cortex. And in conjunction with MRA report of intracranial atherosclerosis in 2016, strongly favor acute on chronic ischemic disease. Electronically Signed   By: Marlise Simpers M.D.   On: 11/23/2023 12:35   CT HEAD WO CONTRAST Result Date: 11/23/2023 CLINICAL DATA:  Headache.  Forgetfulness.  Mental status changes. EXAM: CT HEAD WITHOUT CONTRAST TECHNIQUE: Contiguous axial images were obtained from the base of the skull through the vertex without intravenous contrast. RADIATION DOSE REDUCTION: This exam was performed according to the departmental dose-optimization program which includes automated exposure control, adjustment of the mA and/or kV according to patient size and/or use of iterative reconstruction technique. COMPARISON:  10/18/2014 FINDINGS: Brain: No evidence for acute hemorrhage, hydrocephalus, or abnormal extra-axial fluid collection. Small foci of hypodensity is seen in the posterior right occipital lobe on image 19 of series 2 and coronal 69/4 as well as in the inferior right occipital lobe on axial 15/2 and coronal 64/4. Abnormal hypodensity is seen in the medial and inferior left temporal lobe  including axial images 13-15 of series 2 and coronal image 50 of series 4. Focal hypodensity noted in the deep white matter of the posterior left frontal region on axial 20/2. These changes all appear new in the interval. Vascular: No hyperdense  vessel or unexpected calcification. Skull: No evidence for fracture. No worrisome lytic or sclerotic lesion. Sinuses/Orbits: The visualized paranasal sinuses and mastoid air cells are clear. Visualized portions of the globes and intraorbital fat are unremarkable. Other: None. IMPRESSION: Multiple foci of hypodensity in the right occipital lobe, left temporal lobe, and deep white matter of the posterior left frontal region. These changes all appear new in the interval. Acute to subacute ischemia could have this appearance in multifocal presentation would raise the question of embolic etiology. Foci of vasogenic edema from multiple occult brain lesions would also be a consideration. MRI of the brain with and without contrast recommended to further evaluate. Critical Value/emergent results were called by telephone at the time of interpretation on 11/23/2023 at 11:53 am to provider [facility] , who verbally acknowledged these results. Electronically Signed   By: Donnal Fusi M.D.   On: 11/23/2023 11:54   Today   Subjective    Nicole Travis today has no new complaints - Son and cousin at bedside, questions answered No chest pains no palpitations   Patient has been seen and examined prior to discharge   Objective   Blood pressure (!) 177/93, pulse 79, temperature 98.2 F (36.8 C), temperature source Oral, resp. rate 18, height 5\' 1"  (1.549 m), weight 124.7 kg, SpO2 95%.  Exam Gen:- Awake Alert, morbidly obese, no acute distress  HEENT:- [place].AT, No sclera icterus Neck-Supple Neck,No JVD,.  Lungs-  CTAB , good air movement bilaterally CV- S1, S2 normal, regular Abd-  +ve B.Sounds, Abd Soft, No tenderness, increased adiposity Extremity/Skin:- No  edema,   good  pulses Psych-affect is appropriate, oriented x3 Neuro-right eye peripheral vision concerns otherwise no additional new focal deficits  , no tremors    Data Review   CBC w Diff:  Lab Results  Component Value Date   WBC 9.7 11/23/2023   HGB 13.9 11/23/2023   HGB 12.6 04/28/2017   HCT 45.0 11/23/2023   HCT 39.4 04/28/2017   PLT 371 11/23/2023   PLT 334 04/28/2017   LYMPHOPCT 20 11/23/2023   MONOPCT 4 11/23/2023   EOSPCT 1 11/23/2023   BASOPCT 1 11/23/2023   CMP:  Lab Results  Component Value Date   NA 136 11/23/2023   NA 142 02/06/2015   K 3.6 11/23/2023   CL 100 11/23/2023   CO2 25 11/23/2023   BUN 11 11/23/2023   BUN 16 02/06/2015   CREATININE 0.70 11/23/2023   PROT 8.9 (H) 11/23/2023   ALBUMIN 4.0 11/23/2023   BILITOT 0.7 11/23/2023   ALKPHOS 87 11/23/2023   AST 27 11/23/2023   ALT 25 11/23/2023   Total Discharge time is about 33 minutes  Colin Dawley M.D on 11/24/2023 at 5:29 PM  Go to [email or web address] -  for contact info  [facility] - Office  [phone]

## 2023-11-24 NOTE — Progress Notes (Signed)
*  PRELIMINARY RESULTS* Echocardiogram 2D Echocardiogram has been performed with Definity and Saline Bubble Study.  Nicole Travis 11/24/2023, 3:32 PM

## 2023-11-25 LAB — LUPUS ANTICOAGULANT PANEL
DRVVT: 40.7 s (ref 0.0–47.0)
PTT Lupus Anticoagulant: 36.1 s (ref 0.0–43.5)

## 2023-11-25 LAB — PROTEIN C ACTIVITY: Protein C Activity: 148 % (ref 73–180)

## 2023-11-25 LAB — PROTEIN S ACTIVITY: Protein S Activity: 91 % (ref 63–140)

## 2023-11-25 LAB — BETA-2-GLYCOPROTEIN I ABS, IGG/M/A
Beta-2 Glyco I IgG: <9 GPI IgG units (ref 0–20)
Beta-2-Glycoprotein I IgA: <9 GPI IgA units (ref 0–25)
Beta-2-Glycoprotein I IgM: <9 GPI IgM units (ref 0–32)

## 2023-11-25 LAB — PROTEIN S, TOTAL: Protein S Ag, Total: 87 % (ref 60–150)

## 2023-11-25 LAB — HOMOCYSTEINE: Homocysteine: 10.8 umol/L (ref 0.0–14.5)

## 2023-11-25 LAB — PROTEIN C, TOTAL: Protein C, Total: 132 % (ref 60–150)

## 2023-11-26 LAB — CARDIOLIPIN ANTIBODIES, IGG, IGM, IGA
Anticardiolipin IgA: <9 U/mL (ref 0–11)
Anticardiolipin IgG: <9 GPL U/mL (ref 0–14)
Anticardiolipin IgM: 19 [MPL'U]/mL — ABNORMAL HIGH (ref 0–12)

## 2023-11-28 LAB — PROTHROMBIN GENE MUTATION

## 2023-12-01 LAB — FACTOR 5 LEIDEN

## 2023-12-04 ENCOUNTER — Telehealth: Payer: Self-pay | Admitting: *Deleted

## 2023-12-04 NOTE — Telephone Encounter (Signed)
 Received fax from [employer] ( [employer]) of a cancellation. The reason being "Unable to deliver required patient benefit quote".

## 2024-01-09 ENCOUNTER — Encounter: Payer: Self-pay | Admitting: Neurology

## 2024-01-09 ENCOUNTER — Ambulatory Visit (INDEPENDENT_AMBULATORY_CARE_PROVIDER_SITE_OTHER): Payer: Self-pay | Admitting: Neurology

## 2024-01-09 VITALS — BP 150/94 | HR 86 | Ht 62.0 in | Wt 269.6 lb

## 2024-01-09 DIAGNOSIS — R002 Palpitations: Secondary | ICD-10-CM

## 2024-01-09 DIAGNOSIS — G4730 Sleep apnea, unspecified: Secondary | ICD-10-CM

## 2024-01-09 DIAGNOSIS — I6310 Cerebral infarction due to embolism of unspecified precerebral artery: Secondary | ICD-10-CM

## 2024-01-09 NOTE — Patient Instructions (Addendum)
 30 day heart monitor (she has had embolic stroke and feeling palpitations) looking for afib or other cause of stroke TEE - Transesophageal Echocardiogram (examine the heart closer) and possibly loop recorder if the 30 day monitor does not show afib or no reason found may need to monitor heart rhythm long term Sleep apnea, discuss with sleep team Continue ASA and plavix  for 3 months total then ASA alone [facility] financial assistance and insurance/medicaid   Stroke Prevention Some medical conditions and behaviors can lead to a higher chance of having a stroke. You can help prevent a stroke by eating healthy, exercising, not smoking, and managing any medical conditions you have. Stroke is a leading cause of functional impairment. Primary prevention is particularly important because a majority of strokes are first-time events. Stroke changes the lives of not only those who experience a stroke but also their family and other caregivers. How can this condition affect me? A stroke is a medical emergency and should be treated right away. A stroke can lead to brain damage and can sometimes be life-threatening. If a person gets medical treatment right away, there is a better chance of surviving and recovering from a stroke. What can increase my risk? The following medical conditions may increase your risk of a stroke: Cardiovascular disease. High blood pressure (hypertension). Diabetes. High cholesterol. Sickle cell disease. Blood clotting disorders (hypercoagulable state). Obesity. Sleep disorders (obstructive sleep apnea). Other risk factors include: Being older than age 79. Having a history of blood clots, stroke, or mini-stroke (transient ischemic attack, TIA). Genetic factors, such as race, ethnicity, or a family history of stroke. Smoking cigarettes or using other tobacco products. Taking birth control pills, especially if you also use tobacco. Heavy use of alcohol or drugs, especially cocaine and  methamphetamine. Physical inactivity. What actions can I take to prevent this? Manage your health conditions High cholesterol levels. Eating a healthy diet is important for preventing high cholesterol. If cholesterol cannot be managed through diet alone, you may need to take medicines. Take any prescribed medicines to control your cholesterol as told by your health care provider. Hypertension. To reduce your risk of stroke, try to keep your blood pressure below 130/80. Eating a healthy diet and exercising regularly are important for controlling blood pressure. If these steps are not enough to manage your blood pressure, you may need to take medicines. Take any prescribed medicines to control hypertension as told by your health care provider. Ask your health care provider if you should monitor your blood pressure at home. Have your blood pressure checked every year, even if your blood pressure is normal. Blood pressure increases with age and some medical conditions. Diabetes. Eating a healthy diet and exercising regularly are important parts of managing your blood sugar (glucose). If your blood sugar cannot be managed through diet and exercise, you may need to take medicines. Take any prescribed medicines to control your diabetes as told by your health care provider. Get evaluated for obstructive sleep apnea. Talk to your health care provider about getting a sleep evaluation if you snore a lot or have excessive sleepiness. Make sure that any other medical conditions you have, such as atrial fibrillation or atherosclerosis, are managed. Nutrition Follow instructions from your health care provider about what to eat or drink to help manage your health condition. These instructions may include: Reducing your daily calorie intake. Limiting how much salt (sodium) you use to 1,500 milligrams (mg) each day. Using only healthy fats for cooking, such as olive oil,  canola oil, or sunflower oil. Eating  healthy foods. You can do this by: Choosing foods that are high in fiber, such as whole grains, and fresh fruits and vegetables. Eating at least 5 servings of fruits and vegetables a day. Try to fill one-half of your plate with fruits and vegetables at each meal. Choosing lean protein foods, such as lean cuts of meat, poultry without skin, fish, tofu, beans, and nuts. Eating low-fat dairy products. Avoiding foods that are high in sodium. This can help lower blood pressure. Avoiding foods that have saturated fat, trans fat, and cholesterol. This can help prevent high cholesterol. Avoiding processed and prepared foods. Counting your daily carbohydrate intake.  Lifestyle If you drink alcohol: Limit how much you have to: 0-1 drink a day for women who are not pregnant. 0-2 drinks a day for men. Know how much alcohol is in your drink. In the [place] one drink equals one 12 oz bottle of beer ( ), one 5 oz glass of wine ( ), or one 1 oz glass of hard liquor (44mL). Do not use any products that contain nicotine or tobacco. These products include cigarettes, chewing tobacco, and vaping devices, such as e-cigarettes. If you need help quitting, ask your health care provider. Avoid secondhand smoke. Do not use drugs. Activity  Try to stay at a healthy weight. Get at least 30 minutes of exercise on most days, such as: Fast walking. Biking. Swimming. Medicines Take over-the-counter and prescription medicines only as told by your health care provider. Aspirin  or blood thinners (antiplatelets or anticoagulants) may be recommended to reduce your risk of forming blood clots that can lead to stroke. Avoid taking birth control pills. Talk to your health care provider about the risks of taking birth control pills if: You are over 60 years old. You smoke. You get very bad headaches. You have had a blood clot. Where to find more information American Stroke Association: www.strokeassociation.org Get  help right away if: You or a loved one has any symptoms of a stroke. BE FAST is an easy way to remember the main warning signs of a stroke: B - Balance. Signs are dizziness, sudden trouble walking, or loss of balance. E - Eyes. Signs are trouble seeing or a sudden change in vision. F - Face. Signs are sudden weakness or numbness of the face, or the face or eyelid drooping on one side. A - Arms. Signs are weakness or numbness in an arm. This happens suddenly and usually on one side of the body. S - Speech. Signs are sudden trouble speaking, slurred speech, or trouble understanding what people say. T - Time. Time to call emergency services. Write down what time symptoms started. You or a loved one has other signs of a stroke, such as: A sudden, severe headache with no known cause. Nausea or vomiting. Seizure. These symptoms may represent a serious problem that is an emergency. Do not wait to see if the symptoms will go away. Get medical help right away. Call your local emergency services (911 in the [place] Do not drive yourself to the hospital. Summary You can help to prevent a stroke by eating healthy, exercising, not smoking, limiting alcohol intake, and managing any medical conditions you may have. Do not use any products that contain nicotine or tobacco. These include cigarettes, chewing tobacco, and vaping devices, such as e-cigarettes. If you need help quitting, ask your health care provider. Remember BE FAST for warning signs of a stroke. Get help right away if you  or a loved one has any of these signs. This information is not intended to replace advice given to you by your health care provider. Make sure you discuss any questions you have with your health care provider. Document Revised: 06/10/2022 Document Reviewed: 06/10/2022 Elsevier Patient Education  2024 Elsevier Inc.  Transesophageal Echocardiogram Transesophageal echocardiogram, or TEE, is a test that uses sound waves to make  pictures of your heart. TEE is done using a small ultrasound probe. The probe is passed down your esophagus, which is the part of your body that moves food from your mouth to your stomach. Because your heart is near your esophagus, the TEE will give clear pictures of your heart. Your health care provider can use a TEE: To see how different parts of your heart are working. To check for problems with your heart, such as infection, blood clots, or growths. You may feel the probe in your throat, but the test usually doesn't cause pain or affect your breathing. Tell a health care provider about: Any allergies you have. All medicines you are taking. These include vitamins, herbs, eye drops, creams, and over-the-counter medicines. Any problems you or family members have had with anesthesia. Any bleeding problems you have. Any surgeries you have had. Any medical conditions you have. Any trouble with swallowing. Whether you have or have had a blockage of the esophagus. Whether you're pregnant or may be pregnant. What are the risks? Your provider will talk with you about risks. These may include: Damage to nearby structures or organs. A tear of the esophagus. Fast or uneven heartbeats. A hoarse voice or trouble swallowing. Bleeding. What happens before the procedure? Medicines Ask about changing or stopping: Any medicines you take. Any vitamins, herbs, or supplements you take. Do not take aspirin  or ibuprofen  unless you're told to. General instructions Follow instructions about what you may eat and drink. You will need to take out any dentures or dental retainers. Ask if you'll be staying overnight in the hospital. If you'll be going home right after the test, plan to have a responsible adult: Drive you home from the hospital or clinic. You won't be allowed to drive. Stay with you for the time you are told. What happens during the procedure?  An IV will be put into a vein in your hand or  arm. You will be given: A sedative. This helps you relax. Anesthesia. This keeps you from feeling pain. It will be sprayed, or you'll gargle it, to numb the back of your throat. You may be asked to lie on your left side. A bite block will be put in your mouth. This keeps you from biting the probe. The tip of the probe will be placed into the back of your mouth. You'll be asked to swallow. Once the probe is in place, your provider will take pictures of your heart. The probe and bite block will be taken out after the test is done. The procedure may vary among providers and hospitals. What can I expect after the procedure? You will be watched closely until you leave. This includes checking your blood pressure, heart rate, breathing rate, and blood oxygen  level. Your throat may feel numb or sore. This will get better over time. You will not be allowed to eat or drink until the numbness has gone away. Ask when your test results will be ready and how to get them. You may need to call or meet with your provider to discuss your results. This information is  not intended to replace advice given to you by your health care provider. Make sure you discuss any questions you have with your health care provider. Document Revised: 04/10/2023 Document Reviewed: 09/18/2022 Elsevier Patient Education  2024 Elsevier Inc.Ambulatory Cardiac Monitoring An ambulatory cardiac monitor is a small recording device that is used to detect abnormal heart rhythms (arrhythmias). The monitor is attached to the chest with wires by either flat, sticky disks (electrodes) or with a single small adhesive patch. You may need to wear a monitor if you have had symptoms such as: Slow, fluttering, fast, or irregular heartbeats (palpitations). Dizziness. Fainting or light-headedness. Unexplained weakness. Shortness of breath. What are the risks? Generally, these devices are safe to use. However, it is possible that the skin under the  electrodes will become irritated. Supplies needed: A monitor. There are several types of monitors including: Holter monitor. This records your heart rhythm continuously, usually for 24-48 hours. Event (episodic) monitor. This monitor has a symptoms button, and when pushed, it will begin recording. You need to activate this monitor to record when you have a heart-related symptom. Automatic detection monitor. This monitor will begin recording when it detects an abnormal heartbeat. Patch recorder. This monitor is an adhesive patch that monitors your heart activity for 7-14 days. Implantable loop recorder. This monitor is a small device that is implanted under the skin and can be in place for up to 3 years. Electrodes, if needed. Medical tape. A clipper or shaver, if you have chest hair. How to prepare for monitoring Your health care provider will prepare your chest for the electrode placement and show you how to use the monitor. Make sure that you: Do not apply lotions, creams, serums, or oils to your chest before monitoring. This can cause a poor connection. Understand how to care for the monitor. Understand how to send the information from the monitor to your health care provider. You may need a phone, mobile device, or computer to send information. Understand how long you will be wearing your monitor. Know how and when to return the monitor when the testing period is complete. How to use your cardiac monitor Follow directions about how long to wear the monitor. Make sure the monitor is safely clipped to your clothing or in a location close to your body as told by your health care provider. If your monitor has a symptoms button, press the button to mark an event as soon as you feel a heart-related symptom, such as: Dizziness. Light-headedness. Palpitations. Thumping or pounding in your chest. Shortness of breath. Unexplained weakness. Keep a diary of your activities, such as walking, doing  chores, and taking medicine. If your monitor has a button, it is very important to note what you were doing when you pushed the button to record your symptoms. This will help your health care provider determine what might be contributing to your symptoms. Send the recorded information from the monitor to your health care provider. It may take some time for your health care provider to process the results. Change the electrodes as told by your health care provider, or any time they stop sticking to your skin. You may need to use medical tape to keep them on. Follow these instructions at home Bathing Ask your health care provider if you may take the monitor off to shower or bathe. Do not take baths, swim, or use a hot tub until your health care provider approves. Ask your health care provider if you may take showers. You  may only be allowed to take sponge baths. If the monitor is not waterproof: Do not let it get wet. Cover it with a watertight covering when you take a bath or shower. Skin care Keep your skin clean. Try to put the electrodes in slightly different places on your chest to help prevent skin irritation. Follow directions from your health care provider about where to place the electrodes. If you have chest hair, trim the hair where the electrodes are placed. General instructions Change the batteries in the monitor as told by your health care provider. Contact your health care provider if you have significant skin irritation from the monitor. Keep all follow-up visits. This monitor provides information to your health care provider that might change your plan of care. General recommendations Keep electronic devices away from your monitor. These include: Tablets. MP3 players. Mobile phones. While wearing your monitor, you should avoid: Electric blankets. Firefighter. Electric toothbrushes. Microwaves. Magnets. Metal detectors. Where to find more information American Heart  Association: heart.org Get help right away if you: Have chest pain. Have shortness of breath or extreme difficulty breathing. Develop a very fast heartbeat that does not get better. Develop dizziness that does not go away. Faint or constantly feel like you are about to faint. These symptoms may be an emergency. Get help right away. Call 911. Do not wait to see if the symptoms will go away. Do not drive yourself to the hospital. This information is not intended to replace advice given to you by your health care provider. Make sure you discuss any questions you have with your health care provider. Document Revised: 12/03/2021 Document Reviewed: 12/03/2021 Elsevier Patient Education  2024 [employer].

## 2024-01-09 NOTE — Progress Notes (Unsigned)
 [facility]    Provider:  Dr Tresia Fruit Requesting Provider: Colin Dawley, MD Primary Care Provider:  Patient, No Pcp Per  CC:  stroke  HPI:  Nicole Travis is a 43 y.o. female here as requested by Colin Dawley, MD for hospital follow up and stroke. has Complicated migraine, intractable; Acute Stroke (cerebrum) (HCC); Convulsions/seizures (HCC); Hemisensory deficit; Hemiparesis (HCC); Migraine; Thalamic pain syndrome; Facial cellulitis; Morbid obesity with BMI of 50.0-59.9, adult (HCC); Acute stroke due to ischemia [facility]); and HTN (hypertension) on their problem list.  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  She hadn't been on medicine, she was not on aspirin  she lost insurance, her father was in the ospital in October 27, 2023 and died and there was stress, 2 months later her mother was in an accident 2 days ago and states that is why her BP is elevated, prior to th stroke she had ot taken any of her medications daughter is here and provides much information, since the stroke taking all medication, managing BP and on aspirin  and plavix  for 3 months and then aspirin  alone, didn;t get the 30 day monitoring. She has sleep apnea and uses her cpap every single night, pressure hasn;t been adjusted and the cpap is not hers because hers was damaged and uses someone elses and unsure if the settings are correct. She wsa seen at [facility] but closed down. Last sleep study was 7 years ago or longer.  They were sitting at work, she was disoriented, the stroke started and lasted for days, states it was called an elongated stroke was not on her aspirin  before the stroke now they have a lot of bottles and won;t miss it again. She has anxiety, she does not have a primary care, she does not have insurance. Applied for [employer].  Reviewed notes, labs and imaging from outside physicians, which showed ***  I reviewed Dr. Carolanne Church notes from October 25, 2023: Chief complaint was confusional episodes, right eye vision deficits and memory deficits, she presented to the hospital last known normal was Dec 19, 2023 before bedtime, event happened at home, no tPA as she was outside the window, no thrombectomy has no large vessel occlusion.  NIH stroke scale was 0.  CT of the head showed multiple foci of hypodensity in the right occipital lobe, left temporal lobe and deep white matter of the posterior left frontal region.  Raising the concern of embolic etiology.  CTA of the head and neck with contrast showed high-grade stenosis of the proximal right PCA with some collateralization of distal vessels, moderate regular stenosis throughout the basilar artery, moderate stenosis in the distal left P2 segment.  CT Head without contrast 11/23/2023: Multiple foci of hypodensity in the right occipital lobe, left temporal lobe, and deep white matter of the posterior left frontal region. These changes all appear new in the interval. Acute to subacute ischemia could have this appearance in multifocal presentation would raise the question of embolic etiology. Foci of vasogenic edema from multiple occult brain lesions would also be  a consideration. MRI of the brain with and without contrast recommended to further evaluate. (Reviewed CT of the head images and agree)     CT angio Head and Neck with contrast 11/23/2023:  High-grade stenosis or occlusion of the proximal right posterior cerebral artery with some collateralization of distal  vessels. Moderate irregular stenosis throughout the basilar artery. Moderate stenosis in the distal left P2 segment. No other significant proximal stenosis, aneurysm, or branch vessel occlusion within the anterior circulation. No significant stenosis in the neck.(Reviewed report)  MRI Brain with and without contrast 11/23/2023 1. Confirmed acute abnormality - cytotoxic edema in the left hemisphere tracking from the posterior left thalamus through the left hippocampus and mesial temporal lobe. No associated hemorrhage or enhancement. No intracranial mass effect.   2. Nearby chronic appearing white matter signal changes including the Splenium, such that Demyelinating disease was considered. However, multiple other chronic areas most resembling small-vessel infarcts, including elsewhere in the left thalamus, anterior left corona radiata, and right occipital cortex. And in conjunction with MRA report of intracranial atherosclerosis in 2016, strongly favor acute on chronic ischemic disease.  11/24/2023: CT Chest/abd/pelvis: IMPRESSION: 1. No metastatic disease identified within the chest, abdomen or pelvis.  Echo: IMPRESSIONS     1. No LV thrombus by Definity . Left ventricular ejection fraction, by  estimation, is 70 to 75%. The left ventricle has hyperdynamic function.  Left ventricular endocardial border not optimally defined to evaluate  regional wall motion. There is moderate  asymmetric left ventricular hypertrophy of the septal segment. Left  ventricular diastolic parameters are consistent with Grade I diastolic  dysfunction (impaired relaxation).   2. Right ventricular systolic function is normal. The right ventricular  size is normal. Tricuspid regurgitation signal is inadequate for assessing  PA pressure.   3. The mitral valve is normal in structure. No evidence of mitral valve  regurgitation. No evidence of mitral stenosis.   4. The aortic valve was not well visualized. Aortic  valve regurgitation  is not visualized. No aortic stenosis is present.   5. The inferior vena cava is normal in size with greater than 50%  respiratory variability, suggesting right atrial pressure of 3 mmHg.   Hypercoagulability panel cardiolipin antibodies, prothrombin gene mutation, factor V Leiden, homocystine, beta-2  glycoprotein, lupus anticoagulant, protein S, protein C, all within normal limits.  Also drawn in early May 2025 during hospitalization includes HIV nonreactive, TSH normal, hemoglobin A1c 5.2, LDL 104, urine rapid drug screen negative, ethanol negative, CMP with slightly elevated glucose and total protein otherwise normal, CBC unremarkable,  By review patient's event monitor was canceled, the reason of being unable to deliver required patient benefit quote.   Review of Systems: Patient complains of symptoms per HPI as well as the following symptoms ***. Pertinent negatives and positives per HPI. All others negative.   Social History   Socioeconomic History   Marital status: Married    Spouse name: Not on file   Number of children: 2   Years of education: 12   Highest education level: Not on file  Occupational History   Occupation: [employer]  Tobacco Use   Smoking status: Never   Smokeless tobacco: Never  Vaping Use   Vaping status: Never Used  Substance and Sexual Activity   Alcohol use: No   Drug use: No   Sexual activity: Yes    Birth control/protection: Surgical  Other Topics Concern   Not on file  Social History Narrative   Lives at home with boyfriend  and 4 kids.   Right handed.   Caffeine use:  Drinks 20 ounces per week [facility] Huh)    Social Drivers of Health   Financial Resource Strain: Not on file  Food Insecurity: No Food Insecurity (11/23/2023)   Hunger Vital Sign    Worried About Running Out of Food in the Last Year: Never true    Ran Out of Food in the Last Year: Never true  Transportation Needs: No Transportation Needs (11/23/2023)   PRAPARE -  Administrator, Civil Service (Medical): No    Lack of Transportation (Non-Medical): No  Physical Activity: Not on file  Stress: Not on file  Social Connections: Not on file  Intimate Partner Violence: Not At Risk (11/23/2023)   Humiliation, Afraid, Rape, and Kick questionnaire    Fear of Current or Ex-Partner: No    Emotionally Abused: No    Physically Abused: No    Sexually Abused: No    Family History  Problem Relation Age of Onset   Cervical cancer Mother    Stroke Maternal Grandmother    Heart attack Maternal Grandmother    Heart attack Brother    Breast cancer Sister    Breast cancer Sister    Breast cancer Sister     Past Medical History:  Diagnosis Date   Acute confusional migraine    DUB (dysfunctional uterine bleeding)    Headache    Hemisensory deficit    right   Hypertension    Stroke [facility])    Thalamic pain syndrome     Patient Active Problem List   Diagnosis Date Noted   Morbid obesity with BMI of 50.0-59.9, adult (HCC) 11/23/2023   Acute stroke due to ischemia (HCC) 11/23/2023   HTN (hypertension) 11/23/2023   Facial cellulitis 12/01/2015   Migraine 08/02/2015   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   Hemisensory deficit 11/05/2014   Hemiparesis (HCC) 11/05/2014   Complicated migraine, intractable 10/31/2014   Acute Stroke (cerebrum) (HCC) 10/31/2014   Convulsions/seizures (HCC) 10/31/2014    Past Surgical History:  Procedure Laterality Date   CESAREAN SECTION     TUBAL LIGATION      Current Outpatient Medications  Medication Sig Dispense Refill   amLODipine  (NORVASC ) 10 MG tablet Take 1 tablet (10 mg total) by mouth daily. For BP 90 tablet 3   aspirin  EC 81 MG tablet Take 1 tablet (81 mg total) by mouth daily with breakfast. - For stroke prevention 100 tablet 3   atorvastatin  (LIPITOR) 40 MG tablet Take 1 tablet (40 mg total) by mouth daily. 90 tablet 3   clopidogrel  (PLAVIX ) 75 MG tablet Take 1 tablet (75 mg total) by mouth daily.  Take Aspirin  81 mg daily along with Plavix  75 mg daily for 90 days then after that STOP the Plavix  and continue ONLY Aspirin  81 mg daily indefinitely--for secondary stroke Prevention. 90 tablet 0   No current facility-administered medications for this visit.    Allergies as of 01/09/2024 - Review Complete 11/23/2023  Allergen Reaction Noted   Celexa  [citalopram  hydrobromide] Anxiety 02/06/2015    Vitals: There were no vitals taken for this visit. Last Weight:  Wt Readings from Last 1 Encounters:  11/23/23 274 lb 14.6 oz (124.7 kg)   Last Height:   Ht Readings from Last 1 Encounters:  11/23/23 5' 1 (1.549 m)     Physical exam: Exam: Gen: NAD, conversant, well nourised, obese, well groomed  CV: RRR, no MRG. No Carotid Bruits. No peripheral edema, warm, nontender Eyes: Conjunctivae clear without exudates or hemorrhage  Neuro: Detailed Neurologic Exam  Speech:    Speech is normal; fluent and spontaneous with normal comprehension.  Cognition:    The patient is oriented to person, place, and time;     recent and remote memory intact;     language fluent;     normal attention, concentration,     fund of knowledge Cranial Nerves:    The pupils are equal, round, and reactive to light. The fundi are normal and spontaneous venous pulsations are present. Visual fields are full to finger confrontation. Extraocular movements are intact. Trigeminal sensation is intact and the muscles of mastication are normal. The face is symmetric. The palate elevates in the midline. Hearing intact. Voice is normal. Shoulder shrug is normal. The tongue has normal motion without fasciculations.   Coordination:    Normal finger to nose and heel to shin. Normal rapid alternating movements.   Gait:    Heel-toe and tandem gait are normal.   Motor Observation:    No asymmetry, no atrophy, and no involuntary movements noted. Tone:    Normal muscle tone.    Posture:    Posture is  normal. normal erect    Strength:    Strength is V/V in the upper and lower limbs.      Sensation: intact to LT     Reflex Exam:  DTR's:    Deep tendon reflexes in the upper and lower extremities are normal bilaterally.   Toes:    The toes are downgoing bilaterally.   Clonus:    Clonus is absent.    Assessment/Plan:    ASSESSMENT/PLAN: 43 year old female presented with sudden onset of confusion, memory deficits.   Acute ischemic stroke Etiology: Likely embolic   Recommendations: - Recommend aspirin  81 mg daily and Plavix  75 mg daily for 3 months followed by aspirin  81 mg daily - Recommend atorvastatin  40 mg daily for goal LDL less than 70 - Due to patient's young age and no significant risk factors, will check CT chest Abdo pelvis with contrast to look for any acute occult malignancy - Will also check hypercoag workup due to stroke in young age - TTE ordered and pending.  If negative, recommend 30-day cardiac monitor to look for paroxysmal A-fib - PT/OT - Goal blood pressure: Gradual normotension - Follow-up with Dr. Tresia Fruit at [facility] in 2 to 3 months - Discussed plan with patient son at bedside and Dr. Quintella Buck via secure chat    No orders of the defined types were placed in this encounter.  No orders of the defined types were placed in this encounter.   Cc: Colin Dawley, MD,  Patient, No Pcp Per  Aldona Amel, MD  [facility] [address] [place], [place] [address]  Phone [phone] Fax [phone]

## 2024-01-12 ENCOUNTER — Telehealth: Payer: Self-pay | Admitting: Neurology

## 2024-01-12 NOTE — Addendum Note (Signed)
 Addended by: NEYSA NENA RAMAN on: 01/12/2024 01:21 PM   Modules accepted: Orders

## 2024-01-12 NOTE — Telephone Encounter (Signed)
 Referral for sleep studies sent through EPIC to [facility].

## 2024-01-26 ENCOUNTER — Telehealth: Payer: Self-pay | Admitting: Neurology

## 2024-01-26 NOTE — Telephone Encounter (Signed)
 I called pt and relayed the message that Dr. Ines can not evaluate for the bleeding and recommends to stop the plavix  and aspirin  until she is evaluated and proceed to ED to be evaluated ASAP.  She is at increased risk for stroke while off the plavix  and aspirin  but  bleedng can cause significant problems. Pt said ok.  She verbalized understanding.

## 2024-01-26 NOTE — Telephone Encounter (Signed)
 I called pt and she stated that since her stroke 11-2023 she has had a heavy flow (feels like a waterfall) in her menstrual cycle.  She is on DAPT plavix  75mg  and aspirin  81mg .  She did not mention when in for appt 01-09-2024.  She said that last night she went to bathroom, when she went to get up she passed out.  Her husband/daughter found her call EMS.  She did not go to ED. She did not hurt herself.  She states she is dizzy and weak.  No seizures (she says she does not have seizures)  (it is on her past medical hx 2016).  She states that she feels like it is due to her loss of blood that she is dizzy/weak.  I relayed that she would need to be seen by her pcp. She said she does not have one, she has no insurance.  Has applied for medicaid (has not heard back about this).  I told her that ED/urgent care can do lab work see if anemic.  (It is out of our expertise).  She is drinking / hydrating.  I told her will check and call her back.

## 2024-01-26 NOTE — Telephone Encounter (Signed)
 Pt reports she had to have ambulance called alst night as a result of her losing consciousness, pt believes this is as a result of being on blood thinners since her stroke and now being on her cycle, pt is asking for a call from RN to discuss.

## 2024-01-26 NOTE — Telephone Encounter (Signed)
 I cannot evaluate her for beeding. She has to go to the emergency room. I would ask her to hold the plavix  and aspirin  until she is evaluated. Unfortunately she is at increased risk for stroke while off of the aspirin  and plavix  but the bleeding can cause significant problems so I would proceed to the emergency room to be evaluated as soon as possible s she can be advised on how to proceed with the aspirin  or plavix .

## 2024-02-10 ENCOUNTER — Institutional Professional Consult (permissible substitution): Payer: Self-pay | Admitting: Neurology

## 2024-02-18 ENCOUNTER — Ambulatory Visit: Payer: MEDICAID | Attending: Neurology

## 2024-02-18 DIAGNOSIS — R002 Palpitations: Secondary | ICD-10-CM

## 2024-02-18 DIAGNOSIS — I6310 Cerebral infarction due to embolism of unspecified precerebral artery: Secondary | ICD-10-CM

## 2024-02-25 ENCOUNTER — Ambulatory Visit: Payer: Self-pay | Admitting: Neurology

## 2024-02-25 NOTE — Telephone Encounter (Signed)
 Patient's cardiac monitor was negative for cause of stroke. Since we have not found an etiology, would recommend proceeding with referral to cardiology for TEE (transesophageal echocardiogram) and loop recorder as we discussed at appointment. If she is willing, let me know and I can place referral.See phone note, please call patient. Thank you

## 2024-03-03 NOTE — Telephone Encounter (Signed)
 Relayed results to pt and she verbalized understanding of plan.  Appreciated call back.

## 2024-03-03 NOTE — Addendum Note (Signed)
 Addended by: NEYSA NENA RAMAN on: 03/03/2024 04:13 PM   Modules accepted: Orders

## 2024-03-03 NOTE — Telephone Encounter (Signed)
-----   Message from Onetha KATHEE Epp sent at 02/25/2024  4:52 PM EDT ----- Patient's cardiac monitor was negative for cause of stroke. Since we have not found an etiology, would recommend proceeding with referral to cardiology for TEE (transesophageal echocardiogram) and  loop recorder as we discussed at appointment. If she is willing, let me know and I can place referral.See phone note, please call patient. Thank you ----- Message ----- From: Verlin Lonni BIRCH, MD Sent: 02/18/2024   3:16 PM EDT To: Onetha KATHEE Epp, MD

## 2024-03-08 ENCOUNTER — Other Ambulatory Visit: Payer: Self-pay | Admitting: Neurology

## 2024-03-08 DIAGNOSIS — I6310 Cerebral infarction due to embolism of unspecified precerebral artery: Secondary | ICD-10-CM

## 2024-03-20 ENCOUNTER — Encounter (HOSPITAL_COMMUNITY): Payer: Self-pay | Admitting: *Deleted

## 2024-03-20 ENCOUNTER — Emergency Department (HOSPITAL_COMMUNITY): Payer: Self-pay

## 2024-03-20 ENCOUNTER — Observation Stay (HOSPITAL_COMMUNITY)
Admission: EM | Admit: 2024-03-20 | Discharge: 2024-03-22 | Disposition: A | Discharge status: Home Health | Payer: Self-pay | Attending: Family Medicine | Admitting: Family Medicine

## 2024-03-20 ENCOUNTER — Other Ambulatory Visit: Payer: Self-pay

## 2024-03-20 DIAGNOSIS — D500 Iron deficiency anemia secondary to blood loss (chronic): Secondary | ICD-10-CM | POA: Insufficient documentation

## 2024-03-20 DIAGNOSIS — E785 Hyperlipidemia, unspecified: Secondary | ICD-10-CM | POA: Insufficient documentation

## 2024-03-20 DIAGNOSIS — D529 Folate deficiency anemia, unspecified: Secondary | ICD-10-CM | POA: Insufficient documentation

## 2024-03-20 DIAGNOSIS — D649 Anemia, unspecified: Principal | ICD-10-CM

## 2024-03-20 DIAGNOSIS — I6340 Cerebral infarction due to embolism of unspecified cerebral artery: Secondary | ICD-10-CM

## 2024-03-20 DIAGNOSIS — Z7982 Long term (current) use of aspirin: Secondary | ICD-10-CM | POA: Insufficient documentation

## 2024-03-20 DIAGNOSIS — I10 Essential (primary) hypertension: Secondary | ICD-10-CM | POA: Diagnosis present

## 2024-03-20 DIAGNOSIS — N920 Excessive and frequent menstruation with regular cycle: Secondary | ICD-10-CM | POA: Insufficient documentation

## 2024-03-20 DIAGNOSIS — I63531 Cerebral infarction due to unspecified occlusion or stenosis of right posterior cerebral artery: Principal | ICD-10-CM | POA: Insufficient documentation

## 2024-03-20 DIAGNOSIS — E538 Deficiency of other specified B group vitamins: Secondary | ICD-10-CM | POA: Insufficient documentation

## 2024-03-20 DIAGNOSIS — Z6841 Body Mass Index (BMI) 40.0 and over, adult: Secondary | ICD-10-CM | POA: Insufficient documentation

## 2024-03-20 DIAGNOSIS — I639 Cerebral infarction, unspecified: Secondary | ICD-10-CM | POA: Diagnosis present

## 2024-03-20 LAB — DIFFERENTIAL
Abs Immature Granulocytes: 0.05 K/uL (ref 0.00–0.07)
Basophils Absolute: 0.1 K/uL (ref 0.0–0.1)
Basophils Relative: 1 %
Eosinophils Absolute: 0.1 K/uL (ref 0.0–0.5)
Eosinophils Relative: 2 %
Immature Granulocytes: 1 %
Lymphocytes Relative: 23 %
Lymphs Abs: 1.5 K/uL (ref 0.7–4.0)
Monocytes Absolute: 0.5 K/uL (ref 0.1–1.0)
Monocytes Relative: 7 %
Neutro Abs: 4.1 K/uL (ref 1.7–7.7)
Neutrophils Relative %: 66 %
Smear Review: NORMAL

## 2024-03-20 LAB — IRON AND TIBC
Iron: 10 ug/dL — ABNORMAL LOW (ref 28–170)
Saturation Ratios: 2 % — ABNORMAL LOW (ref 10.4–31.8)
TIBC: 493 ug/dL — ABNORMAL HIGH (ref 250–450)
UIBC: 483 ug/dL

## 2024-03-20 LAB — COMPREHENSIVE METABOLIC PANEL WITH GFR
ALT: 13 U/L (ref 0–44)
AST: 17 U/L (ref 15–41)
Albumin: 3.1 g/dL — ABNORMAL LOW (ref 3.5–5.0)
Alkaline Phosphatase: 82 U/L (ref 38–126)
Anion gap: 11 (ref 5–15)
BUN: 7 mg/dL (ref 6–20)
CO2: 26 mmol/L (ref 22–32)
Calcium: 8.6 mg/dL — ABNORMAL LOW (ref 8.9–10.3)
Chloride: 103 mmol/L (ref 98–111)
Creatinine, Ser: 0.61 mg/dL (ref 0.44–1.00)
GFR, Estimated: >60 mL/min (ref >60)
Glucose, Bld: 109 mg/dL — ABNORMAL HIGH (ref 70–99)
Potassium: 3.6 mmol/L (ref 3.5–5.1)
Sodium: 140 mmol/L (ref 135–145)
Total Bilirubin: 1 mg/dL (ref 0.0–1.2)
Total Protein: 6.6 g/dL (ref 6.5–8.1)

## 2024-03-20 LAB — RAPID URINE DRUG SCREEN, HOSP PERFORMED
Amphetamines: NOT DETECTED
Barbiturates: NOT DETECTED
Benzodiazepines: NOT DETECTED
Cocaine: NOT DETECTED
Opiates: NOT DETECTED
Tetrahydrocannabinol: NOT DETECTED

## 2024-03-20 LAB — PREGNANCY, URINE: Preg Test, Ur: NEGATIVE

## 2024-03-20 LAB — PREPARE RBC (CROSSMATCH)

## 2024-03-20 LAB — CBC
HCT: 19.5 % — ABNORMAL LOW (ref 36.0–46.0)
Hemoglobin: 4.8 g/dL — CL (ref 12.0–15.0)
MCH: 16.7 pg — ABNORMAL LOW (ref 26.0–34.0)
MCHC: 24.6 g/dL — ABNORMAL LOW (ref 30.0–36.0)
MCV: 67.7 fL — ABNORMAL LOW (ref 80.0–100.0)
Platelets: 316 K/uL (ref 150–400)
RBC: 2.88 MIL/uL — ABNORMAL LOW (ref 3.87–5.11)
RDW: 22.5 % — ABNORMAL HIGH (ref 11.5–15.5)
WBC: 6.2 K/uL (ref 4.0–10.5)
nRBC: 0.5 % — ABNORMAL HIGH (ref 0.0–0.2)

## 2024-03-20 LAB — FOLATE: Folate: 3.2 ng/mL — ABNORMAL LOW (ref >5.9)

## 2024-03-20 LAB — POC OCCULT BLOOD, ED: Fecal Occult Bld: NEGATIVE

## 2024-03-20 LAB — FERRITIN: Ferritin: <3 ng/mL — ABNORMAL LOW (ref 11–307)

## 2024-03-20 LAB — PROTIME-INR
INR: 1 (ref 0.8–1.2)
Prothrombin Time: 13.6 s (ref 11.4–15.2)

## 2024-03-20 LAB — ABO/RH: ABO/RH(D): O POS

## 2024-03-20 LAB — ETHANOL: Alcohol, Ethyl (B): <15 mg/dL (ref <15)

## 2024-03-20 LAB — CBG MONITORING, ED: Glucose-Capillary: 106 mg/dL — ABNORMAL HIGH (ref 70–99)

## 2024-03-20 LAB — VITAMIN B12: Vitamin B-12: <150 pg/mL — ABNORMAL LOW (ref 180–914)

## 2024-03-20 MED ORDER — ACETAMINOPHEN 160 MG/5ML PO SOLN: 650.0000 mg | ORAL | Status: DC | PRN

## 2024-03-20 MED ORDER — ACETAMINOPHEN 325 MG PO TABS: 650.0000 mg | ORAL_TABLET | ORAL | Status: DC | PRN

## 2024-03-20 MED ORDER — SENNOSIDES-DOCUSATE SODIUM 8.6-50 MG PO TABS: 1.0000 | ORAL_TABLET | Freq: Every evening | ORAL | Status: DC | PRN

## 2024-03-20 MED ORDER — ACETAMINOPHEN 325 MG PO TABS
650.0000 mg | ORAL_TABLET | Freq: Once | ORAL | Status: AC
  Administered 2024-03-20: 650 mg via ORAL
  Filled 2024-03-20: qty 2

## 2024-03-20 MED ORDER — FOLIC ACID 1 MG PO TABS
2.0000 mg | ORAL_TABLET | Freq: Every day | ORAL | Status: DC
  Administered 2024-03-20 – 2024-03-22 (×3): 2 mg via ORAL
  Filled 2024-03-20 (×3): qty 2

## 2024-03-20 MED ORDER — ATORVASTATIN CALCIUM 40 MG PO TABS
40.0000 mg | ORAL_TABLET | Freq: Every day | ORAL | Status: DC
  Administered 2024-03-20 – 2024-03-22 (×3): 40 mg via ORAL
  Filled 2024-03-20 (×3): qty 1

## 2024-03-20 MED ORDER — SODIUM CHLORIDE 0.9% IV SOLUTION: Freq: Once | INTRAVENOUS | Status: AC

## 2024-03-20 MED ORDER — FUROSEMIDE 10 MG/ML IJ SOLN
20.0000 mg | Freq: Once | INTRAMUSCULAR | Status: AC
  Administered 2024-03-20: 20 mg via INTRAVENOUS
  Filled 2024-03-20: qty 4

## 2024-03-20 MED ORDER — ACETAMINOPHEN 650 MG RE SUPP: 650.0000 mg | RECTAL | Status: DC | PRN

## 2024-03-20 MED ORDER — CYANOCOBALAMIN 1000 MCG/ML IJ SOLN
1000.0000 ug | Freq: Every day | INTRAMUSCULAR | Status: DC
  Administered 2024-03-20 – 2024-03-22 (×3): 1000 ug via SUBCUTANEOUS
  Filled 2024-03-20 (×3): qty 1

## 2024-03-20 MED ORDER — CYANOCOBALAMIN 1000 MCG/ML IJ SOLN: 1000.0000 ug | INTRAMUSCULAR | Status: DC

## 2024-03-20 MED ORDER — STROKE: EARLY STAGES OF RECOVERY BOOK
Freq: Once | Status: AC
  Filled 2024-03-20 (×2): qty 1

## 2024-03-20 MED ORDER — ASPIRIN 81 MG PO TBEC
81.0000 mg | DELAYED_RELEASE_TABLET | Freq: Every day | ORAL | Status: DC
  Administered 2024-03-21 – 2024-03-22 (×2): 81 mg via ORAL
  Filled 2024-03-20 (×2): qty 1

## 2024-03-20 MED ORDER — SODIUM CHLORIDE 0.9 % IV SOLN: INTRAVENOUS | Status: AC

## 2024-03-20 MED ORDER — GADOBUTROL 1 MMOL/ML IV SOLN
10.0000 mL | Freq: Once | INTRAVENOUS | Status: AC | PRN
  Administered 2024-03-20: 10 mL via INTRAVENOUS

## 2024-03-20 NOTE — ED Notes (Signed)
 Assisted pt to bedpan and pericare done

## 2024-03-20 NOTE — ED Notes (Signed)
 CBG 106 in triage

## 2024-03-20 NOTE — H&P (Addendum)
 [facility] H&P   Patient Demographics:    Nicole Travis, is a 43 y.o. female  MRN: [id number]   DOB - [birth year]  Admit Date - 03/20/2024  Outpatient Primary MD for the patient is Patient, No Pcp Per  Referring MD/NP/PA: Dr. Melvenia  Outpatient Specialists: Neurology Dr. Beverley  Patient coming from: Home  Chief Complaint  Patient presents with   Extremity Weakness      HPI:    Nicole Travis  is a 43 y.o. female, with medical history significant of morbid obesity, HTN, prior history of stroke in 2015 with resolved left-sided hemiparesis, hospitalization May 2025 for acute CVA, discharged on dual antiplatelet therapy, statin and amlodipine , 30 days event monitor on discharge with no acute findings (per neurology note on 02/25/2024), patient with heavy menstrual period, have stopped her Plavix  recently, she presents to ED secondary to new findings of left-sided weakness, she woke up 6:00 a.m. yesterday with left-sided weakness, has improved during the day, but reoccurred at her job at 3:30 PM, by the time she came back home her symptoms has much improved, but it does appear her symptoms has worsened and recurrent which prompted her to come to ED .  Does endorse heavy menstrual period since starting aspirin  and Plavix  - Workup in ED was significant for new anemia with hemoglobin of 4.8, MCV of 67.7, she was Hemoccult negative, MRI brain was significant for acute CVA, so [facility] consulted to admit.   Review of systems:     A full 10 point Review of Systems was done, except as stated above, all other Review of Systems were negative.   With Past History of the following :    Past Medical History:  Diagnosis Date   Acute confusional migraine    DUB (dysfunctional uterine bleeding)    Headache    Hemisensory deficit    right   Hypertension    Stroke [facility])    Thalamic pain syndrome        Past Surgical History:  Procedure Laterality Date   CESAREAN SECTION     TUBAL LIGATION        Social History:     Social History   Tobacco Use   Smoking status: Never   Smokeless tobacco: Never  Substance Use Topics   Alcohol use: No       Family History :     Family History  Problem Relation Age of Onset   Cervical cancer Mother    Stroke Father    Breast cancer Sister    Breast cancer Sister    Breast cancer Sister    Heart attack Brother    Stroke Maternal Grandmother    Heart attack Maternal Grandmother       Home Medications:   Prior to Admission medications   Medication Sig Start Date End Date Taking? Authorizing Provider  aspirin  EC 81 MG tablet  Take 1 tablet (81 mg total) by mouth daily with breakfast. - For stroke prevention 11/25/23  Yes Emokpae, Courage, MD  amLODipine  (NORVASC ) 10 MG tablet Take 1 tablet (10 mg total) by mouth daily. For BP Patient not taking: Reported on 03/20/2024 11/24/23 11/23/24  Pearlean Manus, MD  atorvastatin  (LIPITOR) 40 MG tablet Take 1 tablet (40 mg total) by mouth daily. Patient not taking: Reported on 03/20/2024 11/25/23   Pearlean Manus, MD  clopidogrel  (PLAVIX ) 75 MG tablet Take 1 tablet (75 mg total) by mouth daily. Take Aspirin  81 mg daily along with Plavix  75 mg daily for 90 days then after that STOP the Plavix  and continue ONLY Aspirin  81 mg daily indefinitely--for secondary stroke Prevention. Patient not taking: Reported on 03/20/2024 11/25/23   Pearlean Manus, MD     Allergies:     Allergies  Allergen Reactions   Celexa  [Citalopram  Hydrobromide] Anxiety     Physical Exam:   Vitals  Blood pressure (!) 155/90, pulse 74, temperature 98.3 F (36.8 C), temperature source Oral, resp. rate (!) 23, height 5' 2 (1.575 m), weight 127 kg, last menstrual period 03/06/2024, SpO2 100%.   1. General Developed female, laying in bed, no apparent distress  2. Normal affect and insight, Not Suicidal or Homicidal, Awake  Alert, Oriented X 3.  3.  Mild left facial droop, left upper and lower extremity weakness 3-4/5   4. Ears and Eyes appear Normal, Conjunctivae clear, PERRLA. Moist Oral Mucosa.  5. Supple Neck, No JVD, No cervical lymphadenopathy appriciated, No Carotid Bruits.  6. Symmetrical Chest wall movement, Good air movement bilaterally, CTAB.  7. RRR, No Gallops, Rubs or Murmurs, No Parasternal Heave.  8. Positive Bowel Sounds, Abdomen Soft, No tenderness, No organomegaly appriciated,No rebound -guarding or rigidity.  9.  No Cyanosis, Normal Skin Turgor, No Skin Rash or Bruise.  10.  joints appear normal , no effusions    Data Review:    CBC Recent Labs  Lab 03/20/24 1121  WBC 6.2  HGB 4.8*  HCT 19.5*  PLT 316  MCV 67.7*  MCH 16.7*  MCHC 24.6*  RDW 22.5*  LYMPHSABS 1.5  MONOABS 0.5  EOSABS 0.1  BASOSABS 0.1   ------------------------------------------------------------------------------------------------------------------  Chemistries  Recent Labs  Lab 03/20/24 1121  NA 140  K 3.6  CL 103  CO2 26  GLUCOSE 109*  BUN 7  CREATININE 0.61  CALCIUM  8.6*  AST 17  ALT 13  ALKPHOS 82  BILITOT 1.0   ------------------------------------------------------------------------------------------------------------------ estimated creatinine clearance is 113.4 mL/min (by C-G formula based on SCr of 0.61 mg/dL). ------------------------------------------------------------------------------------------------------------------ No results for input(s): TSH, T4TOTAL, T3FREE, THYROIDAB in the last 72 hours.  Invalid input(s): FREET3  Coagulation profile Recent Labs  Lab 03/20/24 1121  INR 1.0   ------------------------------------------------------------------------------------------------------------------- No results for input(s): DDIMER in the last 72  hours. -------------------------------------------------------------------------------------------------------------------  Cardiac Enzymes No results for input(s): CKMB, TROPONINI, MYOGLOBIN in the last 168 hours.  Invalid input(s): CK ------------------------------------------------------------------------------------------------------------------    Component Value Date/Time   BNP 35.0 04/28/2021 1100     ---------------------------------------------------------------------------------------------------------------  Urinalysis No results found for: COLORURINE, APPEARANCEUR, LABSPEC, PHURINE, GLUCOSEU, HGBUR, BILIRUBINUR, KETONESUR, PROTEINUR, UROBILINOGEN, NITRITE, LEUKOCYTESUR  ----------------------------------------------------------------------------------------------------------------   Imaging Results:    MR Brain W and Wo Contrast Result Date: 03/20/2024 CLINICAL DATA:  Neuro deficit, acute, stroke suspected; Ataxia, nontraumatic, cervical pathology suspected EXAM: MRI HEAD WITHOUT AND WITH CONTRAST MRI CERVICAL SPINE WITHOUT AND WITH CONTRAST CONTRAST:  10mL GADAVIST  GADOBUTROL  1 MMOL/ML IV SOLN TECHNIQUE: Multiplanar, multiecho pulse  sequences of the brain and surrounding structures, and cervical spine were obtained without and with intravenous contrast. COMPARISON:  CT head from earlier today. MRI cervical spine 02/23/2015. FINDINGS: MRI HEAD FINDINGS Brain: Multiple small acute or early subacute infarcts in the posterior limb of the right internal capsule, right thalamus, and right midbrain. No significant edema or mass effect. Age advanced T2/FLAIR hyperintensities in the white matter, likely axillary chronic microvascular ischemic disease with multiple remote lacunar infarcts in the corona radiata bilaterally. Remote right PCA territory infarct. No evidence of acute hemorrhage, mass lesion, midline shift or hydrocephalus. No abnormal  enhancement. Vascular: Chronically hypoplastic right intradural vertebral artery. Otherwise, major arterial flow voids are maintained at the skull base. Skull and upper cervical spine: Normal marrow signal. Sinuses/Orbits: Clear sinuses.  No acute orbital findings. Other: No mastoid effusions. MRI CERVICAL SPINE FINDINGS Alignment: Normal. Vertebrae: No marrow edema to suggest acute fracture or discitis/osteomyelitis. No suspicious bone lesions. Cord: Normal cord signal on motion limited assessment. No abnormal cord enhancement. Posterior Fossa, vertebral arteries, paraspinal tissues: The visualized vertebral artery flow voids are maintained in the neck. No visible paraspinal edema. Disc levels: No significant disc protrusion, foraminal stenosis, or canal stenosis. Mild facet arthropathy at multiple levels. IMPRESSION: MRI head: 1. Multiple acute or early subacute infarcts in the right posterior limb internal capsule, thalamus and right midbrain. 2. Remote right PCA territory infarct, multiple remote lacunar infarcts in the corona radiata bilaterally, and age advanced chronic microvascular ischemic disease. MRI cervical spine: No evidence of acute abnormality or significant stenosis. Electronically Signed   By: Gilmore GORMAN Molt M.D.   On: 03/20/2024 13:36   MR Cervical Spine W and Wo Contrast Result Date: 03/20/2024 CLINICAL DATA:  Neuro deficit, acute, stroke suspected; Ataxia, nontraumatic, cervical pathology suspected EXAM: MRI HEAD WITHOUT AND WITH CONTRAST MRI CERVICAL SPINE WITHOUT AND WITH CONTRAST CONTRAST:  10mL GADAVIST  GADOBUTROL  1 MMOL/ML IV SOLN TECHNIQUE: Multiplanar, multiecho pulse sequences of the brain and surrounding structures, and cervical spine were obtained without and with intravenous contrast. COMPARISON:  CT head from earlier today. MRI cervical spine 02/23/2015. FINDINGS: MRI HEAD FINDINGS Brain: Multiple small acute or early subacute infarcts in the posterior limb of the right  internal capsule, right thalamus, and right midbrain. No significant edema or mass effect. Age advanced T2/FLAIR hyperintensities in the white matter, likely axillary chronic microvascular ischemic disease with multiple remote lacunar infarcts in the corona radiata bilaterally. Remote right PCA territory infarct. No evidence of acute hemorrhage, mass lesion, midline shift or hydrocephalus. No abnormal enhancement. Vascular: Chronically hypoplastic right intradural vertebral artery. Otherwise, major arterial flow voids are maintained at the skull base. Skull and upper cervical spine: Normal marrow signal. Sinuses/Orbits: Clear sinuses.  No acute orbital findings. Other: No mastoid effusions. MRI CERVICAL SPINE FINDINGS Alignment: Normal. Vertebrae: No marrow edema to suggest acute fracture or discitis/osteomyelitis. No suspicious bone lesions. Cord: Normal cord signal on motion limited assessment. No abnormal cord enhancement. Posterior Fossa, vertebral arteries, paraspinal tissues: The visualized vertebral artery flow voids are maintained in the neck. No visible paraspinal edema. Disc levels: No significant disc protrusion, foraminal stenosis, or canal stenosis. Mild facet arthropathy at multiple levels. IMPRESSION: MRI head: 1. Multiple acute or early subacute infarcts in the right posterior limb internal capsule, thalamus and right midbrain. 2. Remote right PCA territory infarct, multiple remote lacunar infarcts in the corona radiata bilaterally, and age advanced chronic microvascular ischemic disease. MRI cervical spine: No evidence of acute abnormality or significant stenosis. Electronically Signed  By: Gilmore GORMAN Molt M.D.   On: 03/20/2024 13:36   CT HEAD WO CONTRAST Result Date: 03/20/2024 EXAM: CT HEAD WITHOUT CONTRAST 03/20/2024 10:41:07 AM TECHNIQUE: CT of the head was performed without the administration of intravenous contrast. Automated exposure control, iterative reconstruction, and/or weight  based adjustment of the mA/kV was utilized to reduce the radiation dose to as low as reasonably achievable. COMPARISON: Comparison with CT and MRI 11/22/2021. CLINICAL HISTORY: Neuro deficit, acute, stroke suspected. FINDINGS: BRAIN AND VENTRICLES: New hypodense foci in the periventricular white matter, greater on the left (for example series 3, image 21). Similar abnormal hypodensity in the right occipital and left temporal lobes. Similar hypodensity in the left thalamus. No acute hemorrhage. No evidence of acute infarct. No hydrocephalus. No extra-axial collection. No mass effect or midline shift. ORBITS: No acute abnormality. SINUSES: No acute abnormality. SOFT TISSUES AND SKULL: No acute soft tissue abnormality. No skull fracture. IMPRESSION: 1. New hypodense foci in the periventricular white matter, greater on the left. Differential considerations are new age-indeterminate infarcts versus progression of demyelinating disease. Consider MRI without and with IV contrast. 2. These results were called by telephone at the time of interpretation on 03/20/24 at 10:55 to provider Sherran Barks, PA, who verbally acknowledged these results. Electronically signed by: Norman Gatlin MD 03/20/2024 11:03 AM EDT RP Workstation: [id number]    EKG:  Vent. rate 83 BPM PR interval 164 ms QRS duration 93 ms QT/QTcB 360/423 ms P-R-T axes 26 44 30 Sinus rhythm Minimal ST depression, inferior leads  Assessment & Plan:    Principal Problem:   Acute CVA (cerebrovascular accident) [facility]) Active Problems:   Morbid obesity with BMI of 50.0-59.9, adult (HCC)   HTN (hypertension)     Acute CVA  - With recent hospitalization in May 2025 acute CVA, felt embolic then, she had 30 days event monitor, by reviewing notes on 02/25/2024, there is no reason for stroke on her monitor, and recommendation had been made for cardiology referral for TEE. -Of note she had CT chest/abdomen/pelvis as part of workup of embolic stroke during  recent hospitalization in May 2025 with no acute findings. - Recent CTA head and neck in May 2025 significant for high-grade gnosis or occlusion of proximal right posterior cerebral artery with some collateralization, and distal left P2 segment and basilar artery. - Await further recommendation from neurology regarding repeating CTA head and neck and 2D echo when recently done. - Patient 2D echo in May 2025 echocardiogram without intracardiac thrombus  and   EF is 50 to 55%, grade 1 diastolic dysfunction noted, moderate LVH noted, no aortic stenosis, no mitral stenosis,  - A1c is 5.2, will repeat this admission A1c-5.2 - LDL 104, HDL 52, triglycerides 51, total cholesterol 166 recent admission, will repeat -He is currently on aspirin  at home, she took her dose this morning, will await further recommendations from neurology, adding further antiplatelet therapy due to her profound anemia - 30-day event monitor arranged during recent hospitalization and discharge and by reviewing notes it does not appear there is any cause for stroke on her event monitor .  Symptomatic anemia Microcytic anemia Chronic blood loss anemia due to heavy menstrual period - Will check anemia panel. - Receiving 3 units PRBC. - Hemoccult negative  Addendum B 12 deficiency Folic acid  deficiency: - Will came back significantly low so we will start on supplements  HTN - Reports she stopped her amlodipine  at home as giving her dizziness and low blood pressure, will certainly keep holding  to allow for permissive hypertension .  Hyperlipidemia  - Recent LDL is 104, noncompliant with statin, will resume home Lipitor, will recheck lipid panel .  Morbid obesity - Body mass index is 51.21 kg/m.   DVT Prophylaxis SCDs   AM Labs Ordered, also please review Full Orders  Family Communication: Admission, patients condition and plan of care including tests being ordered have been discussed with the patient, husband and  daughter who indicate understanding and agree with the plan and Code Status.  Code Status full code  Likely DC to home  Consults called: Neurology by ED  Admission status: Inpatient  Time spent in minutes : 70 minutes   Brayton Lye M.D on 03/20/2024 at 4:27 PM   [facility] - Office  [phone]

## 2024-03-20 NOTE — ED Triage Notes (Signed)
 Pt BIB [facility] from home for c/o weakness to left leg and arm  Pt states she noticed yesterday at 6am she had some weakness to the leg but when she tried to get out of bed this am my leg wouldn't work  Pt has had a previous stroke with no deficits  Pt states the last time she felt normal was around 2130 Thursday night

## 2024-03-20 NOTE — Consult Note (Signed)
 NEUROLOGY CONSULT NOTE   Date of service: March 20, 2024 Patient Name: Sola Margolis MRN:  [id number] DOB:  [birth year] Chief Complaint: stroke Requesting Provider: Sherlon Brayton RAMAN, MD  History of Present Illness  Versie Soave is a 43 y.o. female with hx of HTN, HLD, migraines, multiple prior strokes who presents with left sided weakness.    LKW: *** Modified rankin score: {Modified Rankin Scale:21264} IV Thrombolysis: ***Yes, *** No (reason) EVT: ***Yes, *** No (reason) ICH Score:***  NIHSS components Score: Comment  1a Level of Conscious 0[]  1[]  2[]  3[]      1b LOC Questions 0[]  1[]  2[]       1c LOC Commands 0[]  1[]  2[]       2 Best Gaze 0[]  1[]  2[]       3 Visual 0[]  1[]  2[]  3[]      4 Facial Palsy 0[]  1[]  2[]  3[]      5a Motor Arm - left 0[]  1[]  2[]  3[]  4[]  UN[]    5b Motor Arm - Right 0[]  1[]  2[]  3[]  4[]  UN[]    6a Motor Leg - Left 0[]  1[]  2[]  3[]  4[]  UN[]    6b Motor Leg - Right 0[]  1[]  2[]  3[]  4[]  UN[]    7 Limb Ataxia 0[]  1[]  2[]  UN[]      8 Sensory 0[]  1[]  2[]  UN[]      9 Best Language 0[]  1[]  2[]  3[]      10 Dysarthria 0[]  1[]  2[]  UN[]      11 Extinct. and Inattention 0[]  1[]  2[]       TOTAL:       ROS  ***Comprehensive ROS performed and pertinent positives documented in HPI  ***Unable to ascertain due to ***  Past History   Past Medical History:  Diagnosis Date   Acute confusional migraine    DUB (dysfunctional uterine bleeding)    Headache    Hemisensory deficit    right   Hypertension    Stroke [facility])    Thalamic pain syndrome     Past Surgical History:  Procedure Laterality Date   CESAREAN SECTION     TUBAL LIGATION      Family History: Family History  Problem Relation Age of Onset   Cervical cancer Mother    Stroke Father    Breast cancer Sister    Breast cancer Sister    Breast cancer Sister    Heart attack Brother    Stroke Maternal Grandmother    Heart attack Maternal Grandmother     Social History  reports that she has  never smoked. She has never used smokeless tobacco. She reports that she does not drink alcohol and does not use drugs.  Allergies  Allergen Reactions   Celexa  [Citalopram  Hydrobromide] Anxiety    Medications   Current Facility-Administered Medications:    [START ON 03/21/2024]  stroke: early stages of recovery book, , Does not apply, Once, Elgergawy, Brayton RAMAN, MD   0.9 %  sodium chloride  infusion (Manually program via Guardrails IV Fluids), , Intravenous, Once, Elgergawy, Brayton RAMAN, MD   0.9 %  sodium chloride  infusion, , Intravenous, Continuous, Elgergawy, Brayton RAMAN, MD, Last Rate: 40 mL/hr at 03/20/24 1917, New Bag at 03/20/24 8082   acetaminophen  (TYLENOL ) tablet 650 mg, 650 mg, Oral, Q4H PRN **OR** acetaminophen  (TYLENOL ) 160 MG/5ML solution 650 mg, 650 mg, Per Tube, Q4H PRN **OR** acetaminophen  (TYLENOL ) suppository 650 mg, 650 mg, Rectal, Q4H PRN, Elgergawy, Brayton RAMAN, MD   acetaminophen  (TYLENOL ) tablet 650 mg, 650 mg, Oral, Once, Elgergawy, Brayton RAMAN, MD   [  START ON 03/21/2024] aspirin  EC tablet 81 mg, 81 mg, Oral, Q breakfast, Elgergawy, Dawood S, MD   atorvastatin  (LIPITOR) tablet 40 mg, 40 mg, Oral, Daily, Elgergawy, Dawood S, MD, 40 mg at 04-07-2024 1850   cyanocobalamin  (VITAMIN B12) injection 1,000 mcg, 1,000 mcg, Subcutaneous, Daily **FOLLOWED BY** [START ON 03/27/2024] cyanocobalamin  (VITAMIN B12) injection 1,000 mcg, 1,000 mcg, Subcutaneous, Weekly, Elgergawy, Dawood S, MD   folic acid  (FOLVITE ) tablet 2 mg, 2 mg, Oral, Daily, Elgergawy, Dawood S, MD, 2 mg at 2024/04/07 1850   furosemide  (LASIX ) injection 20 mg, 20 mg, Intravenous, Once, Elgergawy, Dawood S, MD   senna-docusate (Senokot-S) tablet 1 tablet, 1 tablet, Oral, QHS PRN, Elgergawy, Brayton RAMAN, MD  Vitals   Vitals:   07-Apr-2024 1710 Apr 07, 2024 1715 04-07-2024 1820 April 07, 2024 1914  BP:  (!) 172/75 (!) 180/109 (!) 196/77  Pulse:  74    Resp:  (!) 22 16 16   Temp:   98.3 F (36.8 C) 98.3 F (36.8 C)  TempSrc:   Oral Oral  SpO2: 100%  95% 100% 99%  Weight:      Height:        Body mass index is 51.21 kg/m.   Physical Exam   Constitutional: Appears well-developed and well-nourished. *** Psych: Affect appropriate to situation. *** Eyes: No scleral injection. *** HENT: No OP obstruction. *** Head: Normocephalic. *** Cardiovascular: Normal rate and regular rhythm. *** Respiratory: Effort normal, non-labored breathing. *** GI: Soft.  No distension. There is no tenderness. *** Skin: WDI. ***  Neurologic Examination   ***  Labs/Imaging/Neurodiagnostic studies   CBC:  Recent Labs  Lab 04-07-24 1121  WBC 6.2  NEUTROABS 4.1  HGB 4.8*  HCT 19.5*  MCV 67.7*  PLT 316   Basic Metabolic Panel:  Lab Results  Component Value Date   NA 140 Apr 07, 2024   K 3.6 April 07, 2024   CO2 26 04-07-2024   GLUCOSE 109 (H) 04-07-2024   BUN 7 07-Apr-2024   CREATININE 0.61 2024/04/07   CALCIUM  8.6 (L) 2024/04/07   GFRNONAA >60 2024-04-07   GFRAA >60 09/22/2017   Lipid Panel:  Lab Results  Component Value Date   LDLCALC 104 (H) 11/23/2023   HgbA1c:  Lab Results  Component Value Date   HGBA1C 5.2 11/23/2023   Urine Drug Screen:     Component Value Date/Time   LABOPIA NONE DETECTED 04/07/2024 1120   COCAINSCRNUR NONE DETECTED 2024-04-07 1120   LABBENZ NONE DETECTED 04/07/2024 1120   AMPHETMU NONE DETECTED Apr 07, 2024 1120   THCU NONE DETECTED 04-07-2024 1120   LABBARB NONE DETECTED 04-07-2024 1120    Alcohol Level     Component Value Date/Time   [facility] <15 2024-04-07 1121   INR  Lab Results  Component Value Date   INR 1.0 2024/04/07   APTT  Lab Results  Component Value Date   APTT 31 11/23/2023   AED levels: No results found for: PHENYTOIN, ZONISAMIDE, LAMOTRIGINE, LEVETIRACETA  CT Head without contrast(Personally reviewed): ***  CT angio Head and Neck with contrast(Personally reviewed): ***  MR Angio head without contrast and Carotid Duplex BL(Personally reviewed): ***  MRI  Brain(Personally reviewed): ***  Neurodiagnostics rEEG:  ***  ASSESSMENT   Davis Vannatter is a 43 y.o. female ***  RECOMMENDATIONS  *** ______________________________________________________________________    Bonney Ellouise Mari, MD [facility]

## 2024-03-20 NOTE — ED Provider Notes (Signed)
 [facility] EMERGENCY DEPARTMENT AT [facility] Provider Note   CSN: [id number] Arrival date & time: 03/20/24  9041     Patient presents with: Extremity Weakness   Nicole Travis is a 43 y.o. female.   HPI Patient presents for clinic symptoms.  Medical history includes HTN, CVA, migraines.  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  She was admitted 3 months ago for CVA.  Symptoms at that time more confusion, right eye vision deficits.  She was started on 90 days of DAPT.  She remains on ASA.  Denies ago, she went to bed at 9:30 PM.  This was her last known well.  She woke up yesterday morning at 6 AM with slurred speech and left-sided weakness.  She has been dragging her left leg when walking.    Prior to Admission medications   Medication Sig Start Date End Date Taking? Authorizing Provider  amLODipine  (NORVASC ) 10 MG tablet Take 1 tablet (10 mg total) by mouth daily. For BP 11/24/23 11/23/24 Yes Emokpae, Courage, MD  atorvastatin  (LIPITOR) 40 MG tablet Take 1 tablet (40 mg total) by mouth daily. 11/25/23  Yes Emokpae, Courage, MD  clopidogrel  (PLAVIX ) 75 MG tablet Take 1 tablet (75 mg total) by mouth daily. Take Aspirin  81 mg daily along with Plavix  75 mg daily for 90 days then after that STOP the Plavix  and continue ONLY Aspirin  81 mg daily indefinitely--for secondary stroke Prevention. 11/25/23  Yes Pearlean Manus, MD  aspirin  EC 81 MG tablet Take 1 tablet (81 mg total) by mouth daily with breakfast. - For stroke prevention 11/25/23   Pearlean Manus, MD    Allergies: Celexa  [citalopram  hydrobromide]    Review of Systems  Neurological:  Positive for facial asymmetry, speech difficulty, weakness and numbness.  All other systems reviewed and are negative.   Updated Vital Signs BP (!) 172/72   Pulse 77   Temp 98.3 F (36.8 C) (Oral)   Resp 17   Ht 5' 2 (1.575 m)   Wt 127 kg   LMP 03/06/2024 (Approximate)   SpO2 99%   BMI 51.21 kg/m    Physical Exam Vitals and nursing note reviewed.  Constitutional:      General: She is not in acute distress.    Appearance: Normal appearance. She is well-developed. She is not ill-appearing, toxic-appearing or diaphoretic.  HENT:     Head: Normocephalic and atraumatic.     Right Ear: External ear normal.     Left Ear: External ear normal.     Nose: Nose normal.     Mouth/Throat:     Mouth: Mucous membranes are moist.  Eyes:     General: No visual field deficit.    Extraocular Movements: Extraocular movements intact.     Conjunctiva/sclera: Conjunctivae normal.  Cardiovascular:     Rate and Rhythm: Normal rate and regular rhythm.  Pulmonary:     Effort: Pulmonary effort is normal. No respiratory distress.  Abdominal:     General: There is no distension.     Palpations: Abdomen is soft.  Musculoskeletal:        General: No swelling. Normal range of motion.     Cervical back: Normal range of motion and neck supple.  Skin:    General: Skin is warm and dry.     Capillary Refill: Capillary refill takes less than 2 seconds.     Coloration: Skin is not jaundiced or pale.  Neurological:     Mental Status:  She is alert and oriented to person, place, and time.     Cranial Nerves: Dysarthria and facial asymmetry present.     Sensory: Sensory deficit (Left-sided face, V 2-3, left upper extremity) present.     Motor: Weakness (Slight weakness to left upper extremity, 4/5 weakness in left lower extremity) present.     Coordination: Coordination is intact. Finger-Nose-Finger Test normal.  Psychiatric:        Mood and Affect: Mood normal.        Behavior: Behavior normal.     (all labs ordered are listed, but only abnormal results are displayed) Labs Reviewed  CBC - Abnormal; Notable for the following components:      Result Value   RBC 2.88 (*)    Hemoglobin 4.8 (*)    HCT 19.5 (*)    MCV 67.7 (*)    MCH 16.7 (*)    MCHC 24.6 (*)    RDW 22.5 (*)    nRBC 0.5 (*)    All other  components within normal limits  COMPREHENSIVE METABOLIC PANEL WITH GFR - Abnormal; Notable for the following components:   Glucose, Bld 109 (*)    Calcium  8.6 (*)    Albumin 3.1 (*)    All other components within normal limits  CBG MONITORING, ED - Abnormal; Notable for the following components:   Glucose-Capillary 106 (*)    All other components within normal limits  PROTIME-INR  DIFFERENTIAL  ETHANOL  RAPID URINE DRUG SCREEN, HOSP PERFORMED  PREGNANCY, URINE  POC OCCULT BLOOD, ED  ABO/RH  TYPE AND SCREEN  PREPARE RBC (CROSSMATCH)    EKG: None  Radiology: MR Brain W and Wo Contrast Result Date: 03/20/2024 CLINICAL DATA:  Neuro deficit, acute, stroke suspected; Ataxia, nontraumatic, cervical pathology suspected EXAM: MRI HEAD WITHOUT AND WITH CONTRAST MRI CERVICAL SPINE WITHOUT AND WITH CONTRAST CONTRAST:  10mL GADAVIST  GADOBUTROL  1 MMOL/ML IV SOLN TECHNIQUE: Multiplanar, multiecho pulse sequences of the brain and surrounding structures, and cervical spine were obtained without and with intravenous contrast. COMPARISON:  CT head from earlier today. MRI cervical spine 02/23/2015. FINDINGS: MRI HEAD FINDINGS Brain: Multiple small acute or early subacute infarcts in the posterior limb of the right internal capsule, right thalamus, and right midbrain. No significant edema or mass effect. Age advanced T2/FLAIR hyperintensities in the white matter, likely axillary chronic microvascular ischemic disease with multiple remote lacunar infarcts in the corona radiata bilaterally. Remote right PCA territory infarct. No evidence of acute hemorrhage, mass lesion, midline shift or hydrocephalus. No abnormal enhancement. Vascular: Chronically hypoplastic right intradural vertebral artery. Otherwise, major arterial flow voids are maintained at the skull base. Skull and upper cervical spine: Normal marrow signal. Sinuses/Orbits: Clear sinuses.  No acute orbital findings. Other: No mastoid effusions. MRI  CERVICAL SPINE FINDINGS Alignment: Normal. Vertebrae: No marrow edema to suggest acute fracture or discitis/osteomyelitis. No suspicious bone lesions. Cord: Normal cord signal on motion limited assessment. No abnormal cord enhancement. Posterior Fossa, vertebral arteries, paraspinal tissues: The visualized vertebral artery flow voids are maintained in the neck. No visible paraspinal edema. Disc levels: No significant disc protrusion, foraminal stenosis, or canal stenosis. Mild facet arthropathy at multiple levels. IMPRESSION: MRI head: 1. Multiple acute or early subacute infarcts in the right posterior limb internal capsule, thalamus and right midbrain. 2. Remote right PCA territory infarct, multiple remote lacunar infarcts in the corona radiata bilaterally, and age advanced chronic microvascular ischemic disease. MRI cervical spine: No evidence of acute abnormality or significant stenosis. Electronically Signed  By: Gilmore GORMAN Molt M.D.   On: 03/20/2024 13:36   MR Cervical Spine W and Wo Contrast Result Date: 03/20/2024 CLINICAL DATA:  Neuro deficit, acute, stroke suspected; Ataxia, nontraumatic, cervical pathology suspected EXAM: MRI HEAD WITHOUT AND WITH CONTRAST MRI CERVICAL SPINE WITHOUT AND WITH CONTRAST CONTRAST:  10mL GADAVIST  GADOBUTROL  1 MMOL/ML IV SOLN TECHNIQUE: Multiplanar, multiecho pulse sequences of the brain and surrounding structures, and cervical spine were obtained without and with intravenous contrast. COMPARISON:  CT head from earlier today. MRI cervical spine 02/23/2015. FINDINGS: MRI HEAD FINDINGS Brain: Multiple small acute or early subacute infarcts in the posterior limb of the right internal capsule, right thalamus, and right midbrain. No significant edema or mass effect. Age advanced T2/FLAIR hyperintensities in the white matter, likely axillary chronic microvascular ischemic disease with multiple remote lacunar infarcts in the corona radiata bilaterally. Remote right PCA territory  infarct. No evidence of acute hemorrhage, mass lesion, midline shift or hydrocephalus. No abnormal enhancement. Vascular: Chronically hypoplastic right intradural vertebral artery. Otherwise, major arterial flow voids are maintained at the skull base. Skull and upper cervical spine: Normal marrow signal. Sinuses/Orbits: Clear sinuses.  No acute orbital findings. Other: No mastoid effusions. MRI CERVICAL SPINE FINDINGS Alignment: Normal. Vertebrae: No marrow edema to suggest acute fracture or discitis/osteomyelitis. No suspicious bone lesions. Cord: Normal cord signal on motion limited assessment. No abnormal cord enhancement. Posterior Fossa, vertebral arteries, paraspinal tissues: The visualized vertebral artery flow voids are maintained in the neck. No visible paraspinal edema. Disc levels: No significant disc protrusion, foraminal stenosis, or canal stenosis. Mild facet arthropathy at multiple levels. IMPRESSION: MRI head: 1. Multiple acute or early subacute infarcts in the right posterior limb internal capsule, thalamus and right midbrain. 2. Remote right PCA territory infarct, multiple remote lacunar infarcts in the corona radiata bilaterally, and age advanced chronic microvascular ischemic disease. MRI cervical spine: No evidence of acute abnormality or significant stenosis. Electronically Signed   By: Gilmore GORMAN Molt M.D.   On: 03/20/2024 13:36   CT HEAD WO CONTRAST Result Date: 03/20/2024 EXAM: CT HEAD WITHOUT CONTRAST 03/20/2024 10:41:07 AM TECHNIQUE: CT of the head was performed without the administration of intravenous contrast. Automated exposure control, iterative reconstruction, and/or weight based adjustment of the mA/kV was utilized to reduce the radiation dose to as low as reasonably achievable. COMPARISON: Comparison with CT and MRI 11/22/2021. CLINICAL HISTORY: Neuro deficit, acute, stroke suspected. FINDINGS: BRAIN AND VENTRICLES: New hypodense foci in the periventricular white matter,  greater on the left (for example series 3, image 21). Similar abnormal hypodensity in the right occipital and left temporal lobes. Similar hypodensity in the left thalamus. No acute hemorrhage. No evidence of acute infarct. No hydrocephalus. No extra-axial collection. No mass effect or midline shift. ORBITS: No acute abnormality. SINUSES: No acute abnormality. SOFT TISSUES AND SKULL: No acute soft tissue abnormality. No skull fracture. IMPRESSION: 1. New hypodense foci in the periventricular white matter, greater on the left. Differential considerations are new age-indeterminate infarcts versus progression of demyelinating disease. Consider MRI without and with IV contrast. 2. These results were called by telephone at the time of interpretation on 03/20/24 at 10:55 to provider Sherran Barks, PA, who verbally acknowledged these results. Electronically signed by: Norman Gatlin MD 03/20/2024 11:03 AM EDT RP Workstation: [id number]     Procedures   Medications Ordered in the ED  gadobutrol  (GADAVIST ) 1 MMOL/ML injection 10 mL (10 mLs Intravenous Contrast Given 03/20/24 1248)  0.9 %  sodium chloride  infusion (Manually program via Guardrails IV  Fluids) (0 mLs Intravenous Stopped 03/20/24 1456)                                    Medical Decision Making Amount and/or Complexity of Data Reviewed Labs: ordered. Radiology: ordered.  Risk Prescription drug management. Decision regarding hospitalization.   This patient presents to the ED for concern of strokelike symptoms, this involves an extensive number of treatment options, and is a complaint that carries with it a high risk of complications and morbidity.  The differential diagnosis includes CVA, recrudescence of prior stroke, neoplasm, autoimmune disorder, seizure, metabolic derangements   Co morbidities / Chronic conditions that complicate the patient evaluation  HTN, CVA, migraines   Additional history obtained:  Additional history  obtained from EMR External records from outside source obtained and reviewed including N/A   Lab Tests:  I Ordered, and personally interpreted labs.  The pertinent results include: Normal kidney function, normal electrolytes, no leukocytosis.  Hemoglobin is markedly decreased from baseline.   Imaging Studies ordered:  I ordered imaging studies including CT head, MRI brain, MRI cervical spine I independently visualized and interpreted imaging which showed multiple acute or early subacute infarcts in the right posterior limb internal capsule, thalamus, and right midbrain. I agree with the radiologist interpretation   Cardiac Monitoring: / EKG:  The patient was maintained on a cardiac monitor.  I personally viewed and interpreted the cardiac monitored which showed an underlying rhythm of: Sinus rhythm   Problem List / ED Course / Critical interventions / Medication management  Patient presented for strokelike symptoms.  She has a history of CVA.  She is currently on a daily 81 mg aspirin .  Her last known well was 36 hours ago.  She woke up yesterday morning at 6 AM with slurred speech and left-sided deficits.  On arrival in the ED, vital signs are normal.  Patient is alert and oriented on exam.  When speaking, there is a facial asymmetry from what appears to be left-sided weakness.  This is difficult to appreciate with smile.  She endorses some diminished sensation to the left side of her face.  Speech does seem slurred.  She has mild diminished in station and weakness to her left upper extremity.  She has more prominent weakness in left lower extremity.  Given timeline, patient is outside of window for code stroke.  Workup was initiated.  On CT scan, patient had new hypodense foci in periventricular white matter, greater on the left.  Concern is for new infarcts versus demyelinating disease.  I spoke with neurologist on-call, Dr. Merrianne, who recommends MRI of cervical spine in addition to  brain.  These were ordered.  Patient's lab work is notable for a hemoglobin of 4.8.  This represents a significant acute drop from prior lab work in May, at which time her hemoglobin was 13.9.  Patient states that she has had heavy menstrual cycles which is typical for her.  After her prior hospitalization she had a menstrual period that lasted for 2.5 weeks.  She denies any other known sources of blood loss.  She denies any dark stools.  Hemoccult was negative, although no stool was present on glove.  3 units of PRBCs was ordered.  MRI brain showed multiple acute or early subacute infarcts in right posterior limb internal capsule, thalamus, and right midbrain.  There were no findings on MRI of cervical spine.  I spoke with  Dr. Lindzen again who request hospital admission to [facility].  Patient will likely need four-vessel angiography.  Patient was admitted for further management. I ordered medication including PRBCs Reevaluation of the patient after these medicines showed that the patient symptomatic anemia I have reviewed the patients home medicines and have made adjustments as needed   Consultations Obtained:  I requested consultation with the neurologist, Dr. Lindzen,  and discussed lab and imaging findings as well as pertinent plan - they recommend: Admission to [facility]   Social Determinants of Health:  Lives independently  CRITICAL CARE Performed by: Bernardino Fireman   Total critical care time: 34 minutes  Critical care time was exclusive of separately billable procedures and treating other patients.  Critical care was necessary to treat or prevent imminent or life-threatening deterioration.  Critical care was time spent personally by me on the following activities: development of treatment plan with patient and/or surrogate as well as nursing, discussions with consultants, evaluation of patient's response to treatment, examination of patient, obtaining history from patient or surrogate,  ordering and performing treatments and interventions, ordering and review of laboratory studies, ordering and review of radiographic studies, pulse oximetry and re-evaluation of patient's condition.     Final diagnoses:  Symptomatic anemia  Cerebrovascular accident (CVA), unspecified mechanism [facility])    ED Discharge Orders     None          Fireman Bernardino, MD 03/20/24 1551

## 2024-03-20 NOTE — Progress Notes (Signed)
   03/20/24 2256  BiPAP/CPAP/SIPAP  $ Non-Invasive Home Ventilator  Initial  $ Face Mask Medium Yes  BiPAP/CPAP/SIPAP Pt Type Adult  BiPAP/CPAP/SIPAP Resmed  Mask Type Full face mask  Dentures removed? Not applicable  Mask Size Medium  Respiratory Rate 18 breaths/min  IPAP 15 cmH20  EPAP 5 cmH2O  FiO2 (%) 21 %  Patient Home Machine No  Patient Home Mask No  Patient Home Tubing No  Auto Titrate Yes  Minimum cmH2O 5 cmH2O  Maximum cmH2O 15 cmH2O  Device Plugged into RED Power Outlet Yes

## 2024-03-21 ENCOUNTER — Inpatient Hospital Stay (HOSPITAL_COMMUNITY): Payer: Self-pay

## 2024-03-21 DIAGNOSIS — I7090 Unspecified atherosclerosis: Secondary | ICD-10-CM

## 2024-03-21 DIAGNOSIS — I63531 Cerebral infarction due to unspecified occlusion or stenosis of right posterior cerebral artery: Secondary | ICD-10-CM

## 2024-03-21 DIAGNOSIS — D649 Anemia, unspecified: Secondary | ICD-10-CM

## 2024-03-21 DIAGNOSIS — R29701 NIHSS score 1: Secondary | ICD-10-CM

## 2024-03-21 DIAGNOSIS — N920 Excessive and frequent menstruation with regular cycle: Secondary | ICD-10-CM

## 2024-03-21 DIAGNOSIS — E785 Hyperlipidemia, unspecified: Secondary | ICD-10-CM

## 2024-03-21 LAB — TYPE AND SCREEN
ABO/RH(D): O POS
Antibody Screen: NEGATIVE
Unit division: 0

## 2024-03-21 LAB — HEMOGLOBIN AND HEMATOCRIT, BLOOD
HCT: 28.2 % — ABNORMAL LOW (ref 36.0–46.0)
Hemoglobin: 8.3 g/dL — ABNORMAL LOW (ref 12.0–15.0)

## 2024-03-21 LAB — BPAM RBC
Blood Product Expiration Date: 202509202359
ISSUE DATE / TIME: 202508302139
Unit Type and Rh: 5100

## 2024-03-21 MED ORDER — THIAMINE MONONITRATE 100 MG PO TABS
100.0000 mg | ORAL_TABLET | Freq: Every day | ORAL | Status: DC
  Administered 2024-03-21 – 2024-03-22 (×2): 100 mg via ORAL
  Filled 2024-03-21 (×2): qty 1

## 2024-03-21 MED ORDER — IOHEXOL 350 MG/ML SOLN
75.0000 mL | Freq: Once | INTRAVENOUS | Status: AC | PRN
  Administered 2024-03-21: 75 mL via INTRAVENOUS

## 2024-03-21 MED ORDER — ENSURE PLUS HIGH PROTEIN PO LIQD: 237.0000 mL | Freq: Two times a day (BID) | ORAL | Status: DC

## 2024-03-21 NOTE — Evaluation (Signed)
 Occupational Therapy Evaluation Patient Details Name: Nicole Travis MRN: [id number] DOB: 1981-05-15 Today's Date: 03/21/2024   History of Present Illness   Pt is 43 yo presenting to [facility] on [facility]/30 due to new findings of L sided weakness which improved but then re-occurred. Pt transferred to [facility]. Pt found to have anemia and acute CVA per MD note. 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 PMH: HTN, CVA 2015 and 2025, heavy menstrual periods.     Clinical Impressions This 43 yo female admitted with above presents to acute OT with PLOF of being totally independent with basic ADLs, IADLs, driving, and working. Currently she is overall at a CGA level for stand pivots without RW and ambulation with a RW and CGA for all basic ADLs. Pt reports she is normally weaker in the mornings when she first gets up. She will continue to benefit from acute OT with no need for follow up.     If plan is discharge home, recommend the following:   A little help with walking and/or transfers;A little help with bathing/dressing/bathroom;Assist for transportation;Assistance with cooking/housework     Functional Status Assessment   Patient has had a recent decline in their functional status and demonstrates the ability to make significant improvements in function in a reasonable and predictable amount of time.     Equipment Recommendations   None recommended by OT      Precautions/Restrictions   Precautions Precautions: Fall Recall of Precautions/Restrictions: Intact Restrictions Weight Bearing Restrictions Per Provider Order: No     Mobility Bed Mobility Overal bed mobility: Needs Assistance Bed Mobility: Rolling, Sidelying to Sit Rolling:  Modified independent (Device/Increase time), Used rails Sidelying to sit: Min assist, Used rails       General bed mobility comments: legs off of bed and trunk partially the way up    Transfers Overall transfer level: Needs assistance Equipment used: Rolling walker (2 wheels) Transfers: Sit to/from Stand, Bed to chair/wheelchair/BSC Sit to Stand: Contact guard assist, Supervision           General transfer comment: CGA to supervision for sit to stand from EOB with verbal cues for safe hand placement and intermittent CGA for safety with transfers from recliner to BSCS      Balance Overall balance assessment: Mild deficits observed, not formally tested                                         ADL either performed or assessed with clinical judgement   ADL Overall ADL's : Needs assistance/impaired                                       General ADL Comments: overall at a CGA level when up on her feet (with and without RW) due to intermittent left side weakness     Vision Baseline Vision/History: 1 Wears glasses (for driving) Patient Visual Report: No change from baseline              Pertinent Vitals/Pain Pain Assessment Pain Assessment: No/denies pain     Extremity/Trunk Assessment Upper Extremity Assessment Upper Extremity Assessment:  (  mild weakness in LUE compared to RUE for shoulder)   Lower Extremity Assessment Lower Extremity Assessment: Overall WFL for tasks assessed   Cervical / Trunk Assessment Cervical / Trunk Assessment: Kyphotic   Communication Communication Communication: Impaired Factors Affecting Communication: Reduced clarity of speech   Cognition Arousal: Alert Behavior During Therapy: WFL for tasks assessed/performed                                         Cueing   Cueing Techniques: Verbal cues              Home Living Family/patient expects to be discharged to:: Private  residence Living Arrangements: Spouse/significant other Available Help at Discharge: Available PRN/intermittently;Family (spouse and  dtr work) Type of Home: House Home Access: Ramped entrance     Home Layout: One level     Bathroom Shower/Tub: Walk-in Pensions consultant: Handicapped height     Home Equipment: Shower seat          Prior Functioning/Environment Prior Level of Function : Independent/Modified Independent;Driving             Mobility Comments: works as a Administrator, sports, normally ambulates without AD ADLs Comments: Independent    OT Problem List: Impaired balance (sitting and/or standing)   OT Treatment/Interventions: Self-care/ADL training;Patient/family education;DME and/or AE instruction;Balance training      OT Goals(Current goals can be found in the care plan section)   Acute Rehab OT Goals Patient Stated Goal: to go home today OT Goal Formulation: With patient Time For Goal Achievement: 04/04/24 Potential to Achieve Goals: Good ADL Goals Pt Will Perform Grooming: Independently;standing Pt Will Perform Upper Body Bathing: Independently;sitting;standing Pt Will Perform Lower Body Bathing: Independently;sit to/from stand Pt Will Perform Upper Body Dressing: Independently;sitting;standing Pt Will Perform Lower Body Dressing: Independently;sit to/from stand Pt Will Transfer to Toilet: Independently;ambulating;regular height toilet;grab bars Pt Will Perform Toileting - Clothing Manipulation and hygiene: Independently;sit to/from stand Pt Will Perform Tub/Shower Transfer: Shower transfer;Independently;ambulating;shower seat (no AD for ambulation) Additional ADL Goal #1: Independent in and OOB for baisc ADLs   OT Frequency:  Min 2X/week    Co-evaluation PT/OT/SLP Co-Evaluation/Treatment: Yes (partial) Reason for Co-Treatment: To address functional/ADL transfers PT goals addressed during session: Mobility/safety with  mobility;Balance OT goals addressed during session: ADL's and self-care;Strengthening/ROM      AM-PAC OT 6 Clicks Daily Activity     Outcome Measure Help from another person eating meals?: None Help from another person taking care of personal grooming?: A Little Help from another person toileting, which includes using toliet, bedpan, or urinal?: A Little Help from another person bathing (including washing, rinsing, drying)?: A Little Help from another person to put on and taking off regular upper body clothing?: A Little Help from another person to put on and taking off regular lower body clothing?: A Little 6 Click Score: 19   End of Session Equipment Utilized During Treatment: Gait belt;Rolling walker (2 wheels) Nurse Communication: Mobility status (via secure chat)  Activity Tolerance: Patient tolerated treatment well Patient left: in chair;with call bell/phone within reach;with chair alarm set  OT Visit Diagnosis: Other abnormalities of gait and mobility (R26.89);Muscle weakness (generalized) (M62.81)                Time: 8864-8794 OT Time Calculation (min): 30 min Charges:  OT General Charges $OT Visit: 1 Visit OT Evaluation $OT Eval Moderate  Complexity: 1 Mod  Cathy L. OT Acute Rehabilitation Services Office [phone]    Rodgers [name] 03/21/2024, 12:21 PM

## 2024-03-21 NOTE — Progress Notes (Addendum)
 STROKE TEAM PROGRESS NOTE    INTERIM HISTORY/SUBJECTIVE  Patient remains hemodynamically stable and afebrile.  She received 3 units of packed red blood cells for hemoglobin of 4.8 last night.  OBJECTIVE  CBC    Component Value Date/Time   WBC 6.2 03/20/2024 1121   RBC 2.88 (L) 03/20/2024 1121   HGB 8.3 (L) 03/21/2024 0708   HGB 12.6 04/28/2017 0920   HCT 28.2 (L) 03/21/2024 0708   HCT 39.4 04/28/2017 0920   PLT 316 03/20/2024 1121   PLT 334 04/28/2017 0920   MCV 67.7 (L) 03/20/2024 1121   MCV 91 04/28/2017 0920   MCH 16.7 (L) 03/20/2024 1121   MCHC 24.6 (L) 03/20/2024 1121   RDW 22.5 (H) 03/20/2024 1121   RDW 14.5 04/28/2017 0920   LYMPHSABS 1.5 03/20/2024 1121   MONOABS 0.5 03/20/2024 1121   EOSABS 0.1 03/20/2024 1121   BASOSABS 0.1 03/20/2024 1121    BMET    Component Value Date/Time   NA 140 03/20/2024 1121   NA 142 02/06/2015 1534   K 3.6 03/20/2024 1121   CL 103 03/20/2024 1121   CO2 26 03/20/2024 1121   GLUCOSE 109 (H) 03/20/2024 1121   BUN 7 03/20/2024 1121   BUN 16 02/06/2015 1534   CREATININE 0.61 03/20/2024 1121   CALCIUM  8.6 (L) 03/20/2024 1121   GFRNONAA >60 03/20/2024 1121    IMAGING past 24 hours CT ANGIO HEAD NECK W WO CM Result Date: 03/21/2024 CLINICAL DATA:  43 year old female with neurologic deficit. MRI yesterday reveals fairly widely scattered nonenhancing lesions with abnormal diffusion in both cerebral hemispheres, right deep gray matter, right midbrain. History of prior strokes. And intracranial atherosclerosis on CTA in May. EXAM: CT ANGIOGRAPHY HEAD AND NECK TECHNIQUE: Multidetector CT imaging of the head and neck was performed using the standard protocol during bolus administration of intravenous contrast. Multiplanar CT image reconstructions and MIPs were obtained to evaluate the vascular anatomy. Carotid stenosis measurements (when applicable) are obtained utilizing NASCET criteria, using the distal internal carotid diameter as the  denominator. RADIATION DOSE REDUCTION: This exam was performed according to the departmental dose-optimization program which includes automated exposure control, adjustment of the mA and/or kV according to patient size and/or use of iterative reconstruction technique. CONTRAST:  75mL OMNIPAQUE  IOHEXOL  350 MG/ML SOLN COMPARISON:  Brain MRI yesterday.  CTA head and neck 11/23/2023. FINDINGS: CTA NECK Skeleton: Carious dentition. Mild C2 spina bifida occulta (normal variant). No acute osseous abnormality identified. Upper chest: Negative; incidental mediastinal lipomatosis. Other neck: Nonvascular neck soft tissue spaces appear negative. Aortic arch: Mildly bovine type arch configuration. No arch atherosclerosis. Right carotid system: Patent with mild tortuosity, no significant plaque. No stenosis. Left carotid system: Patent with mild tortuosity. No convincing plaque. No stenosis. Vertebral arteries: Patent proximal right subclavian artery with no plaque or stenosis. Diminutive right vertebral artery, origin grossly normal. Right vertebral appears diminutive on a congenital basis (series 6, image 215), remains small but patent to the skull base. Proximal left subclavian artery and left vertebral artery origin are normal. Normal caliber left vertebral artery appears patent and normal to the skull base. CTA HEAD Posterior circulation: Diminutive right vertebral artery terminates in PICA. Left vertebral supplies the basilar with patent left PICA origin. No distal vertebral stenosis. Patent basilar artery and AICA origins but beginning at that level there is mild to moderate basilar irregularity and stenosis best seen on series 13, image 31 (also series 6, image 136). Distal basilar remains patent to the SCA origins.  Fetal type left PCA origin redemonstrated. Fetal type left PCA with mild irregularity of the posterior communicating artery. Mild to moderate left P2 and P3 segment irregularity and stenosis as seen on series  13, image 34, stable since May. Contralateral right PCA functionally occluded with very faint reconstituted enhancement (series 13, image 27) with superimposed tandem stenoses. The appearance is stable since May. Anterior circulation: Both ICA siphons are patent. Left siphon mild tortuosity, minimal plaque and no stenosis. Normal left posterior communicating artery origin. Right siphon supraclinoid calcified plaque with mild stenosis on series 9, image 178 is stable. Patent carotid termini. Patent MCA and ACA origins with dominant left A1 again noted. Mild A1 segment irregularity without stenosis. Normal anterior communicating artery. Bilateral ACA branches are patent with mild irregularity, appears stable. MCA M1 segments and MCA bifurcations are patent without stenosis. Bilateral MCA branches appear stable with occasional mild M2 irregularity and stenosis such as anterior left division series 13, image 38. Venous sinuses: Patent. 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 Fetal type Left PCA origin. Mildly bovine aortic arch configuration. Review of the MIP images confirms the above findings IMPRESSION: 1. Negative for emergent large vessel occlusion. And no significant extracranial atherosclerosis or stenosis. 2. Positive for advanced intracranial posterior circulation atherosclerosis: - functionally occluded right PCA with minimal reconstitution, stable from May. - mild to moderate tandem Basilar artery and fetal Left PCA irregularity and stenoses. 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 3. Unremarkable Aortic arch (mildly bovine configuration). Electronically Signed   By: VEAR Hurst M.D.   On: 03/21/2024 04:55   MR Brain W and Wo Contrast Result Date: 03/20/2024 CLINICAL DATA:  Neuro deficit, acute, stroke suspected; Ataxia, nontraumatic, cervical pathology suspected EXAM: MRI HEAD WITHOUT AND WITH CONTRAST MRI CERVICAL  SPINE WITHOUT AND WITH CONTRAST CONTRAST:  10mL GADAVIST  GADOBUTROL  1 MMOL/ML IV SOLN TECHNIQUE: Multiplanar, multiecho pulse sequences of the brain and surrounding structures, and cervical spine were obtained without and with intravenous contrast. COMPARISON:  CT head from earlier today. MRI cervical spine 02/23/2015. FINDINGS: MRI HEAD FINDINGS Brain: Multiple small acute or early subacute infarcts in the posterior limb of the right internal capsule, right thalamus, and right midbrain. No significant edema or mass effect. Age advanced T2/FLAIR hyperintensities in the white matter, likely axillary chronic microvascular ischemic disease with multiple remote lacunar infarcts in the corona radiata bilaterally. Remote right PCA territory infarct. No evidence of acute hemorrhage, mass lesion, midline shift or hydrocephalus. No abnormal enhancement. Vascular: Chronically hypoplastic right intradural vertebral artery. Otherwise, major arterial flow voids are maintained at the skull base. Skull and upper cervical spine: Normal marrow signal. Sinuses/Orbits: Clear sinuses.  No acute orbital findings. Other: No mastoid effusions. MRI CERVICAL SPINE FINDINGS Alignment: Normal. Vertebrae: No marrow edema to suggest acute fracture or discitis/osteomyelitis. No suspicious bone lesions. Cord: Normal cord signal on motion limited assessment. No abnormal cord enhancement. Posterior Fossa, vertebral arteries, paraspinal tissues: The visualized vertebral artery flow voids are maintained in the neck. No visible paraspinal edema. Disc levels: No significant disc protrusion, foraminal stenosis, or canal stenosis. Mild facet arthropathy at multiple levels. IMPRESSION: MRI head: 1. Multiple acute or early subacute infarcts in the right posterior limb internal capsule, thalamus and right midbrain. 2. Remote right PCA territory infarct, multiple remote lacunar infarcts in the corona radiata bilaterally, and age advanced chronic  microvascular ischemic disease. MRI cervical spine: No evidence of acute abnormality or significant stenosis. Electronically Signed  By: Gilmore GORMAN Molt M.D.   On: 03/20/2024 13:36   MR Cervical Spine W and Wo Contrast Result Date: 03/20/2024 CLINICAL DATA:  Neuro deficit, acute, stroke suspected; Ataxia, nontraumatic, cervical pathology suspected EXAM: MRI HEAD WITHOUT AND WITH CONTRAST MRI CERVICAL SPINE WITHOUT AND WITH CONTRAST CONTRAST:  10mL GADAVIST  GADOBUTROL  1 MMOL/ML IV SOLN TECHNIQUE: Multiplanar, multiecho pulse sequences of the brain and surrounding structures, and cervical spine were obtained without and with intravenous contrast. COMPARISON:  CT head from earlier today. MRI cervical spine 02/23/2015. FINDINGS: MRI HEAD FINDINGS Brain: Multiple small acute or early subacute infarcts in the posterior limb of the right internal capsule, right thalamus, and right midbrain. No significant edema or mass effect. Age advanced T2/FLAIR hyperintensities in the white matter, likely axillary chronic microvascular ischemic disease with multiple remote lacunar infarcts in the corona radiata bilaterally. Remote right PCA territory infarct. No evidence of acute hemorrhage, mass lesion, midline shift or hydrocephalus. No abnormal enhancement. Vascular: Chronically hypoplastic right intradural vertebral artery. Otherwise, major arterial flow voids are maintained at the skull base. Skull and upper cervical spine: Normal marrow signal. Sinuses/Orbits: Clear sinuses.  No acute orbital findings. Other: No mastoid effusions. MRI CERVICAL SPINE FINDINGS Alignment: Normal. Vertebrae: No marrow edema to suggest acute fracture or discitis/osteomyelitis. No suspicious bone lesions. Cord: Normal cord signal on motion limited assessment. No abnormal cord enhancement. Posterior Fossa, vertebral arteries, paraspinal tissues: The visualized vertebral artery flow voids are maintained in the neck. No visible paraspinal edema.  Disc levels: No significant disc protrusion, foraminal stenosis, or canal stenosis. Mild facet arthropathy at multiple levels. IMPRESSION: MRI head: 1. Multiple acute or early subacute infarcts in the right posterior limb internal capsule, thalamus and right midbrain. 2. Remote right PCA territory infarct, multiple remote lacunar infarcts in the corona radiata bilaterally, and age advanced chronic microvascular ischemic disease. MRI cervical spine: No evidence of acute abnormality or significant stenosis. Electronically Signed   By: Gilmore GORMAN Molt M.D.   On: 03/20/2024 13:36    Vitals:   03/20/24 2311 03/21/24 0052 03/21/24 0307 03/21/24 0900  BP: (!) 150/80 (!) 177/89 (!) 166/82 (!) 180/90  Pulse: 65 73 62 66  Resp: 18 18 18 18   Temp: 98.4 F (36.9 C) 98.2 F (36.8 C) 99 F (37.2 C) 97.7 F (36.5 C)  TempSrc: Oral Oral Oral Oral  SpO2: 98% 100% 95% 99%  Weight:      Height:         PHYSICAL EXAM General:  Alert, well-nourished, well-developed patient in no acute distress Psych:  Mood and affect appropriate for situation CV: Regular rate and rhythm on monitor Respiratory:  Regular, unlabored respirations on room air  NEURO:  Mental Status: AA&Ox3, patient is able to give clear and coherent history Speech/Language: speech is without dysarthria or aphasia.    Cranial Nerves:  II: PERRL. Visual fields full.  III, IV, VI: EOMI. Eyelids elevate symmetrically.  V: Sensation is intact to light touch and diminished on the left VII: Face is symmetrical resting and smiling VIII: hearing intact to voice. IX, X: Phonation is normal.  KP:Dynloizm shrug 5/5. XII: tongue is midline without fasciculations. Motor: Able to move all 4 extremities with good antigravity strength Tone: is normal and bulk is normal Sensation- Intact to light touch bilaterally.  Coordination: FTN intact bilaterally Gait- deferred  Most Recent NIH  1a Level of Conscious.: 0 1b LOC Questions: 0 1c LOC  Commands: 0 2 Best Gaze: 0 3 Visual: 0 4 Facial Palsy:  0 5a Motor Arm - left: 0 5b Motor Arm - Right: 0 6a Motor Leg - Left: 0 6b Motor Leg - Right: 0 7 Limb Ataxia: 0 8 Sensory: 1 9 Best Language: 0 10 Dysarthria: 0 11 Extinct. and Inatten.: 0 TOTAL: 1   ASSESSMENT/PLAN  Ms. Nicole Travis is a 43 y.o. female with history of hypertension, hyperlipidemia, migraines and multiple prior strokes admitted for acute onset left-sided weakness and sensory deficit.  MRI brain reveals acute to early subacute infarcts in the right posterior limb of the internal capsule, thalamus and right midbrain.  Hemoglobin was noted to be 4.8 on arrival, and patient was given transfusion of 3 units of packed red blood cells.  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 Had significant menorrhagia recently with Plavix . May consider pletal for long term DAPT in the future given significant intracranial stenosis Therapy recommendations:  HH PT Disposition: Pending  Hx of Stroke 11/2023 admitted for confusion and memory deficit.  CT showed R occipital, left  temporal and left CR infarcts.  MRI showed left PCA territory infarct, chronic right occipital and left CR infarcts.  CTA head and neck right PCA occlusion, left P2 moderate stenosis, lateral artery moderate stenosis.  Pan CT negative for malignancy.  EF 70 to 75%.  LDL 104, A1c 5.2, UDS negative.  Discharged on DAPT for 3 weeks and Lipitor 40. Follow-up with Dr. Ines at [facility], hypercoagulable workup negative, 30-day CardioNet monitoring no A-fib.    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 In 11/2023, patient started on aspirin  and Plavix  and then she had 1 month of severe daily menorrhagia.  Patient with pale and weak at the time, but family thought she might be deconditioned from stroke. After plavix  completed, her vaginal bleeding stopped and back to her baseline heavy period monthly Hb 4.8 on admission, transfused with 3 units of packed red blood cells, hemoglobin now 8.3 Anemia profile shows low iron and ferritin, supplementation per primary team B12 less than 150, will begin supplementation Folate 3.2, will begin supplementation Empirically supplement thiamine  as well  Hypertension Home meds: Amlodipine  10 Stable Monitor BP goal normotensive  Hyperlipidemia Home meds: Atorvastatin  40 mg daily but not taking at home LDL 104, goal < 70 Resume Lipitor 40 Continue statin at discharge  Other Stroke Risk Factors Obesity, Body mass  index is 51.21 kg/m., BMI >/= 30 associated with increased stroke risk, recommend weight  loss, diet and exercise as appropriate  Family hx stroke  Migraine, controlled, follow with Dr. Ines at [facility]  Other Active Problems Menorrhagia-recommend outpatient gynecology follow-up History of seizure  Hospital day # 1  Patient seen by NP and then by MD, MD to edit note as needed. Cortney E Everitt Clint Kill , MSN, AGACNP-BC [facility] See Amion for schedule and pager information 03/21/2024 11:53 AM  ATTENDING NOTE: I reviewed above note and agree with the assessment and plan. Pt was seen and examined.   Husband and daughter are at the bedside. Pt sitting in chair having lunch. She is awake, alert, eyes open, orientated to age, place, time and people. No aphasia, fluent language but mild to moderate dysarthria, following all simple commands. Able to name and repeat in dysarthric voice. No gaze palsy, tracking bilaterally, visual field full. Left facial droop. Tongue midline. RUE proximal 4+/5, distal hand grip 5/5, LUE proximal 3+/5, distal hand grip 5-/5. RLE 4/5 proximal and 5/5 distally, LLE 3/5 proximal and 4/5 distally. Sensation symmetrical bilaterally, b/l FTN intact but slow on the left, gait not tested.   Patient stroke consistent with right PCA P1 penetrating branch territory infarcts, likely due to functional right PCA occlusion in the setting of severe anemia.  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  However, if anemia much improved may consider aspirin  and Pletal in the future for severe intracranial stenosis.  Stroke risk factor modification.  Continue statin.  Avoid low BP.  For detailed assessment and plan, please refer to above as I have made changes wherever appropriate.   Neurology will sign off. Please call with questions. Pt will  follow up with Dr. Ines at [facility] in about 4 weeks. Thanks for the consult.   Ary Cummins, MD PhD Stroke Neurology 03/21/2024 2:00 PM      To contact Stroke Continuity provider, please refer to [email or web address]. After hours, contact General Neurology

## 2024-03-21 NOTE — Plan of Care (Signed)
  Problem: Pain Managment: Goal: General experience of comfort will improve and/or be controlled Outcome: Progressing   Problem: Safety: Goal: Ability to remain free from injury will improve Outcome: Progressing

## 2024-03-21 NOTE — Progress Notes (Signed)
 PROGRESS NOTE    Nicole Travis  FMW:[id number] DOB: 1980-12-20 DOA: 03/20/2024 PCP: Patient, No Pcp Per   Brief Narrative:  Nicole Travis  is a 43 y.o. female, with medical history significant of morbid obesity, HTN, prior history of stroke in 2015 with resolved left-sided hemiparesis, hospitalization May 2025 for acute CVA, discharged on dual antiplatelet therapy, statin and amlodipine , 30 days event monitor on discharge with no acute findings (per neurology note on 02/25/2024), patient with heavy menstrual period, have stopped her Plavix  recently, she presented to [facility] ED secondary to new findings of left-sided weakness, she woke up 6:00 a.m. 03/19/2024 with left-sided weakness, which initially improved during the day, but reoccurred at her job at 3:30 PM, by the time she came back home her symptoms has much improved, but it does appear her symptoms has worsened and recurrent which prompted her to come to ED .  Does endorse heavy menstrual period since starting aspirin  and Plavix . Workup in ED was significant for new anemia with hemoglobin of 4.8, MCV of 67.7, she was Hemoccult negative, MRI brain was significant for acute CVA, patient was transferred to [facility] for neuro consultation and admitted under hospital service.  Assessment & Plan:   Principal Problem:   Acute CVA (cerebrovascular accident) [facility]) Active Problems:   Morbid obesity with BMI of 50.0-59.9, adult (HCC)   HTN (hypertension)  Acute CVA: Recent CTA head and neck in May 2025 significant for high-grade gnosis or occlusion of proximal right posterior cerebral artery with some collateralization, and distal left P2 segment and basilar artery. She had MRI Brain w/o contrast which demonstrated multiple acute or early subacute infarcts in the right posterior limb internal capsule, thalamus and right midbrain. Hypercoagulable workup in the past has been negative.  TTE has been nonrevealing in the past.  She has not had a TEE which was  recommended to her.  CTA head and neck repeated once again during this hospitalization which is negative for emergent LVO or significant extracranial atherosclerosis or stenosis but positive for advanced intracranial posterior circulation atherosclerosis, functionally occluded right PCA with minimal reconstitution which is stable from May.  Mild to moderate tandem basilar artery and fetal left PCA irregularity and stenosis.  Patient has been started on aspirin  per neurology recommendation.  Blood pressure only slightly elevated, will allow permissive hypertension.  To be seen by PT OT.  Further management per neurology.  Hyperlipidemia: Lipid panel done in May 2025 shows LDL of 104.  She is on atorvastatin  40 mg.  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  Posttransfusion hemoglobin 8.3.  She has been started on B12 and folate replenishment as well.  FOBT negative.  She has history of menorrhagia.  Morbid obesity class II: BMI 51.  Weight loss and diet modification counseled.  Diarrhea: Will check C. difficile per hospital policy.  DVT prophylaxis: SCD's Start: 03/20/24 1720   Code Status: Full Code  Family Communication: Daughter and husband present at bedside.  Plan of care discussed with patient in length and he/she verbalized understanding and agreed with it.  Status is: Inpatient Remains inpatient appropriate because: Needs further workup for stroke   Estimated body mass index is 51.21 kg/m as calculated from the following:   Height as of this encounter: 5' 2 (1.575 m).   Weight as of this encounter: 127 kg.    Nutritional Assessment: Body mass  index is 51.21 kg/m.SABRA Seen by dietician.  I agree with the assessment and plan as outlined below: Nutrition Status:        . Skin Assessment: I have examined the patient's skin and I agree with the wound  assessment as performed by the wound care RN as outlined below:    Consultants:  Neurology  Procedures:  None  Antimicrobials:  Anti-infectives (From admission, onward)    None         Subjective: Patient seen and examined, husband and daughter at the bedside.  Patient is sitting at the edge of the bed.  Her speech intermittently gets slurred and patient and family says that her speech has not improved at all.  Also she complains of intermittent weakness of the left upper and lower extremity.  Objective: Vitals:   03/20/24 2203 03/20/24 2311 03/21/24 0052 03/21/24 0307  BP: (!) 162/85 (!) 150/80 (!) 177/89 (!) 166/82  Pulse: 73 65 73 62  Resp: 18 18 18 18   Temp: 98.3 F (36.8 C) 98.4 F (36.9 C) 98.2 F (36.8 C) 99 F (37.2 C)  TempSrc: Oral Oral Oral Oral  SpO2: 95% 98% 100% 95%  Weight:      Height:        Intake/Output Summary (Last 24 hours) at 03/21/2024 0756 Last data filed at 03/21/2024 0400 Gross per 24 hour  Intake 1722.65 ml  Output 250 ml  Net 1472.65 ml   Filed Weights   03/20/24 1020  Weight: 127 kg    Examination:  General exam: Appears calm and comfortable  Respiratory system: Clear to auscultation. Respiratory effort normal. Cardiovascular system: S1 & S2 heard, RRR. No JVD, murmurs, rubs, gallops or clicks. No pedal edema. Gastrointestinal system: Abdomen is nondistended, soft and nontender. No organomegaly or masses felt. Normal bowel sounds heard. Central nervous system: Alert and oriented.  Expressive aphasia.  4/5 power in left upper and lower extremity.  Left facial droop. Extremities: Symmetric 5 x 5 power. Skin: No rashes, lesions or ulcers Psychiatry: Judgement and insight appear normal. Mood & affect appropriate.    Data Reviewed: I have personally reviewed following labs and imaging studies  CBC: Recent Labs  Lab 03/20/24 1121 03/21/24 0708  WBC 6.2  --   NEUTROABS 4.1  --   HGB 4.8* 8.3*  HCT 19.5* 28.2*  MCV 67.7*  --    PLT 316  --    Basic Metabolic Panel: Recent Labs  Lab 03/20/24 1121  NA 140  K 3.6  CL 103  CO2 26  GLUCOSE 109*  BUN 7  CREATININE 0.61  CALCIUM  8.6*   GFR: Estimated Creatinine Clearance: 113.4 mL/min (by C-G formula based on SCr of 0.61 mg/dL). Liver Function Tests: Recent Labs  Lab 03/20/24 1121  AST 17  ALT 13  ALKPHOS 82  BILITOT 1.0  PROT 6.6  ALBUMIN 3.1*   No results for input(s): LIPASE, AMYLASE in the last 168 hours. No results for input(s): AMMONIA in the last 168 hours. Coagulation Profile: Recent Labs  Lab 03/20/24 1121  INR 1.0   Cardiac Enzymes: No results for input(s): CKTOTAL, CKMB, CKMBINDEX, TROPONINI in the last 168 hours. BNP (last 3 results) No results for input(s): PROBNP in the last 8760 hours. HbA1C: No results for input(s): HGBA1C in the last 72 hours. CBG: Recent Labs  Lab 03/20/24 1006  GLUCAP 106*   Lipid Profile: No results for input(s): CHOL, HDL, LDLCALC, TRIG, CHOLHDL, LDLDIRECT in the last 72 hours. Thyroid  Function  Tests: No results for input(s): TSH, T4TOTAL, FREET4, T3FREE, THYROIDAB in the last 72 hours. Anemia Panel: Recent Labs    03/20/24 1140  VITAMINB12 <150*  FOLATE 3.2*  FERRITIN <3*  TIBC 493*  IRON 10*   Sepsis Labs: No results for input(s): PROCALCITON, LATICACIDVEN in the last 168 hours.  No results found for this or any previous visit (from the past 240 hours).   Radiology Studies: CT ANGIO HEAD NECK W WO CM Result Date: 03/21/2024 CLINICAL DATA:  43 year old female with neurologic deficit. MRI yesterday reveals fairly widely scattered nonenhancing lesions with abnormal diffusion in both cerebral hemispheres, right deep gray matter, right midbrain. History of prior strokes. And intracranial atherosclerosis on CTA in May. EXAM: CT ANGIOGRAPHY HEAD AND NECK TECHNIQUE: Multidetector CT imaging of the head and neck was performed using the standard  protocol during bolus administration of intravenous contrast. Multiplanar CT image reconstructions and MIPs were obtained to evaluate the vascular anatomy. Carotid stenosis measurements (when applicable) are obtained utilizing NASCET criteria, using the distal internal carotid diameter as the denominator. RADIATION DOSE REDUCTION: This exam was performed according to the departmental dose-optimization program which includes automated exposure control, adjustment of the mA and/or kV according to patient size and/or use of iterative reconstruction technique. CONTRAST:  75mL OMNIPAQUE  IOHEXOL  350 MG/ML SOLN COMPARISON:  Brain MRI yesterday.  CTA head and neck 11/23/2023. FINDINGS: CTA NECK Skeleton: Carious dentition. Mild C2 spina bifida occulta (normal variant). No acute osseous abnormality identified. Upper chest: Negative; incidental mediastinal lipomatosis. Other neck: Nonvascular neck soft tissue spaces appear negative. Aortic arch: Mildly bovine type arch configuration. No arch atherosclerosis. Right carotid system: Patent with mild tortuosity, no significant plaque. No stenosis. Left carotid system: Patent with mild tortuosity. No convincing plaque. No stenosis. Vertebral arteries: Patent proximal right subclavian artery with no plaque or stenosis. Diminutive right vertebral artery, origin grossly normal. Right vertebral appears diminutive on a congenital basis (series 6, image 215), remains small but patent to the skull base. Proximal left subclavian artery and left vertebral artery origin are normal. Normal caliber left vertebral artery appears patent and normal to the skull base. CTA HEAD Posterior circulation: Diminutive right vertebral artery terminates in PICA. Left vertebral supplies the basilar with patent left PICA origin. No distal vertebral stenosis. Patent basilar artery and AICA origins but beginning at that level there is mild to moderate basilar irregularity and stenosis best seen on series 13,  image 31 (also series 6, image 136). Distal basilar remains patent to the SCA origins. Fetal type left PCA origin redemonstrated. Fetal type left PCA with mild irregularity of the posterior communicating artery. Mild to moderate left P2 and P3 segment irregularity and stenosis as seen on series 13, image 34, stable since May. Contralateral right PCA functionally occluded with very faint reconstituted enhancement (series 13, image 27) with superimposed tandem stenoses. The appearance is stable since May. Anterior circulation: Both ICA siphons are patent. Left siphon mild tortuosity, minimal plaque and no stenosis. Normal left posterior communicating artery origin. Right siphon supraclinoid calcified plaque with mild stenosis on series 9, image 178 is stable. Patent carotid termini. Patent MCA and ACA origins with dominant left A1 again noted. Mild A1 segment irregularity without stenosis. Normal anterior communicating artery. Bilateral ACA branches are patent with mild irregularity, appears stable. MCA M1 segments and MCA bifurcations are patent without stenosis. Bilateral MCA branches appear stable with occasional mild M2 irregularity and stenosis such as anterior left division series 13, image 38. Venous sinuses: Patent.  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 Fetal type Left PCA origin. Mildly bovine aortic arch configuration. Review of the MIP images confirms the above findings IMPRESSION: 1. Negative for emergent large vessel occlusion. And no significant extracranial atherosclerosis or stenosis. 2. Positive for advanced intracranial posterior circulation atherosclerosis: - functionally occluded right PCA with minimal reconstitution, stable from May. - mild to moderate tandem Basilar artery and fetal Left PCA irregularity and stenoses. 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 3. Unremarkable Aortic arch (mildly bovine  configuration). Electronically Signed   By: VEAR Hurst M.D.   On: 03/21/2024 04:55   MR Brain W and Wo Contrast Result Date: 03/20/2024 CLINICAL DATA:  Neuro deficit, acute, stroke suspected; Ataxia, nontraumatic, cervical pathology suspected EXAM: MRI HEAD WITHOUT AND WITH CONTRAST MRI CERVICAL SPINE WITHOUT AND WITH CONTRAST CONTRAST:  10mL GADAVIST  GADOBUTROL  1 MMOL/ML IV SOLN TECHNIQUE: Multiplanar, multiecho pulse sequences of the brain and surrounding structures, and cervical spine were obtained without and with intravenous contrast. COMPARISON:  CT head from earlier today. MRI cervical spine 02/23/2015. FINDINGS: MRI HEAD FINDINGS Brain: Multiple small acute or early subacute infarcts in the posterior limb of the right internal capsule, right thalamus, and right midbrain. No significant edema or mass effect. Age advanced T2/FLAIR hyperintensities in the white matter, likely axillary chronic microvascular ischemic disease with multiple remote lacunar infarcts in the corona radiata bilaterally. Remote right PCA territory infarct. No evidence of acute hemorrhage, mass lesion, midline shift or hydrocephalus. No abnormal enhancement. Vascular: Chronically hypoplastic right intradural vertebral artery. Otherwise, major arterial flow voids are maintained at the skull base. Skull and upper cervical spine: Normal marrow signal. Sinuses/Orbits: Clear sinuses.  No acute orbital findings. Other: No mastoid effusions. MRI CERVICAL SPINE FINDINGS Alignment: Normal. Vertebrae: No marrow edema to suggest acute fracture or discitis/osteomyelitis. No suspicious bone lesions. Cord: Normal cord signal on motion limited assessment. No abnormal cord enhancement. Posterior Fossa, vertebral arteries, paraspinal tissues: The visualized vertebral artery flow voids are maintained in the neck. No visible paraspinal edema. Disc levels: No significant disc protrusion, foraminal stenosis, or canal stenosis. Mild facet arthropathy at  multiple levels. IMPRESSION: MRI head: 1. Multiple acute or early subacute infarcts in the right posterior limb internal capsule, thalamus and right midbrain. 2. Remote right PCA territory infarct, multiple remote lacunar infarcts in the corona radiata bilaterally, and age advanced chronic microvascular ischemic disease. MRI cervical spine: No evidence of acute abnormality or significant stenosis. Electronically Signed   By: Gilmore GORMAN Molt M.D.   On: 03/20/2024 13:36   MR Cervical Spine W and Wo Contrast Result Date: 03/20/2024 CLINICAL DATA:  Neuro deficit, acute, stroke suspected; Ataxia, nontraumatic, cervical pathology suspected EXAM: MRI HEAD WITHOUT AND WITH CONTRAST MRI CERVICAL SPINE WITHOUT AND WITH CONTRAST CONTRAST:  10mL GADAVIST  GADOBUTROL  1 MMOL/ML IV SOLN TECHNIQUE: Multiplanar, multiecho pulse sequences of the brain and surrounding structures, and cervical spine were obtained without and with intravenous contrast. COMPARISON:  CT head from earlier today. MRI cervical spine 02/23/2015. FINDINGS: MRI HEAD FINDINGS Brain: Multiple small acute or early subacute infarcts in the posterior limb of the right internal capsule, right thalamus, and right midbrain. No significant edema or mass effect. Age advanced T2/FLAIR hyperintensities in the white matter, likely axillary chronic microvascular ischemic disease with multiple remote lacunar infarcts in the corona radiata bilaterally. Remote right PCA territory infarct. No evidence of acute hemorrhage, mass lesion, midline shift or hydrocephalus. No abnormal enhancement. Vascular: Chronically hypoplastic  right intradural vertebral artery. Otherwise, major arterial flow voids are maintained at the skull base. Skull and upper cervical spine: Normal marrow signal. Sinuses/Orbits: Clear sinuses.  No acute orbital findings. Other: No mastoid effusions. MRI CERVICAL SPINE FINDINGS Alignment: Normal. Vertebrae: No marrow edema to suggest acute fracture or  discitis/osteomyelitis. No suspicious bone lesions. Cord: Normal cord signal on motion limited assessment. No abnormal cord enhancement. Posterior Fossa, vertebral arteries, paraspinal tissues: The visualized vertebral artery flow voids are maintained in the neck. No visible paraspinal edema. Disc levels: No significant disc protrusion, foraminal stenosis, or canal stenosis. Mild facet arthropathy at multiple levels. IMPRESSION: MRI head: 1. Multiple acute or early subacute infarcts in the right posterior limb internal capsule, thalamus and right midbrain. 2. Remote right PCA territory infarct, multiple remote lacunar infarcts in the corona radiata bilaterally, and age advanced chronic microvascular ischemic disease. MRI cervical spine: No evidence of acute abnormality or significant stenosis. Electronically Signed   By: Gilmore GORMAN Molt M.D.   On: 03/20/2024 13:36   CT HEAD WO CONTRAST Result Date: 03/20/2024 EXAM: CT HEAD WITHOUT CONTRAST 03/20/2024 10:41:07 AM TECHNIQUE: CT of the head was performed without the administration of intravenous contrast. Automated exposure control, iterative reconstruction, and/or weight based adjustment of the mA/kV was utilized to reduce the radiation dose to as low as reasonably achievable. COMPARISON: Comparison with CT and MRI 11/22/2021. CLINICAL HISTORY: Neuro deficit, acute, stroke suspected. FINDINGS: BRAIN AND VENTRICLES: New hypodense foci in the periventricular white matter, greater on the left (for example series 3, image 21). Similar abnormal hypodensity in the right occipital and left temporal lobes. Similar hypodensity in the left thalamus. No acute hemorrhage. No evidence of acute infarct. No hydrocephalus. No extra-axial collection. No mass effect or midline shift. ORBITS: No acute abnormality. SINUSES: No acute abnormality. SOFT TISSUES AND SKULL: No acute soft tissue abnormality. No skull fracture. IMPRESSION: 1. New hypodense foci in the periventricular white  matter, greater on the left. Differential considerations are new age-indeterminate infarcts versus progression of demyelinating disease. Consider MRI without and with IV contrast. 2. These results were called by telephone at the time of interpretation on 03/20/24 at 10:55 to provider Sherran Barks, PA, who verbally acknowledged these results. Electronically signed by: Norman Gatlin MD 03/20/2024 11:03 AM EDT RP Workstation: [id number]    Scheduled Meds:   stroke: early stages of recovery book   Does not apply Once   aspirin  EC  81 mg Oral Q breakfast   atorvastatin   40 mg Oral Daily   cyanocobalamin   1,000 mcg Subcutaneous Daily   Followed by   NOREEN ON 03/27/2024] cyanocobalamin   1,000 mcg Subcutaneous Weekly   feeding supplement  237 mL Oral BID BM   folic acid   2 mg Oral Daily   Continuous Infusions:  sodium chloride  40 mL/hr at 03/20/24 1917     LOS: 1 day   Fredia Skeeter, MD [facility]  03/21/2024, 7:56 AM   *Please note that this is a verbal dictation therefore any spelling or grammatical errors are due to the Dragon Medical One system interpretation.  Please page via Amion and do not message via secure chat for urgent patient care matters. Secure chat can be used for non urgent patient care matters.  How to contact the [facility] Attending or Consulting provider 7A - 7P or covering provider during after hours 7P -7A, for this patient?  Check the care team in [facility] and look for a) attending/consulting [facility] provider listed and b) the [facility] team listed.  Page or secure chat 7A-7P. Log into [email or web address] and use 's universal password to access. If you do not have the password, please contact the hospital operator. Locate the [facility] provider you are looking for under [facility] and page to a number that you can be directly reached. If you still have difficulty reaching the provider, please page the [facility] (Director on Call) for the Hospitalists listed on amion for  assistance.

## 2024-03-21 NOTE — Evaluation (Signed)
 Physical Therapy Evaluation Patient Details Name: Nicole Travis MRN: [id number] DOB: [birth year] Today's Date: 03/21/2024  History of Present Illness  Pt is 43 yo presenting to [facility] on 8/30 due to new findings of L sided weakness which improved but then re-occurred. Pt transferred to [facility]. Pt found to have anemia and acute CVA per MD note. 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 PMH: HTN, CVA 2015 and 2025, heavy menstrual periods.  Clinical Impression  Pt is presenting below baseline level of functioning. Currently requires CGA to supervision for sit to stand and gait with RW. Mild decrease in L wgt shifting without buckling noted at the L LE during functional activities. Due to pt current functional status, home set up and available assistance at home recommending skilled physical therapy services 3x/week in order to address strength, balance and functional mobility to decrease risk for falls, injury and re-hospitalization.           If plan is discharge home, recommend the following: A little help with walking and/or transfers;Help with stairs or ramp for entrance;Assist for transportation;Assistance with cooking/housework     Equipment Recommendations Rolling walker (2 wheels);BSC/3in1     Functional Status Assessment Patient has had a recent decline in their functional status and demonstrates the ability to make significant improvements in function in a reasonable and predictable amount of time.     Precautions / Restrictions Precautions Precautions: Fall Recall of Precautions/Restrictions: Intact Restrictions Weight Bearing Restrictions Per Provider Order: No      Mobility  Bed Mobility     General bed mobility comments:  see OT note    Transfers Overall transfer level: Needs assistance Equipment used: Rolling walker (2 wheels) Transfers: Sit to/from Stand, Bed to chair/wheelchair/BSC Sit to Stand: Contact guard assist, Supervision           General transfer comment: CGA to supervision for sit to stand from EOB with verbal cues for safe hand placement and intermittent CGA for safety with transfers from recliner to BSCS    Ambulation/Gait Ambulation/Gait assistance: Contact guard assist, Supervision Gait Distance (Feet): 200 Feet Assistive device: Rolling walker (2 wheels) Gait Pattern/deviations: Step-through pattern, Decreased stance time - left, Decreased step length - right, Decreased weight shift to left Gait velocity: mildly decreased Gait velocity interpretation: 1.31 - 2.62 ft/sec, indicative of limited community ambulator   General Gait Details: Pt is demonstrating decreased wgt shift and stance time on LLE without any buckling noted. Good navigation of RW, CGA to supervision for safety.   Modified Rankin (Stroke Patients Only) Modified Rankin (Stroke Patients Only) Pre-Morbid Rankin Score: No symptoms Modified Rankin: Moderate disability     Balance Overall balance assessment: Mild deficits observed, not formally tested       Pertinent Vitals/Pain Pain Assessment Pain Assessment: No/denies pain    Home Living Family/patient expects to be discharged to:: Private residence Living Arrangements: Spouse/significant other Available Help at Discharge: Available PRN/intermittently;Family (spouse and  dtr work) Type of Home: House Home Access: Ramped entrance       Home Layout: One level Home Equipment: Shower seat      Prior Function Prior Level of Function : Independent/Modified Independent;Driving       Extremity/Trunk Assessment   Upper Extremity Assessment Upper Extremity Assessment: Defer to OT evaluation  Lower Extremity Assessment Lower Extremity Assessment:  Overall WFL for tasks assessed    Cervical / Trunk Assessment Cervical / Trunk Assessment: Kyphotic  Communication   Communication Communication: Impaired Factors Affecting Communication: Reduced clarity of speech    Cognition Arousal: Alert Behavior During Therapy: WFL for tasks assessed/performed   PT - Cognitive impairments: No apparent impairments       Cueing Cueing Techniques: Verbal cues            Assessment/Plan    PT Assessment Patient needs continued PT services  PT Problem List Decreased strength;Decreased balance;Decreased mobility       PT Treatment Interventions DME instruction;Balance training;Neuromuscular re-education;Gait training;Stair training;Functional mobility training;Therapeutic activities;Therapeutic exercise;Patient/family education    PT Goals (Current goals can be found in the Care Plan section)  Acute Rehab PT Goals Patient Stated Goal: to return to PLOF PT Goal Formulation: With patient Time For Goal Achievement: 04/04/24 Potential to Achieve Goals: Good    Frequency Min 2X/week     Co-evaluation PT/OT/SLP Co-Evaluation/Treatment: Yes Reason for Co-Treatment: To address functional/ADL transfers PT goals addressed during session: Mobility/safety with mobility;Balance         AM-PAC PT 6 Clicks Mobility  Outcome Measure Help needed turning from your back to your side while in a flat bed without using bedrails?: A Little Help needed moving from lying on your back to sitting on the side of a flat bed without using bedrails?: A Little Help needed moving to and from a bed to a chair (including a wheelchair)?: A Little Help needed standing up from a chair using your arms (e.g., wheelchair or bedside chair)?: A Little Help needed to walk in hospital room?: A Little Help needed climbing 3-5 steps with a railing? : A Little 6 Click Score: 18    End of Session Equipment Utilized During Treatment: Gait belt Activity Tolerance:  Patient tolerated treatment well Patient left: in chair;with call bell/phone within reach;with chair alarm set Nurse Communication: Mobility status PT Visit Diagnosis: Unsteadiness on feet (R26.81);Other abnormalities of gait and mobility (R26.89)    Time: 8856-8796 PT Time Calculation (min) (ACUTE ONLY): 20 min   Charges:   PT Evaluation $PT Eval Low Complexity: 1 Low   PT General Charges $$ ACUTE PT VISIT: 1 Visit        Dorothyann Maier, DPT, CLT  Acute Rehabilitation Services Office: [phone] (Secure chat preferred)   Dorothyann VEAR Maier 03/21/2024, 12:13 PM

## 2024-03-21 NOTE — Plan of Care (Signed)
  Problem: Education: Goal: Knowledge of disease or condition will improve Outcome: Progressing   Problem: Ischemic Stroke/TIA Tissue Perfusion: Goal: Complications of ischemic stroke/TIA will be minimized Outcome: Progressing   Problem: Safety: Goal: Ability to remain free from injury will improve Outcome: Progressing   Problem: Pain Managment: Goal: General experience of comfort will improve and/or be controlled Outcome: Progressing

## 2024-03-21 NOTE — Progress Notes (Signed)
   03/21/24 2014  BiPAP/CPAP/SIPAP  $ Non-Invasive Home Ventilator  Subsequent  BiPAP/CPAP/SIPAP Pt Type Adult  BiPAP/CPAP/SIPAP Resmed  Mask Type Full face mask  Dentures removed? Not applicable  Mask Size Medium  Respiratory Rate 18 breaths/min  IPAP 15 cmH20  EPAP 5 cmH2O  FiO2 (%) 21 %  Patient Home Machine No  Patient Home Mask No  Patient Home Tubing No  Auto Titrate Yes  Minimum cmH2O 5 cmH2O  Maximum cmH2O 15 cmH2O  Device Plugged into [employer] Power Outlet Yes

## 2024-03-22 ENCOUNTER — Observation Stay (HOSPITAL_BASED_OUTPATIENT_CLINIC_OR_DEPARTMENT_OTHER): Payer: Self-pay

## 2024-03-22 DIAGNOSIS — E538 Deficiency of other specified B group vitamins: Secondary | ICD-10-CM | POA: Insufficient documentation

## 2024-03-22 DIAGNOSIS — I639 Cerebral infarction, unspecified: Secondary | ICD-10-CM

## 2024-03-22 DIAGNOSIS — D500 Iron deficiency anemia secondary to blood loss (chronic): Secondary | ICD-10-CM | POA: Insufficient documentation

## 2024-03-22 LAB — LIPID PANEL
Cholesterol: 116 mg/dL (ref 0–200)
HDL: 37 mg/dL — ABNORMAL LOW (ref >40)
LDL Cholesterol: 68 mg/dL (ref 0–99)
Total CHOL/HDL Ratio: 3.1 ratio
Triglycerides: 55 mg/dL (ref <150)
VLDL: 11 mg/dL (ref 0–40)

## 2024-03-22 LAB — BASIC METABOLIC PANEL WITH GFR
Anion gap: 11 (ref 5–15)
BUN: 7 mg/dL (ref 6–20)
CO2: 27 mmol/L (ref 22–32)
Calcium: 8.9 mg/dL (ref 8.9–10.3)
Chloride: 104 mmol/L (ref 98–111)
Creatinine, Ser: 0.8 mg/dL (ref 0.44–1.00)
GFR, Estimated: >60 mL/min (ref >60)
Glucose, Bld: 99 mg/dL (ref 70–99)
Potassium: 3.5 mmol/L (ref 3.5–5.1)
Sodium: 142 mmol/L (ref 135–145)

## 2024-03-22 LAB — CBC WITH DIFFERENTIAL/PLATELET
Abs Immature Granulocytes: 0.06 K/uL (ref 0.00–0.07)
Basophils Absolute: 0.1 K/uL (ref 0.0–0.1)
Basophils Relative: 1 %
Eosinophils Absolute: 0.2 K/uL (ref 0.0–0.5)
Eosinophils Relative: 2 %
HCT: 28.3 % — ABNORMAL LOW (ref 36.0–46.0)
Hemoglobin: 8.1 g/dL — ABNORMAL LOW (ref 12.0–15.0)
Immature Granulocytes: 1 %
Lymphocytes Relative: 36 %
Lymphs Abs: 2.3 K/uL (ref 0.7–4.0)
MCH: 21.2 pg — ABNORMAL LOW (ref 26.0–34.0)
MCHC: 28.6 g/dL — ABNORMAL LOW (ref 30.0–36.0)
MCV: 74.1 fL — ABNORMAL LOW (ref 80.0–100.0)
Monocytes Absolute: 0.4 K/uL (ref 0.1–1.0)
Monocytes Relative: 7 %
Neutro Abs: 3.4 K/uL (ref 1.7–7.7)
Neutrophils Relative %: 53 %
Platelets: 267 K/uL (ref 150–400)
RBC: 3.82 MIL/uL — ABNORMAL LOW (ref 3.87–5.11)
RDW: 27.1 % — ABNORMAL HIGH (ref 11.5–15.5)
WBC: 6.5 K/uL (ref 4.0–10.5)
nRBC: 0.3 % — ABNORMAL HIGH (ref 0.0–0.2)

## 2024-03-22 LAB — HEMOGLOBIN A1C
Hgb A1c MFr Bld: 5 % (ref 4.8–5.6)
Mean Plasma Glucose: 96.8 mg/dL

## 2024-03-22 MED ORDER — VITAMIN B-12 1000 MCG PO TABS: 1000.0000 ug | ORAL_TABLET | Freq: Every day | ORAL | 0 refills | Status: AC

## 2024-03-22 MED ORDER — AMLODIPINE BESYLATE 10 MG PO TABS
10.0000 mg | ORAL_TABLET | Freq: Every day | ORAL | 0 refills | Status: AC
Start: 2024-03-22 — End: 2025-03-22

## 2024-03-22 MED ORDER — FOLIC ACID 1 MG PO TABS: 1.0000 mg | ORAL_TABLET | Freq: Every day | ORAL | 0 refills | Status: AC

## 2024-03-22 MED ORDER — ATORVASTATIN CALCIUM 20 MG PO TABS: 20.0000 mg | ORAL_TABLET | Freq: Every day | ORAL | 0 refills | Status: DC

## 2024-03-22 NOTE — Progress Notes (Addendum)
 Transition of Care [facility]) - Inpatient Brief Assessment   Patient Details  Name: Nicole Travis MRN: [id number] Date of Birth: 25-Sep-1980  [facility] CM/SW Contact:    Rosaline JONELLE Joe, RN Phone Number: 03/22/2024, 11:11 AM   Clinical Narrative: Patient admitted for ICH.  Patient is discharging today.  I spoke with the patient's daughter by phone and patient does not have Medicaid listed.  Patient plans to get DME through her family per daughter.  No DME needed for home.  OPPT order placed for therapy at [facility] location - to be co-signed by MD.  Patient has no PCP - PCP follow up placed in the AVS since PCP offices are closed today.  Family plans to provide transportation to home via car.  Transition of Care Asessment: Insurance and Status: (P) Insurance coverage has been reviewed Patient has primary care physician: (P) No (Will place [facility] in discharge instructions for follow up) Home environment has been reviewed: (P) from home Prior level of function:: (P) self Prior/Current Home Services: (P) No current home services Social Drivers of Health Review: (P) SDOH reviewed interventions complete Readmission risk has been reviewed: (P) Yes Transition of care needs: (P) transition of care needs identified, TOC will continue to follow

## 2024-03-22 NOTE — Progress Notes (Signed)
 Initiated Enteric Precautions because had been having watery stools but found out by pt that this resolved several days ago and she has not had any further watery stools. Is discharging to go home today and was explained about enteric precautions infection prevention.

## 2024-03-22 NOTE — Progress Notes (Signed)
 OT Cancellation Note  Patient Details Name: Nicole Travis MRN: [id number] DOB: 08-11-1980   Cancelled Treatment:     Pt sleeping soundly with CPAP on--will check back as schedule allows.  Donny BECKER OT Acute Rehabilitation Services Office [phone]    [place] [name] 03/22/2024, 8:27 AM

## 2024-03-22 NOTE — Progress Notes (Signed)
 Physical Therapy Treatment Patient Details Name: Nicole Travis MRN: [id number] DOB: 1981-07-16 Today's Date: 03/22/2024   History of Present Illness Pt is 43 yo presenting to [facility] on 8/30 due to new findings of L sided weakness which improved but then re-occurred. Pt transferred to [facility]. Pt found to have anemia and acute CVA per MD note. 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 PMH: HTN, CVA 2015 and 2025, heavy menstrual periods.    PT Comments  Pt progressing well towards her physical therapy goals and is highly motivated. Pt ambulating household distances with a RW without physical assist. Trialed gait training with hall rail vs SPC, however, pt with increased dynamic instability and lateral drift. Continued to recommend RW for increased independence and safety and reviewed home set up and fall prevention techniques. Pt negotiated 2 steps with railings and CGA. Discussed having supervision/assist present when attempting to navigate step down to one of her bathrooms. Pt demonstrates left sided weakness, incoordination, impaired standing balance and gait abnormalities. Recommend OPPT to address.    If plan is discharge home, recommend the following: A little help with walking and/or transfers;Help with stairs or ramp for entrance;Assist for transportation;Assistance with cooking/housework   Can travel by private Automotive engineer (2 wheels);BSC/3in1    Recommendations for Other Services       Precautions / Restrictions Precautions Precautions: Fall Recall of Precautions/Restrictions: Intact Restrictions Weight Bearing Restrictions Per Provider Order: No     Mobility  Bed Mobility                General bed mobility comments: OOB in chair upon entry    Transfers Overall transfer level: Needs assistance Equipment used: Rolling walker (2 wheels) Transfers: Sit to/from Stand Sit to Stand: Supervision                Ambulation/Gait Ambulation/Gait assistance: Contact guard assist, Supervision Gait Distance (Feet): 150 Feet Assistive device: Rolling walker (2 wheels), Straight cane (hall rail) Gait Pattern/deviations: Step-through pattern, Decreased stance time - left, Decreased step length - right, Decreased weight shift to left Gait velocity: decreased     General Gait Details: Min verbal cueing for walker proximity and upward gaze. Pt with steady pace and able to perform head turns and stepping over obstacles without loss of balance. With SPC, pt with increased dynamic instability and drift towards L   Stairs Stairs: Yes Stairs assistance: Contact guard assist Stair Management: Two rails Number of Stairs: 2 General stair comments: Verbal cues for sequencing/technique   Wheelchair Mobility     Tilt Bed    Modified Rankin (Stroke Patients Only) Modified Rankin (Stroke Patients Only) Pre-Morbid Rankin Score: No symptoms Modified Rankin: Moderate disability     Balance Overall balance assessment: Mild deficits observed, not formally tested                                          Communication Communication Communication: Impaired Factors Affecting Communication: Reduced clarity of speech  Cognition Arousal: Alert Behavior During Therapy: WFL for tasks assessed/performed   PT - Cognitive impairments: No apparent impairments  Following commands: Intact      Cueing Cueing Techniques: Verbal cues  Exercises      General Comments        Pertinent Vitals/Pain Pain Assessment Pain Assessment: No/denies pain    Home Living                          Prior Function             PT Goals (current goals can now be found in the care plan section) Acute Rehab PT Goals Patient Stated Goal: to return to PLOF Potential to Achieve Goals: Good Progress towards PT goals: Progressing toward goals    Frequency    Min 2X/week      PT Plan      Co-evaluation              AM-PAC PT 6 Clicks Mobility   Outcome Measure  Help needed turning from your back to your side while in a flat bed without using bedrails?: None Help needed moving from lying on your back to sitting on the side of a flat bed without using bedrails?: A Little Help needed moving to and from a bed to a chair (including a wheelchair)?: A Little Help needed standing up from a chair using your arms (e.g., wheelchair or bedside chair)?: A Little Help needed to walk in hospital room?: A Little Help needed climbing 3-5 steps with a railing? : A Little 6 Click Score: 19    End of Session Equipment Utilized During Treatment: Gait belt Activity Tolerance: Patient tolerated treatment well Patient left: in chair;with call bell/phone within reach;Other (comment) (with SLP [place]lam) Nurse Communication: Mobility status PT Visit Diagnosis: Unsteadiness on feet (R26.81);Other abnormalities of gait and mobility (R26.89)     Time: 1041-1100 PT Time Calculation (min) (ACUTE ONLY): 19 min  Charges:    $Gait Training: 8-22 mins PT General Charges $$ ACUTE PT VISIT: 1 Visit                     Aleck Daring, PT, DPT Acute Rehabilitation Services Office [phone]    [name] 03/22/2024, 11:28 AM

## 2024-03-22 NOTE — Discharge Summary (Signed)
 Physician Discharge Summary  Nicole Travis FMW:[id number] DOB: 1981/06/07 DOA: 03/20/2024  PCP: Patient, No Pcp Per  Admit date: 03/20/2024 Discharge date: 03/22/2024 30 Day Unplanned Readmission Risk Score    Flowsheet Row ED to Hosp-Admission (Current) from 03/20/2024 in [facility]  30 Day Unplanned Readmission Risk Score (%) 11.09 Filed at 03/21/2024 1600    This score is the patient's risk of an unplanned readmission within 30 days of being discharged (0 -100%). The score is based on dignosis, age, lab data, medications, orders, and past utilization.   Low:  0-14.9   Medium: 15-21.9   High: 22-29.9   Extreme: 30 and above          Admitted From: Home Disposition: Home  Recommendations for Outpatient Follow-up:  Follow up with PCP in 1-2 weeks Please obtain BMP/CBC in one week Follow-up with your neurologist in 4 weeks Please follow up with your PCP on the following pending results: Unresulted Labs (From admission, onward)     Start     Ordered   03/21/24 1142  Vitamin B1  Add-on,   AD       Question:  Specimen collection method  Answer:  Lab=Lab collect   03/21/24 1141   03/21/24 0756  C Difficile Quick Screen w PCR reflex  (C Difficile quick screen w PCR reflex panel )  Once, for 24 hours,   TIMED       References:    CDiff Information Tool   03/21/24 0755              Home Health: Yes Equipment/Devices: None  Discharge Condition: Stable CODE STATUS: Full code Diet recommendation:  Diet Order             Diet Heart Room service appropriate? Yes; Fluid consistency: Thin  Diet effective now                   Subjective: Seen and examined, she has no complaints and she thinks that she is feeling much better.  Her speech is obviously much better and fluent and back to baseline.  Her strength in the left upper and lower extremity has improved and is very close to baseline as well.  She is eager to go home.  Brief/Interim Summary:  Nicole Travis  is a 43 y.o. female, with medical history significant of morbid obesity, HTN, prior history of stroke in 2015 with resolved left-sided hemiparesis, hospitalization May 2025 for acute CVA, discharged on dual antiplatelet therapy, statin and amlodipine , 30 days event monitor on discharge with no acute findings (per neurology note on 02/25/2024), patient with heavy menstrual period, have stopped her Plavix  recently, she presented to [facility] ED secondary to new findings of left-sided weakness, she woke up 6:00 a.m. 03/19/2024 with left-sided weakness, which initially improved during the day, but reoccurred at her job at 3:30 PM, by the time she came back home her symptoms has much improved, but it does appear her symptoms has worsened and recurrent which prompted her to come to ED .  Does endorse heavy menstrual period since starting aspirin  and Plavix . Workup in ED was significant for new anemia with hemoglobin of 4.8, MCV of 67.7, she was Hemoccult negative, MRI brain was significant for acute CVA, patient was transferred to [facility] for neuro consultation and admitted under hospital service.  Details below.   Acute CVA: Recent CTA head and neck in May 2025 significant for high-grade gnosis or occlusion of proximal right  posterior cerebral artery with some collateralization, and distal left P2 segment and basilar artery. She had MRI Brain w/o contrast which demonstrated multiple acute or early subacute infarcts in the right posterior limb internal capsule, thalamus and right midbrain. Hypercoagulable workup in the past has been negative.  TTE has been nonrevealing in the past.  She has not had a TEE which was recommended to her.  CTA head and neck repeated once again during this hospitalization which is negative for emergent LVO or significant extracranial atherosclerosis or stenosis but positive for advanced intracranial posterior circulation atherosclerosis, functionally occluded right PCA with minimal  reconstitution which is stable from May.  Mild to moderate tandem basilar artery and fetal left PCA irregularity and stenosis.  Patient has been started on aspirin  per neurology recommendation.  Seen by PT OT, they recommended home health.  Neurology thinks that her stroke is secondary to chronically occluded right PCA which was exacerbated by severe anemia and recommended treating anemia and continuing aspirin  and her neurologist will consider transitioning her to Pletal at some point in time if her anemia remains stable.  Patient is being discharged home in stable condition since cleared by neurology.  Hemoglobin A1c was 5.0.   Hyperlipidemia: Lipid panel done in May 2025 shows LDL of 104.  Repeat LDL shows 68.  Patient is supposed to be on atorvastatin  40 mg but she apparently was not taking.  We were giving her 40 mg here but now that LDL is 68, I am prescribing her 20 mg atorvastatin  at discharge.   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  Posttransfusion hemoglobin 8.3 now 8.1.  She has been started on B12 and folate replenishment as well.  FOBT negative.  She has history of menorrhagia.  Prescribing both B12 and folate supplements.   Morbid obesity class II: BMI 51.  Weight loss and diet modification counseled.   Diarrhea: Resolved.  Discharge plan was discussed with patient and/or family member and they verbalized understanding and agreed with it.  Discharge Diagnoses:  Principal Problem:   Acute CVA (cerebrovascular accident) [facility]) Active Problems:   Morbid obesity with BMI of 50.0-59.9, adult (HCC)   HTN (hypertension)   Iron deficiency anemia due to chronic blood loss   Folate deficiency    Discharge Instructions  Discharge Instructions     Ambulatory referral to Neurology   Complete by: As directed    Follow up with Dr. Ines at [facility] in 4 weeks. Pt  is Dr. Sharion pt. Thanks.      Allergies as of 03/22/2024       Reactions   Celexa  [citalopram  Hydrobromide] Anxiety        Medication List     STOP taking these medications    clopidogrel  75 MG tablet Commonly known as: PLAVIX        TAKE these medications    amLODipine  10 MG tablet Commonly known as: NORVASC  Take 1 tablet (10 mg total) by mouth daily. For BP   aspirin  EC 81 MG tablet Take 1 tablet (81 mg total) by mouth daily with breakfast. - For stroke prevention   atorvastatin  20 MG tablet Commonly known as: Lipitor Take 1 tablet (20 mg total) by mouth daily. What changed:  medication strength how much to take   cyanocobalamin  1000 MCG tablet Commonly known as: VITAMIN B12 Take 1 tablet (1,000 mcg total) by mouth daily.  folic acid  1 MG tablet Commonly known as: FOLVITE  Take 1 tablet (1 mg total) by mouth daily.        Follow-up Information     Ines Onetha NOVAK, MD. Schedule an appointment as soon as possible for a visit in 1 month(s).   Specialty: Neurology Contact information: [address] [place] [address] [phone]         PCP Follow up in 1 week(s).                 Allergies  Allergen Reactions   Celexa  [Citalopram  Hydrobromide] Anxiety    Consultations: Neurology   Procedures/Studies: CT ANGIO HEAD NECK W WO CM Result Date: 03/21/2024 CLINICAL DATA:  43 year old female with neurologic deficit. MRI yesterday reveals fairly widely scattered nonenhancing lesions with abnormal diffusion in both cerebral hemispheres, right deep gray matter, right midbrain. History of prior strokes. And intracranial atherosclerosis on CTA in May. EXAM: CT ANGIOGRAPHY HEAD AND NECK TECHNIQUE: Multidetector CT imaging of the head and neck was performed using the standard protocol during bolus administration of intravenous contrast. Multiplanar CT image reconstructions and MIPs were obtained to evaluate the vascular anatomy. Carotid  stenosis measurements (when applicable) are obtained utilizing NASCET criteria, using the distal internal carotid diameter as the denominator. RADIATION DOSE REDUCTION: This exam was performed according to the departmental dose-optimization program which includes automated exposure control, adjustment of the mA and/or kV according to patient size and/or use of iterative reconstruction technique. CONTRAST:  75mL OMNIPAQUE  IOHEXOL  350 MG/ML SOLN COMPARISON:  Brain MRI yesterday.  CTA head and neck 11/23/2023. FINDINGS: CTA NECK Skeleton: Carious dentition. Mild C2 spina bifida occulta (normal variant). No acute osseous abnormality identified. Upper chest: Negative; incidental mediastinal lipomatosis. Other neck: Nonvascular neck soft tissue spaces appear negative. Aortic arch: Mildly bovine type arch configuration. No arch atherosclerosis. Right carotid system: Patent with mild tortuosity, no significant plaque. No stenosis. Left carotid system: Patent with mild tortuosity. No convincing plaque. No stenosis. Vertebral arteries: Patent proximal right subclavian artery with no plaque or stenosis. Diminutive right vertebral artery, origin grossly normal. Right vertebral appears diminutive on a congenital basis (series 6, image 215), remains small but patent to the skull base. Proximal left subclavian artery and left vertebral artery origin are normal. Normal caliber left vertebral artery appears patent and normal to the skull base. CTA HEAD Posterior circulation: Diminutive right vertebral artery terminates in PICA. Left vertebral supplies the basilar with patent left PICA origin. No distal vertebral stenosis. Patent basilar artery and AICA origins but beginning at that level there is mild to moderate basilar irregularity and stenosis best seen on series 13, image 31 (also series 6, image 136). Distal basilar remains patent to the SCA origins. Fetal type left PCA origin redemonstrated. Fetal type left PCA with mild  irregularity of the posterior communicating artery. Mild to moderate left P2 and P3 segment irregularity and stenosis as seen on series 13, image 34, stable since May. Contralateral right PCA functionally occluded with very faint reconstituted enhancement (series 13, image 27) with superimposed tandem stenoses. The appearance is stable since May. Anterior circulation: Both ICA siphons are patent. Left siphon mild tortuosity, minimal plaque and no stenosis. Normal left posterior communicating artery origin. Right siphon supraclinoid calcified plaque with mild stenosis on series 9, image 178 is stable. Patent carotid termini. Patent MCA and ACA origins with dominant left A1 again noted. Mild A1 segment irregularity without stenosis. Normal anterior communicating artery. Bilateral ACA branches are patent with  mild irregularity, appears stable. MCA M1 segments and MCA bifurcations are patent without stenosis. Bilateral MCA branches appear stable with occasional mild M2 irregularity and stenosis such as anterior left division series 13, image 38. Venous sinuses: Patent. 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 Fetal type Left PCA origin. Mildly bovine aortic arch configuration. Review of the MIP images confirms the above findings IMPRESSION: 1. Negative for emergent large vessel occlusion. And no significant extracranial atherosclerosis or stenosis. 2. Positive for advanced intracranial posterior circulation atherosclerosis: - functionally occluded right PCA with minimal reconstitution, stable from May. - mild to moderate tandem Basilar artery and fetal Left PCA irregularity and stenoses. 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 3. Unremarkable Aortic arch (mildly bovine configuration). Electronically Signed   By: VEAR Hurst M.D.   On: 03/21/2024 04:55   MR Brain W and Wo Contrast Result Date: 03/20/2024 CLINICAL DATA:  Neuro  deficit, acute, stroke suspected; Ataxia, nontraumatic, cervical pathology suspected EXAM: MRI HEAD WITHOUT AND WITH CONTRAST MRI CERVICAL SPINE WITHOUT AND WITH CONTRAST CONTRAST:  10mL GADAVIST  GADOBUTROL  1 MMOL/ML IV SOLN TECHNIQUE: Multiplanar, multiecho pulse sequences of the brain and surrounding structures, and cervical spine were obtained without and with intravenous contrast. COMPARISON:  CT head from earlier today. MRI cervical spine 02/23/2015. FINDINGS: MRI HEAD FINDINGS Brain: Multiple small acute or early subacute infarcts in the posterior limb of the right internal capsule, right thalamus, and right midbrain. No significant edema or mass effect. Age advanced T2/FLAIR hyperintensities in the white matter, likely axillary chronic microvascular ischemic disease with multiple remote lacunar infarcts in the corona radiata bilaterally. Remote right PCA territory infarct. No evidence of acute hemorrhage, mass lesion, midline shift or hydrocephalus. No abnormal enhancement. Vascular: Chronically hypoplastic right intradural vertebral artery. Otherwise, major arterial flow voids are maintained at the skull base. Skull and upper cervical spine: Normal marrow signal. Sinuses/Orbits: Clear sinuses.  No acute orbital findings. Other: No mastoid effusions. MRI CERVICAL SPINE FINDINGS Alignment: Normal. Vertebrae: No marrow edema to suggest acute fracture or discitis/osteomyelitis. No suspicious bone lesions. Cord: Normal cord signal on motion limited assessment. No abnormal cord enhancement. Posterior Fossa, vertebral arteries, paraspinal tissues: The visualized vertebral artery flow voids are maintained in the neck. No visible paraspinal edema. Disc levels: No significant disc protrusion, foraminal stenosis, or canal stenosis. Mild facet arthropathy at multiple levels. IMPRESSION: MRI head: 1. Multiple acute or early subacute infarcts in the right posterior limb internal capsule, thalamus and right midbrain. 2.  Remote right PCA territory infarct, multiple remote lacunar infarcts in the corona radiata bilaterally, and age advanced chronic microvascular ischemic disease. MRI cervical spine: No evidence of acute abnormality or significant stenosis. Electronically Signed   By: Gilmore GORMAN Molt M.D.   On: 03/20/2024 13:36   MR Cervical Spine W and Wo Contrast Result Date: 03/20/2024 CLINICAL DATA:  Neuro deficit, acute, stroke suspected; Ataxia, nontraumatic, cervical pathology suspected EXAM: MRI HEAD WITHOUT AND WITH CONTRAST MRI CERVICAL SPINE WITHOUT AND WITH CONTRAST CONTRAST:  10mL GADAVIST  GADOBUTROL  1 MMOL/ML IV SOLN TECHNIQUE: Multiplanar, multiecho pulse sequences of the brain and surrounding structures, and cervical spine were obtained without and with intravenous contrast. COMPARISON:  CT head from earlier today. MRI cervical spine 02/23/2015. FINDINGS: MRI HEAD FINDINGS Brain: Multiple small acute or early subacute infarcts in the posterior limb of the right internal capsule, right thalamus, and right midbrain. No significant edema or mass effect. Age advanced T2/FLAIR hyperintensities in the white matter,  likely axillary chronic microvascular ischemic disease with multiple remote lacunar infarcts in the corona radiata bilaterally. Remote right PCA territory infarct. No evidence of acute hemorrhage, mass lesion, midline shift or hydrocephalus. No abnormal enhancement. Vascular: Chronically hypoplastic right intradural vertebral artery. Otherwise, major arterial flow voids are maintained at the skull base. Skull and upper cervical spine: Normal marrow signal. Sinuses/Orbits: Clear sinuses.  No acute orbital findings. Other: No mastoid effusions. MRI CERVICAL SPINE FINDINGS Alignment: Normal. Vertebrae: No marrow edema to suggest acute fracture or discitis/osteomyelitis. No suspicious bone lesions. Cord: Normal cord signal on motion limited assessment. No abnormal cord enhancement. Posterior Fossa, vertebral  arteries, paraspinal tissues: The visualized vertebral artery flow voids are maintained in the neck. No visible paraspinal edema. Disc levels: No significant disc protrusion, foraminal stenosis, or canal stenosis. Mild facet arthropathy at multiple levels. IMPRESSION: MRI head: 1. Multiple acute or early subacute infarcts in the right posterior limb internal capsule, thalamus and right midbrain. 2. Remote right PCA territory infarct, multiple remote lacunar infarcts in the corona radiata bilaterally, and age advanced chronic microvascular ischemic disease. MRI cervical spine: No evidence of acute abnormality or significant stenosis. Electronically Signed   By: Gilmore GORMAN Molt M.D.   On: 03/20/2024 13:36   CT HEAD WO CONTRAST Result Date: 03/20/2024 EXAM: CT HEAD WITHOUT CONTRAST 03/20/2024 10:41:07 AM TECHNIQUE: CT of the head was performed without the administration of intravenous contrast. Automated exposure control, iterative reconstruction, and/or weight based adjustment of the mA/kV was utilized to reduce the radiation dose to as low as reasonably achievable. COMPARISON: Comparison with CT and MRI 11/22/2021. CLINICAL HISTORY: Neuro deficit, acute, stroke suspected. FINDINGS: BRAIN AND VENTRICLES: New hypodense foci in the periventricular white matter, greater on the left (for example series 3, image 21). Similar abnormal hypodensity in the right occipital and left temporal lobes. Similar hypodensity in the left thalamus. No acute hemorrhage. No evidence of acute infarct. No hydrocephalus. No extra-axial collection. No mass effect or midline shift. ORBITS: No acute abnormality. SINUSES: No acute abnormality. SOFT TISSUES AND SKULL: No acute soft tissue abnormality. No skull fracture. IMPRESSION: 1. New hypodense foci in the periventricular white matter, greater on the left. Differential considerations are new age-indeterminate infarcts versus progression of demyelinating disease. Consider MRI without and  with IV contrast. 2. These results were called by telephone at the time of interpretation on 03/20/24 at 10:55 to provider Sherran Barks, PA, who verbally acknowledged these results. Electronically signed by: Norman Gatlin MD 03/20/2024 11:03 AM EDT RP Workstation: [id number]     Discharge Exam: Vitals:   03/22/24 0401 03/22/24 0834  BP: (!) 149/79 (!) 171/88  Pulse: (!) 50 64  Resp: 18 18  Temp: 97.9 F (36.6 C) 97.8 F (36.6 C)  SpO2: 100% 99%   Vitals:   03/21/24 2010 03/21/24 2309 03/22/24 0401 03/22/24 0834  BP: (!) 161/92 (!) 152/80 (!) 149/79 (!) 171/88  Pulse: 65 (!) 52 (!) 50 64  Resp: 18 18 18 18   Temp: 99.1 F (37.3 C) 99.5 F (37.5 C) 97.9 F (36.6 C) 97.8 F (36.6 C)  TempSrc: Oral Oral Oral Axillary  SpO2: 96% 100% 100% 99%  Weight:      Height:        General: Pt is alert, awake, not in acute distress Cardiovascular: RRR, S1/S2 +, no rubs, no gallops Respiratory: CTA bilaterally, no wheezing, no rhonchi Abdominal: Soft, NT, ND, bowel sounds + Extremities: no edema, no cyanosis Neuro: Fluent speech.  Mild weakness in left upper  extremity.  No other neurodeficit.   The results of significant diagnostics from this hospitalization (including imaging, microbiology, ancillary and laboratory) are listed below for reference.     Microbiology: No results found for this or any previous visit (from the past 240 hours).   Labs: BNP (last 3 results) No results for input(s): BNP in the last 8760 hours. Basic Metabolic Panel: Recent Labs  Lab 03/20/24 1121 03/22/24 0505  NA 140 142  K 3.6 3.5  CL 103 104  CO2 26 27  GLUCOSE 109* 99  BUN 7 7  CREATININE 0.61 0.80  CALCIUM  8.6* 8.9   Liver Function Tests: Recent Labs  Lab 03/20/24 1121  AST 17  ALT 13  ALKPHOS 82  BILITOT 1.0  PROT 6.6  ALBUMIN 3.1*   No results for input(s): LIPASE, AMYLASE in the last 168 hours. No results for input(s): AMMONIA in the last 168 hours. CBC: Recent  Labs  Lab 03/20/24 1121 03/21/24 0708 03/22/24 0505  WBC 6.2  --  6.5  NEUTROABS 4.1  --  3.4  HGB 4.8* 8.3* 8.1*  HCT 19.5* 28.2* 28.3*  MCV 67.7*  --  74.1*  PLT 316  --  267   Cardiac Enzymes: No results for input(s): CKTOTAL, CKMB, CKMBINDEX, TROPONINI in the last 168 hours. BNP: Invalid input(s): POCBNP CBG: Recent Labs  Lab 03/20/24 1006  GLUCAP 106*   D-Dimer No results for input(s): DDIMER in the last 72 hours. Hgb A1c Recent Labs    03/22/24 0505  HGBA1C 5.0   Lipid Profile Recent Labs    03/22/24 0505  CHOL 116  HDL 37*  LDLCALC 68  TRIG 55  CHOLHDL 3.1   Thyroid  function studies No results for input(s): TSH, T4TOTAL, T3FREE, THYROIDAB in the last 72 hours.  Invalid input(s): FREET3 Anemia work up Recent Labs    03/20/24 1140  VITAMINB12 <150*  FOLATE 3.2*  FERRITIN <3*  TIBC 493*  IRON 10*   Urinalysis No results found for: COLORURINE, APPEARANCEUR, LABSPEC, PHURINE, GLUCOSEU, HGBUR, BILIRUBINUR, KETONESUR, PROTEINUR, UROBILINOGEN, NITRITE, LEUKOCYTESUR Sepsis Labs Recent Labs  Lab 03/20/24 1121 03/22/24 0505  WBC 6.2 6.5   Microbiology No results found for this or any previous visit (from the past 240 hours).  FURTHER DISCHARGE INSTRUCTIONS:   Get Medicines reviewed and adjusted: Please take all your medications with you for your next visit with your Primary MD   Laboratory/radiological data: Please request your Primary MD to go over all hospital tests and procedure/radiological results at the follow up, please ask your Primary MD to get all Hospital records sent to his/her office.   In some cases, they will be blood work, cultures and biopsy results pending at the time of your discharge. Please request that your primary care M.D. goes through all the records of your hospital data and follows up on these results.   Also Note the following: If you experience worsening of your  admission symptoms, develop shortness of breath, life threatening emergency, suicidal or homicidal thoughts you must seek medical attention immediately by calling 911 or calling your MD immediately  if symptoms less severe.   You must read complete instructions/literature along with all the possible adverse reactions/side effects for all the Medicines you take and that have been prescribed to you. Take any new Medicines after you have completely understood and accpet all the possible adverse reactions/side effects.    patient was instructed, not to drive, operate heavy machinery, perform activities at heights, swimming or participation  in water activities or provide baby-sitting services while on Pain, Sleep and Anxiety Medications; until their outpatient Physician has advised to do so again. Also recommended to not to take more than prescribed Pain, Sleep and Anxiety Medications.  It is not advisable to combine anxiety, sleep and pain medications without talking with your primary care provider.     Wear Seat belts while driving.   Please note: You were cared for by a hospitalist during your hospital stay. Once you are discharged, your primary care physician will handle any further medical issues. Please note that NO REFILLS for any discharge medications will be authorized once you are discharged, as it is imperative that you return to your primary care physician (or establish a relationship with a primary care physician if you do not have one) for your post hospital discharge needs so that they can reassess your need for medications and monitor your lab values  Time coordinating discharge: Over 30 minutes  SIGNED:   Fredia Skeeter, MD  [facility] 03/22/2024, 10:34 AM *Please note that this is a verbal dictation therefore any spelling or grammatical errors are due to the Dragon Medical One system interpretation. If 7PM-7AM, please contact night-coverage [email or web address]

## 2024-03-22 NOTE — Progress Notes (Signed)
 Discharged to home after IV access was removed and discharge instructions were reviewed with and all questions answered by [facility] nurse Reena Harari.  Pt said she felt that her discharge instructions were very thorough and all questions answered.

## 2024-03-22 NOTE — Progress Notes (Signed)
 Pt is stable hemodynamically, afebrile, no acute distress. She is able to rest well overnight with no major complaints. Neurological assessment per documented below. We will continue to monitor.   03/21/24 2359  Neurological  Neuro (WDL) X  Orientation Level Oriented X4  Cognition Appropriate at baseline;Appropriate attention/concentration;Appropriate judgement;Appropriate safety awareness;Appropriate for developmental age;Follows commands;No memory impairment  Speech Clear;Appropriate for developmental age;Appropriate at baseline  R Pupil Size (mm) 3  R Pupil Shape Round  R Pupil Reaction Brisk  L Pupil Size (mm) 3  L Pupil Shape Round  L Pupil Reaction Brisk  Motor Function/Sensation Assessment Head;Grip;Elbow extension;Elbow flexion;Pronator drift;Dorsiflexion;Plantar flexion;Arm ataxia;Leg ataxia;Motor response;Sensation;Motor strength  Facial Symmetry Symmetrical  R Hand Grip Strong  L Hand Grip Moderate  R Elbow Extension (Push/Biceps) Strong  L Elbow Extension (Push/Biceps) Moderate  R Elbow Flexion (Pull/Triceps) Strong  L Elbow Flexion (Pull/Triceps) Moderate  Right Pronator Drift Absent  Left Pronator Drift Present  R Foot Dorsiflexion Strong  L Foot Dorsiflexion Strong  R Foot Plantar Flexion Strong  L Foot Plantar Flexion Strong  R Finger to Nose (Point to [employer]) Smooth  L Finger to Nose (Point to [employer]) Smooth  R Heel to [employer] (Point to [employer]) Smooth  L Heel to [employer] to [employer]) Smooth  RUE Motor Response Purposeful movement  RUE Sensation Full sensation  RUE Motor Strength 5  LUE Motor Response Purposeful movement  LUE Sensation Decreased  LUE Motor Strength 4  RLE Motor Response Purposeful movement  RLE Sensation Full sensation  RLE Motor Strength 5  LLE Motor Response Purposeful movement  LLE Sensation Decreased  LLE Motor Strength 4  Neuro Symptoms Fatigue  Neuro symptoms relieved by Rest;Relaxation techniques (Comment)  Neuro Additional Assessments  Glasgow Coma Scale;NIH stroke scale  Glasgow Coma Scale  Eye Opening 4  Best Verbal Response (NON-intubated) 5  Best Motor Response 6  Glasgow Coma Scale Score 15  NIH Stroke Scale   Dizziness Present No  Headache Present No  Interval Shift assessment  Level of Consciousness (1a.)    0  LOC Questions (1b. )    0  LOC Commands (1c. )    0  Best Gaze (2. )   0  Visual (3. )   0  Facial Palsy (4. )     0  Motor Arm, Left (5a. )    1  Motor Arm, Right (5b. )  0  Motor Leg, Left (6a. )   1  Motor Leg, Right (6b. )  0  Limb Ataxia (7. ) 0  Sensory (8. )   1  Best Language (9. )   0  Dysarthria (10. ) 0  Extinction/Inattention (11.)    0  Complete NIHSS TOTAL 3  Neurological  Level of Consciousness Alert    Wendi Dash, RN

## 2024-03-22 NOTE — Progress Notes (Signed)
 Occupational Therapy Treatment and Discharge Patient Details Name: Nicole Travis MRN: [id number] DOB: 1980/08/15 Today's Date: 03/22/2024   History of present illness Pt is 43 yo presenting to [facility] on 8/30 due to new findings of L sided weakness which improved but then re-occurred. Pt transferred to [facility]. Pt found to have anemia and acute CVA per MD note. 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 PMH: HTN, CVA 2015 and 2025, heavy menstrual periods.   OT comments  This 43 yo female seen today (tried to see her earlier this AM since she states her symptoms have been more pronounced in the morning--however she was asleep on CPAP) to see how she was able to do tasks asked of her today compared to yesterday. She did need A with socks (does not normally wear them) and her current flip flops are not safe for her to ambulate in (pt and dtr in room made aware and verbalized understanding). She is overall at a S-CGA level with RW for ADLs with getting closer to S level (yesterday more of CGA). Family will A her prn and she has/will have necessary DME for increased safety at home. No further OT needs, we will sign off.      If plan is discharge home, recommend the following:  A little help with walking and/or transfers;A little help with bathing/dressing/bathroom;Assist for transportation;Assistance with cooking/housework   Equipment Recommendations  BSC/3in1       Precautions / Restrictions Precautions Precautions: Fall Recall of Precautions/Restrictions: Intact Restrictions Weight Bearing Restrictions Per Provider Order: No       Mobility Bed Mobility Overal bed mobility: Modified Independent             General bed mobility comments:  increased time    Transfers Overall transfer level: Needs assistance Equipment used: Rolling walker (2 wheels) Transfers: Sit to/from Stand Sit to Stand: Supervision                 Balance Overall balance assessment: Mild deficits observed, not formally tested                                         ADL either performed or assessed with clinical judgement   ADL Overall ADL's : Needs assistance/impaired                                       General ADL Comments: overall at a CGA-S level when up on her feet (with and without RW) due to intermittent left side weakness. Did determine that the flip flops that she normally uses are not good for her balance (pt and dtr aware and agree,espcially after pt tried wearing them while working with me). Pt reports she does sometimes forget to take her meds--dtr in room stated she would make sure her mom has taken them before dtr leaves for work.    Extremity/Trunk Assessment Upper Extremity Assessment Upper Extremity Assessment: Right hand dominant (LUE WFL)            Vision Baseline Vision/History: 1 Wears glasses (for driving) Ability to See in Adequate Light: 0 Adequate  Patient Visual Report: No change from baseline Vision Assessment?: Yes Eye Alignment: Within Functional Limits Ocular Range of Motion: Within Functional Limits Alignment/Gaze Preference: Within Defined Limits Tracking/Visual Pursuits: Able to track stimulus in all quads without difficulty Saccades: Within functional limits Convergence: Within functional limits Visual Fields: No apparent deficits         Communication Communication Communication: Impaired Factors Affecting Communication: Reduced clarity of speech   Cognition Arousal: Alert Behavior During Therapy: WFL for tasks assessed/performed Cognition: Cognition impaired             OT - Cognition Comments: She was talking about her vision being blurry far away  when she takes her glasses off. Upon further questioning she wears her glasses for distance vision so her vision would be blurry with her glasses off.                 Following commands: Intact        Cueing   Cueing Techniques: Verbal cues             Pertinent Vitals/ Pain       Pain Assessment Pain Assessment: No/denies pain         Frequency  Min 2X/week        Progress Toward Goals  OT Goals(current goals can now be found in the care plan section)  Progress towards OT goals: Progressing toward goals  Acute Rehab OT Goals Patient Stated Goal: to go home today OT Goal Formulation: With patient/family Time For Goal Achievement: 04/04/24 Potential to Achieve Goals: Good         AM-PAC OT 6 Clicks Daily Activity     Outcome Measure   Help from another person eating meals?: None Help from another person taking care of personal grooming?: A Little Help from another person toileting, which includes using toliet, bedpan, or urinal?: A Little Help from another person bathing (including washing, rinsing, drying)?: A Little Help from another person to put on and taking off regular upper body clothing?: A Little Help from another person to put on and taking off regular lower body clothing?: A Little 6 Click Score: 19    End of Session Equipment Utilized During Treatment: Gait belt;Rolling walker (2 wheels)  OT Visit Diagnosis: Other abnormalities of gait and mobility (R26.89);Muscle weakness (generalized) (M62.81)   Activity Tolerance Patient tolerated treatment well   Patient Left in chair;with call bell/phone within reach;with chair alarm set           Time: 8978-8955 OT Time Calculation (min): 23 min  Charges: OT General Charges $OT Visit: 1 Visit OT Treatments $Self Care/Home Management : 23-37 mins  [facility] OT Acute Rehabilitation Services Office [phone]    [facility] 03/22/2024, 12:03 PM

## 2024-03-22 NOTE — Evaluation (Signed)
 Speech Language Pathology Evaluation Patient Details Name: Nicole Travis MRN: [id number] DOB: 1980/11/03 Today's Date: 03/22/2024 Time: 8943-8891 SLP Time Calculation (min) (ACUTE ONLY): 12 min  Problem List:  Patient Active Problem List   Diagnosis Date Noted   Iron deficiency anemia due to chronic blood loss 03/22/2024   Folate deficiency 03/22/2024   Acute CVA (cerebrovascular accident) (HCC) 03/20/2024   Morbid obesity with BMI of 50.0-59.9, adult (HCC) 11/23/2023   Acute stroke due to ischemia (HCC) 11/23/2023   HTN (hypertension) 11/23/2023   Facial cellulitis 12/01/2015   Migraine 08/02/2015   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   Hemisensory deficit 11/05/2014   Hemiparesis (HCC) 11/05/2014   Complicated migraine, intractable 10/31/2014   Acute Stroke (cerebrum) (HCC) 10/31/2014   Convulsions/seizures (HCC) 10/31/2014   Past Medical History:  Past Medical History:  Diagnosis Date   Acute confusional migraine    DUB (dysfunctional uterine bleeding)    Headache    Hemisensory deficit    right   Hypertension    Stroke [facility]    Thalamic pain syndrome    Past Surgical History:  Past Surgical History:  Procedure Laterality Date   CESAREAN SECTION     TUBAL LIGATION     HPI:  Pt is 43 yo presenting to [facility] on 8/30 due to new findings of L sided weakness which improved but then re-occurred. Pt transferred to [facility]. Pt found to have anemia and acute CVA per MD note. 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 PMH: HTN, CVA 2015 and 2025, heavy menstrual periods.   Assessment / Plan / Recommendation Clinical Impression  Pt has no concerns with language or cognition but stated her speech has improved but is slurred and not  at baseline. There is mild left facial asymmetry at rest. She comprehends well, follows 3 step command, conversation and is able to express herself  adequately. Cognition was assessed to be within normal limits for memory, divergent naming, awareness and verbal problem solving including mental math. She demonstrates min-mild dysarthria marked by minimal imprecise articulation although she was intelligible in conversation. Educated pt and daughter that dysarthria could be concerning in louder environments or speaking on the phone. Introduced speech strategies with examples to pt and daughter took notes for pt to refer to and verbalized understanding. Pt states that husband sometimes observes pt with her medications (has pill box) and most bills are online and daughter can supervise initially. Do not recommend further ST for pt with pt and dtr in agreement.    SLP Assessment  SLP Recommendation/Assessment: Patient does not need any further Speech Language Pathology Services SLP Visit Diagnosis: Dysarthria and anarthria (R47.1)     Assistance Recommended at Discharge  PRN (see impression statement)  Functional Status Assessment Patient has not had a recent decline in their functional status  Frequency and Duration           SLP Evaluation Cognition  Overall Cognitive Status: Within Functional Limits for tasks assessed Arousal/Alertness: Awake/alert Orientation Level: Oriented X4 Attention: Sustained Sustained Attention: Appears intact Memory: Appears intact (recalled 3//4 words independenlty, 1/4 with semantic cue WNL on Cognistat) Awareness: Appears intact Problem Solving: Appears intact Safety/Judgment: Appears intact       Comprehension  Auditory Comprehension Overall Auditory Comprehension: Appears within functional limits for tasks assessed Commands:  Within Functional Limits (followed 3 step command) Visual Recognition/Discrimination Discrimination: Not tested Reading  Comprehension Reading Status: Not tested    Expression Expression Primary Mode of Expression: Verbal Verbal Expression Overall Verbal Expression: Appears within functional limits for tasks assessed Initiation: No impairment Level of Generative/Spontaneous Verbalization: Conversation Repetition:  (NT) Naming: No impairment Pragmatics: No impairment Written Expression Written Expression: Not tested   Oral / Motor  Oral Motor/Sensory Function Overall Oral Motor/Sensory Function: Mild impairment Facial ROM: Reduced left;Suspected CN VII (facial) dysfunction Facial Symmetry: Abnormal symmetry left;Suspected CN VII (facial) dysfunction Motor Speech Overall Motor Speech: Impaired Respiration: Within functional limits Phonation: Normal Resonance: Within functional limits Articulation: Impaired Level of Impairment: Conversation Intelligibility: Intelligible Motor Planning: Within functional limits Motor Speech Errors: Not applicable            Dustin Olam Bull 03/22/2024, 11:36 AM

## 2024-03-24 LAB — BPAM RBC
Blood Product Expiration Date: 202509222359
Blood Product Expiration Date: 202509222359
Blood Product Expiration Date: 202509232359
ISSUE DATE / TIME: 202508301437
ISSUE DATE / TIME: 202508301655
Unit Type and Rh: 5100
Unit Type and Rh: 5100
Unit Type and Rh: 5100

## 2024-03-24 LAB — TYPE AND SCREEN
ABO/RH(D): O POS
Antibody Screen: NEGATIVE
Unit division: 0
Unit division: 0
Unit division: 0

## 2024-03-26 LAB — VITAMIN B1: Vitamin B1 (Thiamine): 89.2 nmol/L (ref 66.5–200.0)

## 2024-04-19 ENCOUNTER — Inpatient Hospital Stay: Payer: Self-pay | Admitting: Neurology

## 2024-07-12 NOTE — Progress Notes (Unsigned)
 " [facility] [address] [place]. [place] [address] [phone]       HOSPITAL FOLLOW UP NOTE  Ms. Nicole Travis Date of Birth:  1981/06/21 Medical Record Number:  [id number]   Reason for visit: Stroke follow up    SUBJECTIVE:   CHIEF COMPLAINT:  No chief complaint on file.   HPI:   Update 07/13/2024 JM: Patient returns for 91-month follow-up visit.  At prior visit, recommended completion of 30-day cardiac monitor to evaluate for A-fib and referral placed for sleep evaluation for prior history of OSA.    Cardiac monitor negative for A-fib, referral placed to EP cardiology to complete TEE and ILR but never scheduled for evaluation. She no showed appointment with Dr. Chalice.  Diagnosed with recurrent right PCA stroke in 02/2024 after presenting with acute onset left-sided weakness and sensory deficit likely due to chronic right PCA occlusion in setting of severe anemia.  Required transfusions with improvement of hemoglobin, also noted iron deficiency, B12 deficiency and folate deficiency, supplementation recommended.  Recommended continuation of aspirin  and consider Pletal for long-term DAPT in the future given significant intracranial stenosis.     Consult visit 01/09/2024 Dr. Ines:  Nicole Travis is a 43 y.o. female here as requested by Pearlean Manus, MD for hospital follow up and stroke. has Complicated migraine, intractable; Acute Stroke (cerebrum) (HCC); Convulsions/seizures (HCC); Hemisensory deficit; Hemiparesis (HCC); Migraine; Thalamic pain syndrome; Facial cellulitis; Morbid obesity with BMI of 50.0-59.9, adult (HCC); Acute stroke due to ischemia [facility]); and HTN (hypertension) on their problem list.  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   She hadn't been on medicine, she was not on aspirin  she lost insurance, her father was in the ospital in 11/14/23 and died and there was stress, 2 months later her mother was in an accident 2 days ago and states that is why her BP is elevated, prior to th stroke she had ot taken any of her medications daughter is here and provides much information, since the stroke taking all medication, managing BP and on aspirin  and plavix  for 3 months and then aspirin  alone, didn;t get the 30 day monitoring. She has sleep apnea and uses her cpap every single night, pressure hasn;t been adjusted and the cpap is not hers because hers was damaged and uses someone elses and unsure if the settings are correct. She wsa seen at [facility] but closed down. Last sleep study was 7 years ago or longer. They were sitting at work, she was disoriented, the stroke started and lasted for days, states it was called an elongated stroke was not on her aspirin  before the stroke now they have a lot of bottles and won;t miss it again. She has anxiety, she does not have a primary care, she does not have insurance. [facility].   Reviewed notes, labs and imaging from outside physicians, which showed:   I reviewed Dr. Lorette notes from October 25, 2023: Chief complaint was confusional episodes, right eye vision deficits and memory deficits, she  presented to the hospital last known normal was Dec 19, 2023 before bedtime, event happened at home, no tPA as she was outside the window, no thrombectomy has no large vessel occlusion.  NIH stroke scale was 0.  CT of the head showed multiple foci of hypodensity in the right occipital lobe, left temporal  lobe and deep white matter of the posterior left frontal region.  Raising the concern of embolic etiology.  CTA of the head and neck with contrast showed high-grade stenosis of the proximal right PCA with some collateralization of distal vessels, moderate regular stenosis throughout the basilar artery, moderate stenosis in the distal left P2 segment.   CT Head without contrast 11/23/2023: Multiple foci of hypodensity in the right occipital lobe, left temporal lobe, and deep white matter of the posterior left frontal region. These changes all appear new in the interval. Acute to subacute ischemia could have this appearance in multifocal presentation would raise the question of embolic etiology. Foci of vasogenic edema from multiple occult brain lesions would also be a consideration. MRI of the brain with and without contrast recommended to further evaluate. (Reviewed CT of the head images and agree)     CT angio Head and Neck with contrast 11/23/2023:  High-grade stenosis or occlusion of the proximal right posterior cerebral artery with some collateralization of distal vessels. Moderate irregular stenosis throughout the basilar artery. Moderate stenosis in the distal left P2 segment. No other significant proximal stenosis, aneurysm, or branch vessel occlusion within the anterior circulation. No significant stenosis in the neck.(Reviewed report)   MRI Brain with and without contrast 11/23/2023 1. Confirmed acute abnormality - cytotoxic edema in the left hemisphere tracking from the posterior left thalamus through the left hippocampus and mesial temporal lobe. No associated hemorrhage or enhancement. No intracranial mass effect.   2. Nearby chronic appearing white matter signal changes including the Splenium, such that Demyelinating disease was considered. However, multiple other chronic areas most resembling small-vessel infarcts, including elsewhere in the left thalamus, anterior left corona radiata, and  right occipital cortex. And in conjunction with MRA report of intracranial atherosclerosis in 2016, strongly favor acute on chronic ischemic disease.   11/24/2023: CT Chest/abd/pelvis: IMPRESSION: 1. No metastatic disease identified within the chest, abdomen or pelvis.   Echo: IMPRESSIONS     1. No LV thrombus by Definity . Left ventricular ejection fraction, by  estimation, is 70 to 75%. The left ventricle has hyperdynamic function.  Left ventricular endocardial border not optimally defined to evaluate  regional wall motion. There is moderate  asymmetric left ventricular hypertrophy of the septal segment. Left  ventricular diastolic parameters are consistent with Grade I diastolic  dysfunction (impaired relaxation).   2. Right ventricular systolic function is normal. The right ventricular  size is normal. Tricuspid regurgitation signal is inadequate for assessing  PA pressure.   3. The mitral valve is normal in structure. No evidence of mitral valve  regurgitation. No evidence of mitral stenosis.   4. The aortic valve was not well visualized. Aortic valve regurgitation  is not visualized. No aortic stenosis is present.   5. The inferior vena cava is normal in size with greater than 50%  respiratory variability, suggesting right atrial pressure of 3 mmHg.    Hypercoagulability panel cardiolipin antibodies, prothrombin gene mutation, factor V Leiden, homocystine, beta-2  glycoprotein, lupus anticoagulant, protein S, protein C, all within normal limits.  Also drawn in early May 2025 during hospitalization includes HIV nonreactive, TSH normal, hemoglobin A1c 5.2, LDL 104,  urine rapid drug screen negative, ethanol negative, CMP with slightly elevated glucose and total protein otherwise normal, CBC unremarkable,   By review patient's event monitor was canceled, the reason of being unable to deliver required patient benefit quote.        ROS:   14 system review of systems performed and  negative with exception of ***  PMH:  Past Medical History:  Diagnosis Date   Acute confusional migraine    DUB (dysfunctional uterine bleeding)    Headache    Hemisensory deficit    right   Hypertension    Stroke (HCC)    Thalamic pain syndrome     PSH:  Past Surgical History:  Procedure Laterality Date   CESAREAN SECTION     TUBAL LIGATION      Social History:  Social History   Socioeconomic History   Marital status: Married    Spouse name: Not on file   Number of children: 2   Years of education: 12   Highest education level: Not on file  Occupational History   Occupation: [employer]  Tobacco Use   Smoking status: Never   Smokeless tobacco: Never  Vaping Use   Vaping status: Never Used  Substance and Sexual Activity   Alcohol use: No   Drug use: No   Sexual activity: Yes    Birth control/protection: Surgical  Other Topics Concern   Not on file  Social History Narrative   Lives at home with boyfriend and 4 kids.   Right handed.   Caffeine use:  Drinks 20 ounces per week Toni)    Social Drivers of Health   Tobacco Use: Low Risk (03/20/2024)   Patient History    Smoking Tobacco Use: Never    Smokeless Tobacco Use: Never    Passive Exposure: Not on file  Financial Resource Strain: Not on file  Food Insecurity: No Food Insecurity (03/20/2024)   Epic    Worried About Programme Researcher, Broadcasting/film/video in the Last Year: Never true    Ran Out of Food in the Last Year: Never true  Transportation Needs: No Transportation Needs (03/20/2024)   Epic    Lack of Transportation (Medical): No    Lack of Transportation (Non-Medical): No  Physical Activity: Not on file  Stress: Not on file  Social Connections: Not on file  Intimate Partner Violence: Not At Risk (03/20/2024)   Epic    Fear of Current or Ex-Partner: No    Emotionally Abused: No    Physically Abused: No    Sexually Abused: No  Depression (PHQ2-9): Not on file  Alcohol Screen: Not on file  Housing: Low Risk  (03/20/2024)   Epic    Unable to Pay for Housing in the Last Year: No    Number of Times Moved in the Last Year: 0    Homeless in the Last Year: No  Utilities: At Risk (03/20/2024)   Epic    Threatened with loss of utilities: Yes  Health Literacy: Not on file    Family History:  Family History  Problem Relation Age of Onset   Cervical cancer Mother    Stroke Father    Breast cancer Sister    Breast cancer Sister    Breast cancer Sister    Heart attack Brother    Stroke Maternal Grandmother    Heart attack Maternal Grandmother     Medications:  Medications Ordered Prior to Encounter[1]  Allergies:  Allergies[2]    OBJECTIVE:  Physical  Exam  There were no vitals filed for this visit. There is no height or weight on file to calculate BMI. No results found.   General: well developed, well nourished, seated, in no evident distress Head: head normocephalic and atraumatic.   Neck: supple with no carotid or supraclavicular bruits Cardiovascular: regular rate and rhythm, no murmurs Musculoskeletal: no deformity Skin:  no rash/petichiae Vascular:  Normal pulses all extremities   Neurologic Exam Mental Status: Awake and fully alert. Oriented to place and time. Recent and remote memory intact. Attention span, concentration and fund of knowledge appropriate. Mood and affect appropriate.  Cranial Nerves: Fundoscopic exam reveals sharp disc margins. Pupils equal, briskly reactive to light. Extraocular movements full without nystagmus. Visual fields full to confrontation. Hearing intact. Facial sensation intact. Face, tongue, palate moves normally and symmetrically.  Motor: Normal bulk and tone. Normal strength in all tested extremity muscles Sensory.: intact to touch , pinprick , position and vibratory sensation.  Coordination: Rapid alternating movements normal in all extremities. Finger-to-nose and heel-to-shin performed accurately bilaterally. Gait and Station: Arises from  chair without difficulty. Stance is normal. Gait demonstrates normal stride length and balance with ***. Tandem walk and heel toe ***.  Reflexes: 1+ and symmetric. Toes downgoing.     NIHSS  *** Modified Rankin  ***      ASSESSMENT: Britani Beattie is a 43 y.o. year old female ***. Vascular risk factors include ***.      PLAN:  *** :  Residual deficit: ***.  Work up: [employer] negative, zio patch negative, TTE nonrevealing, CTA head/neck advanced intracranial posterior circulation arthrosclerosis and chronically occluded right PCA Continue {anticoagluation:28091} and {statin:28096} for secondary stroke prevention managed/prescribed by PCP.   Discussed secondary stroke prevention measures and importance of close PCP follow up for aggressive stroke risk factor management including BP goal<130/90, HLD with LDL goal<70 and DM with A1c.<7 .  Stroke labs ***: LDL ***, A1c *** I have gone over the pathophysiology of stroke, warning signs and symptoms, risk factors and their management in some detail with instructions to go to the closest emergency room for symptoms of concern.     Follow up in *** or call earlier if needed   CC:  [facility] provider: Dr. Rosemarie PCP: Patient, No Pcp Per    I personally spent a total of *** minutes in the care of the patient today including {Time Based Coding:210964241}.    Harlene Bogaert, AGNP-BC  [facility] [address] [place], [place] [address]  Phone [phone] Fax [phone] Note: This document was prepared with digital dictation and possible smart phrase technology. Any transcriptional errors that result from this process are unintentional.         [1]  Current Outpatient Medications on File Prior to Visit  Medication Sig Dispense Refill   amLODipine  (NORVASC ) 10 MG tablet Take 1 tablet (10 mg total) by mouth daily. For BP 90 tablet 0   aspirin  EC 81 MG tablet Take 1 tablet (81 mg  total) by mouth daily with breakfast. - For stroke prevention 100 tablet 3   atorvastatin  (LIPITOR) 20 MG tablet Take 1 tablet (20 mg total) by mouth daily. 30 tablet 0   No current facility-administered medications on file prior to visit.  [2]  Allergies Allergen Reactions   Celexa  [Citalopram  Hydrobromide] Anxiety   "

## 2024-07-13 ENCOUNTER — Ambulatory Visit: Payer: Self-pay | Admitting: Adult Health

## 2024-07-13 ENCOUNTER — Encounter: Payer: Self-pay | Admitting: Adult Health

## 2024-07-13 VITALS — BP 169/90 | HR 70 | Ht 62.0 in | Wt 251.2 lb

## 2024-07-13 DIAGNOSIS — Z1329 Encounter for screening for other suspected endocrine disorder: Secondary | ICD-10-CM | POA: Diagnosis not present

## 2024-07-13 DIAGNOSIS — I63531 Cerebral infarction due to unspecified occlusion or stenosis of right posterior cerebral artery: Secondary | ICD-10-CM | POA: Diagnosis not present

## 2024-07-13 DIAGNOSIS — E538 Deficiency of other specified B group vitamins: Secondary | ICD-10-CM

## 2024-07-13 DIAGNOSIS — E611 Iron deficiency: Secondary | ICD-10-CM | POA: Diagnosis not present

## 2024-07-13 DIAGNOSIS — D518 Other vitamin B12 deficiency anemias: Secondary | ICD-10-CM

## 2024-07-13 DIAGNOSIS — G4733 Obstructive sleep apnea (adult) (pediatric): Secondary | ICD-10-CM

## 2024-07-13 DIAGNOSIS — R7309 Other abnormal glucose: Secondary | ICD-10-CM

## 2024-07-13 DIAGNOSIS — E785 Hyperlipidemia, unspecified: Secondary | ICD-10-CM

## 2024-07-13 NOTE — Patient Instructions (Addendum)
 Referral placed to home health physical, occupational and speech therapy  Will check labs today, you will be notified tomorrow regarding results  Order will be placed to complete a home sleep study - please ensure you do not use your CPAP during this testing.   Continue aspirin  81 mg daily for secondary stroke prevention. Based on cholesterol levels, will send in updated prescription for atorvastatin   Continue to monitor blood pressure at home. Please establish care with a primary care provider (PCP) for ongoing monitoring and management   Maintain strict control of hypertension with blood pressure goal below 130/90, diabetes with hemoglobin A1c goal below 7.0 % and cholesterol with LDL cholesterol (bad cholesterol) goal below 70 mg/dL.   Signs of a Stroke? Follow the BEFAST method:  Balance Watch for a sudden loss of balance, trouble with coordination or vertigo Eyes Is there a sudden loss of vision in one or both eyes? Or double vision?  Face: Ask the person to smile. Does one side of the face droop or is it numb?  Arms: Ask the person to raise both arms. Does one arm drift downward? Is there weakness or numbness of a leg? Speech: Ask the person to repeat a simple phrase. Does the speech sound slurred/strange? Is the person confused ? Time: If you observe any of these signs, call 911.         Thank you for coming to see us  at [facility]. I hope we have been able to provide you high quality care today.  You may receive a patient satisfaction survey over the next few weeks. We would appreciate your feedback and comments so that we may continue to improve ourselves and the health of our patients.

## 2024-07-14 ENCOUNTER — Telehealth: Payer: Self-pay | Admitting: Adult Health

## 2024-07-14 ENCOUNTER — Ambulatory Visit: Payer: Self-pay | Admitting: Adult Health

## 2024-07-14 LAB — COMPREHENSIVE METABOLIC PANEL WITH GFR
ALT: 17 IU/L (ref 0–32)
AST: 19 IU/L (ref 0–40)
Albumin: 4.4 g/dL (ref 3.9–4.9)
Alkaline Phosphatase: 116 IU/L (ref 41–116)
BUN/Creatinine Ratio: 13 (ref 9–23)
BUN: 9 mg/dL (ref 6–24)
Bilirubin Total: 0.4 mg/dL (ref 0.0–1.2)
CO2: 25 mmol/L (ref 20–29)
Calcium: 9.7 mg/dL (ref 8.7–10.2)
Chloride: 100 mmol/L (ref 96–106)
Creatinine, Ser: 0.69 mg/dL (ref 0.57–1.00)
Globulin, Total: 3.1 g/dL (ref 1.5–4.5)
Glucose: 143 mg/dL — ABNORMAL HIGH (ref 70–99)
Potassium: 3.9 mmol/L (ref 3.5–5.2)
Sodium: 141 mmol/L (ref 134–144)
Total Protein: 7.5 g/dL (ref 6.0–8.5)
eGFR: 109 mL/min/1.73

## 2024-07-14 LAB — HEMOGLOBIN A1C
Est. average glucose Bld gHb Est-mCnc: 117 mg/dL
Hgb A1c MFr Bld: 5.7 % — ABNORMAL HIGH (ref 4.8–5.6)

## 2024-07-14 LAB — CBC WITH DIFFERENTIAL/PLATELET
Basophils Absolute: 0.1 x10E3/uL (ref 0.0–0.2)
Basos: 1 %
EOS (ABSOLUTE): 0.2 x10E3/uL (ref 0.0–0.4)
Eos: 2 %
Hematocrit: 43.1 % (ref 34.0–46.6)
Hemoglobin: 12.4 g/dL (ref 11.1–15.9)
Immature Grans (Abs): 0 x10E3/uL (ref 0.0–0.1)
Immature Granulocytes: 0 %
Lymphocytes Absolute: 2.3 x10E3/uL (ref 0.7–3.1)
Lymphs: 27 %
MCH: 21.5 pg — ABNORMAL LOW (ref 26.6–33.0)
MCHC: 28.8 g/dL — ABNORMAL LOW (ref 31.5–35.7)
MCV: 75 fL — ABNORMAL LOW (ref 79–97)
Monocytes Absolute: 0.5 x10E3/uL (ref 0.1–0.9)
Monocytes: 6 %
Neutrophils Absolute: 5.3 x10E3/uL (ref 1.4–7.0)
Neutrophils: 63 %
Platelets: 438 x10E3/uL (ref 150–450)
RBC: 5.77 x10E6/uL — ABNORMAL HIGH (ref 3.77–5.28)
RDW: 17.6 % — ABNORMAL HIGH (ref 11.7–15.4)
WBC: 8.4 x10E3/uL (ref 3.4–10.8)

## 2024-07-14 LAB — TSH: TSH: 2.14 u[IU]/mL (ref 0.450–4.500)

## 2024-07-14 LAB — LIPID PANEL
Chol/HDL Ratio: 3 ratio (ref 0.0–4.4)
Cholesterol, Total: 192 mg/dL (ref 100–199)
HDL: 64 mg/dL
LDL Chol Calc (NIH): 110 mg/dL — ABNORMAL HIGH (ref 0–99)
Triglycerides: 98 mg/dL (ref 0–149)
VLDL Cholesterol Cal: 18 mg/dL (ref 5–40)

## 2024-07-14 LAB — IRON,TIBC AND FERRITIN PANEL
Ferritin: 11 ng/mL — ABNORMAL LOW (ref 15–150)
Iron Saturation: 8 % — CL (ref 15–55)
Iron: 39 ug/dL (ref 27–159)
Total Iron Binding Capacity: 473 ug/dL — ABNORMAL HIGH (ref 250–450)
UIBC: 434 ug/dL — ABNORMAL HIGH (ref 131–425)

## 2024-07-14 LAB — B12 AND FOLATE PANEL
Folate: 5.5 ng/mL
Vitamin B-12: 469 pg/mL (ref 232–1245)

## 2024-07-14 MED ORDER — FERROUS SULFATE 325 (65 FE) MG PO TABS: 325.0000 mg | ORAL_TABLET | Freq: Every day | ORAL | 5 refills | Status: AC

## 2024-07-14 MED ORDER — ATORVASTATIN CALCIUM 40 MG PO TABS: 40.0000 mg | ORAL_TABLET | Freq: Every day | ORAL | 3 refills | Status: AC

## 2024-07-14 NOTE — Telephone Encounter (Signed)
 Her medicaid insurance isn't active right now, so she is self-pay. [facility] said they may be able help her, but out of pocket it is very expensive. Even when her medicaid is active, home health does not take medicaid, so she may need outpatient services.

## 2024-07-14 NOTE — Telephone Encounter (Signed)
 Can patient be notified regarding this? Thank you

## 2024-07-21 NOTE — Telephone Encounter (Signed)
 Thank you for contacting patient. Once medicaid approved, can place referral for out patient therapy as HH not covered. Thank you.
